# Patient Record
Sex: Female | Born: 1980 | Race: White | Hispanic: No | Smoking: Former smoker
Health system: Southern US, Community
[De-identification: ages and names within clinical notes are randomized; demographics above are authoritative.]

## PROBLEM LIST (undated history)

## (undated) DIAGNOSIS — J302 Other seasonal allergic rhinitis: Secondary | ICD-10-CM

## (undated) DIAGNOSIS — IMO0001 Reserved for inherently not codable concepts without codable children: Secondary | ICD-10-CM

## (undated) DIAGNOSIS — D6851 Activated protein C resistance: Secondary | ICD-10-CM

## (undated) DIAGNOSIS — D279 Benign neoplasm of unspecified ovary: Secondary | ICD-10-CM

## (undated) DIAGNOSIS — D759 Disease of blood and blood-forming organs, unspecified: Secondary | ICD-10-CM

## (undated) DIAGNOSIS — N809 Endometriosis, unspecified: Secondary | ICD-10-CM

## (undated) DIAGNOSIS — H53469 Homonymous bilateral field defects, unspecified side: Secondary | ICD-10-CM

## (undated) DIAGNOSIS — I639 Cerebral infarction, unspecified: Secondary | ICD-10-CM

## (undated) DIAGNOSIS — Z9889 Other specified postprocedural states: Secondary | ICD-10-CM

## (undated) DIAGNOSIS — R112 Nausea with vomiting, unspecified: Secondary | ICD-10-CM

## (undated) DIAGNOSIS — R55 Syncope and collapse: Secondary | ICD-10-CM

## (undated) DIAGNOSIS — T4145XA Adverse effect of unspecified anesthetic, initial encounter: Secondary | ICD-10-CM

## (undated) DIAGNOSIS — F419 Anxiety disorder, unspecified: Secondary | ICD-10-CM

## (undated) DIAGNOSIS — J189 Pneumonia, unspecified organism: Secondary | ICD-10-CM

## (undated) DIAGNOSIS — T8859XA Other complications of anesthesia, initial encounter: Secondary | ICD-10-CM

## (undated) HISTORY — DX: Endometriosis, unspecified: N80.9

## (undated) HISTORY — DX: Activated protein C resistance: D68.51

## (undated) HISTORY — DX: Benign neoplasm of unspecified ovary: D27.9

## (undated) HISTORY — DX: Homonymous bilateral field defects, unspecified side: H53.469

## (undated) HISTORY — DX: Cerebral infarction, unspecified: I63.9

---
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
## 2002-04-09 HISTORY — PX: BREAST LUMPECTOMY: SHX2

## 2004-02-20 ENCOUNTER — Emergency Department: Payer: Self-pay | Admitting: Emergency Medicine

## 2004-05-08 ENCOUNTER — Ambulatory Visit: Payer: Self-pay | Admitting: Unknown Physician Specialty

## 2005-08-20 ENCOUNTER — Ambulatory Visit: Payer: Self-pay | Admitting: Gynecology

## 2005-12-27 ENCOUNTER — Ambulatory Visit: Payer: Self-pay | Admitting: Gynecology

## 2007-04-10 HISTORY — PX: TONSILLECTOMY: SUR1361

## 2008-01-05 ENCOUNTER — Ambulatory Visit: Payer: Self-pay | Admitting: Obstetrics & Gynecology

## 2008-01-06 ENCOUNTER — Ambulatory Visit: Payer: Self-pay | Admitting: Obstetrics & Gynecology

## 2008-04-09 HISTORY — PX: OTHER SURGICAL HISTORY: SHX169

## 2009-05-25 ENCOUNTER — Ambulatory Visit: Payer: Self-pay | Admitting: Unknown Physician Specialty

## 2009-05-26 ENCOUNTER — Inpatient Hospital Stay: Payer: Self-pay | Admitting: Unknown Physician Specialty

## 2009-06-24 ENCOUNTER — Ambulatory Visit: Payer: Self-pay | Admitting: Pediatrics

## 2010-01-17 ENCOUNTER — Encounter: Payer: Self-pay | Admitting: Cardiovascular Disease

## 2010-04-04 ENCOUNTER — Encounter: Payer: Self-pay | Admitting: Cardiovascular Disease

## 2010-04-06 ENCOUNTER — Encounter: Payer: Self-pay | Admitting: Cardiovascular Disease

## 2010-04-06 ENCOUNTER — Ambulatory Visit
Admission: RE | Admit: 2010-04-06 | Discharge: 2010-04-06 | Payer: Self-pay | Source: Home / Self Care | Attending: Cardiovascular Disease | Admitting: Cardiovascular Disease

## 2010-04-06 DIAGNOSIS — Z8673 Personal history of transient ischemic attack (TIA), and cerebral infarction without residual deficits: Secondary | ICD-10-CM | POA: Insufficient documentation

## 2010-04-09 HISTORY — PX: OTHER SURGICAL HISTORY: SHX169

## 2010-04-20 ENCOUNTER — Ambulatory Visit: Admission: RE | Admit: 2010-04-20 | Discharge: 2010-04-20 | Payer: Self-pay | Source: Home / Self Care

## 2010-04-20 ENCOUNTER — Other Ambulatory Visit: Payer: Self-pay | Admitting: Cardiovascular Disease

## 2010-04-20 ENCOUNTER — Encounter: Payer: Self-pay | Admitting: Cardiovascular Disease

## 2010-05-11 NOTE — Letter (Signed)
Summary: [facility]  [facility]   Imported By: Marylou Mccoy 04/20/2010 15:29:04  _____________________________________________________________________  External Attachment:    Type:   Image     Comment:   External Document
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
## 2010-05-25 NOTE — Consult Note (Signed)
Summary: [facility] Referral Form   [facility] Referral Form   Imported By: Roderic Ovens 05/18/2010 09:57:55  _____________________________________________________________________  External Attachment:    Type:   Image     Comment:   External Document

## 2010-10-20 ENCOUNTER — Encounter: Payer: Self-pay | Admitting: Cardiovascular Disease

## 2011-11-19 ENCOUNTER — Ambulatory Visit: Payer: Self-pay | Admitting: Internal Medicine

## 2011-11-23 ENCOUNTER — Ambulatory Visit: Payer: Self-pay | Admitting: Unknown Physician Specialty

## 2011-12-09 ENCOUNTER — Ambulatory Visit: Payer: Self-pay | Admitting: Internal Medicine

## 2012-04-09 HISTORY — PX: NASAL SINUS SURGERY: SHX719

## 2012-06-25 ENCOUNTER — Emergency Department: Payer: Self-pay

## 2013-04-09 HISTORY — PX: ENDOMETRIAL ABLATION: SHX621

## 2013-07-08 ENCOUNTER — Ambulatory Visit: Payer: Self-pay | Admitting: Obstetrics and Gynecology

## 2013-07-08 LAB — CBC
HCT: 39.9 % (ref 35.0–47.0)
HGB: 13.2 g/dL (ref 12.0–16.0)
MCH: 28.7 pg (ref 26.0–34.0)
MCHC: 33 g/dL (ref 32.0–36.0)
MCV: 87 fL (ref 80–100)
Platelet: 313 10*3/uL (ref 150–440)
RBC: 4.59 10*6/uL (ref 3.80–5.20)
RDW: 13.9 % (ref 11.5–14.5)
WBC: 6.1 10*3/uL (ref 3.6–11.0)

## 2013-07-13 ENCOUNTER — Ambulatory Visit: Payer: Self-pay | Admitting: Obstetrics and Gynecology

## 2013-07-15 LAB — PATHOLOGY REPORT

## 2014-07-31 NOTE — Op Note (Signed)
PATIENT NAME:  Karen Marsh, Karen Marsh MR#:  [id number] DATE OF BIRTH:  September 22, 1980  DATE OF PROCEDURE:  07/13/2013  PROCEDURE: 1.  Laparoscopy with peritoneal biopsies.  2.  Laparoscopic adhesiolysis.  3.  Hysteroscopy, dilation and curettage of the endometrium and NovaSure endometrial ablation.   SURGEON: Sharon Seller, MD.   FIRST ASSISTANT: Cordelia Pen, PA-S.   ANESTHESIA: General endotracheal.   INDICATIONS: The patient is a 34 year old, married, white female, para 2-0-0-2, who presents for surgical evaluation of chronic pelvic pain and menorrhagia. The patient has long standing history of severe dysmenorrhea and deep thrusting dyspareunia. She has heavy periods that are unresponsive to medical therapy including Mirena IUD. The patient is status post RSO for a dermoid cyst. She also had 2 previous cesarean sections.   FINDINGS AT SURGERY: Reveals gynecoid pelvis. The uterus did descend to within 4 cm of the introitus.   Laparoscopy demonstrated dense pelvic adhesions between the uterus and the anterior abdominal wall; these were lysed using the Ace Harmonic scalpel. The peritoneal adhesions also were taken down to restore a somewhat normal anatomy. The bladder was filled with saline to verify integrity. The left tube and ovary were normal. The right tube was normal. There was absence of the right ovary consistent with previous oophorectomy for a dermoid cyst. The cul-de-sac peritoneum was very hyperemic. There was a powder burn implant on the left anterior abdominal wall in the region of some adhesions. Two biopsies were taken in this area.   On hysteroscopy, there was a Secretory type endometrium present. Curettings were sent to pathology. NovaSure ablation did demonstrate an excellent thermal burn.   DESCRIPTION OF PROCEDURE: The patient was brought to the operating room where she was placed in the supine position. General endotracheal anesthesia was induced without difficulty. She was  placed in the dorsal lithotomy position using the bumblebee stirrups. A ChloraPrep and Betadine abdominal, perineal, intravaginal prep and drape was performed in standard fashion. Red Robinson catheter was used to drain 300 mL of urine from the bladder. A Hulka tenaculum was placed onto the anterior lip of the cervix to facilitate uterine manipulation. Vertical incision 5 mm in length was made in the subumbilical region. The Optiview laparoscopic trocar system was used to place a 5 mm port into the abdominal pelvic cavity under direct visualization. No bowel or vascular injury was encountered. A second 5 mm port was placed in the right lower quadrant under direct visualization. The above-noted findings were photo documented. The Ace Harmonic scalpel was used to take down the uterine/anterior abdominal wall adhesions. Bladder was also taken down with filmy adhesion complexes. Anatomy was restored to somewhat normal. The bladder was filled with saline to verify integrity. A powder burn implant on the left anterior abdominal wall was biopsied x 2. These were sent to pathology.   After completing the adhesiolysis and biopsies, after confirming hemostasis and after demonstrating that the upper abdomen was grossly normal, attention was turned to the vaginal part of the procedure. The Hulka tenaculum was removed and a single-tooth tenaculum was placed on the edge of the cervix. The uterus was sounded to 9 cm. The endocervical length was 4 cm. The endocervical canal was dilated using Hanks dilators up to an 8-French caliber. Both smooth and serrated curettes were used to perform the curettage. Hysteroscopy did demonstrate a good sampling of tissue. Following completion of the D and C, the NovaSure ablation instrument was placed intravaginally into the uterus in standard protocol. Cavity test was confirmed intact.  The NovaSure was performed with a duration of 1 minute and 23 seconds. Following completion of the procedure,  the instrument was removed and repeat hysteroscopy verified an excellent thermal effect. Once this was completed, all instrumentation was removed from the vagina. Attention was again turned to the anterior abdominal wall and repeat laparoscopy was performed with confirmation of hemostasis and anatomy restoration as previously noted. The procedure was then terminated with all instrumentation being removed from the abdominopelvic cavity. The pneumoperitoneum was released. The incisions were closed with 4-0 plain suture and Dermabond was placed over the incisions. The patient was then awakened, extubated and taken to the recovery room in satisfactory condition.   ESTIMATED BLOOD LOSS: Less than 25 mL.  IV FLUIDS: 900 mL.   URINE OUTPUT: 20 mL.   ____________________________ Prentice Docker. DeFrancesco, MD mad:aw D: 07/13/2013 09:23:00 ET T: 07/13/2013 09:36:59 ET JOB#: [id number]  cc: Daphine Deutscher A. DeFrancesco, MD, <Dictator> [facility] Prentice Docker DEFRANCESCO MD ELECTRONICALLY SIGNED 07/17/2013 4:43

## 2014-11-01 ENCOUNTER — Encounter: Payer: Self-pay | Admitting: Family Medicine

## 2014-11-01 ENCOUNTER — Ambulatory Visit (INDEPENDENT_AMBULATORY_CARE_PROVIDER_SITE_OTHER): Payer: BLUE CROSS/BLUE SHIELD | Admitting: Family Medicine

## 2014-11-01 VITALS — BP 107/69 | HR 77 | Temp 98.0°F | Resp 16 | Ht 61.0 in | Wt 131.6 lb

## 2014-11-01 DIAGNOSIS — Z7189 Other specified counseling: Secondary | ICD-10-CM

## 2014-11-01 DIAGNOSIS — Z7689 Persons encountering health services in other specified circumstances: Secondary | ICD-10-CM

## 2014-11-01 DIAGNOSIS — H938X2 Other specified disorders of left ear: Secondary | ICD-10-CM | POA: Diagnosis not present

## 2014-11-01 DIAGNOSIS — Z8673 Personal history of transient ischemic attack (TIA), and cerebral infarction without residual deficits: Secondary | ICD-10-CM | POA: Diagnosis not present

## 2014-11-01 DIAGNOSIS — D6851 Activated protein C resistance: Secondary | ICD-10-CM | POA: Diagnosis not present

## 2014-11-01 MED ORDER — OXYMETAZOLINE HCL 0.05 % NA SOLN: 1.0000 | Freq: Every day | NASAL | Status: AC

## 2014-11-01 NOTE — Patient Instructions (Signed)
Ear congestion: Trial of afrin at bedtime. Try for 3 nights.

## 2014-11-01 NOTE — Assessment & Plan Note (Addendum)
Continue aspirin daily. Only residual is no peripheral vision. Pt aware of warning signs and symptoms of stroke.  Lipid panel to stratify risk.

## 2014-11-01 NOTE — Progress Notes (Signed)
Subjective:    Patient ID: Karen Marsh, female    DOB: 03-24-1981, 34 y.o.   MRN: [id number]  HPI: Karen Marsh is a 34 y.o. female presenting on 11/01/2014 for Establish Care   HPI  Pt presents to establish care today. Previous PCP was Vonita Moss at [facility].   Current medical problems:  Endometriosis: Had an ablation with Dr. Darrow Bussing last year. Lighter bleeding and heavy cramping throughout the month. LMP: 7/21- light cycle occasional clots.  Stroke: Pt had an eye exam done and found to no peripheral vision 2011. She saw neurology and they determined she had a stroke at one point (possibly invitro).  Takes daily aspirin Factor V Leiden: Never had a clot. Found after stroke testing. Walks around when traveling. Stays hydrated when traveling. Aware of signs and symptoms of blood clot.  Anxiety: On Cymbalta- managed by Dr. Maryruth Bun- will be sent to Dr. Jenean Lindau in [place].   Ear pain: Seen at [facility] urgent care- given Augmentin and Flonase. Told eustachian tube was blocked. She is still hearing crackling and popping of the L ear.   Last pap smear: 2014- normal. Last done at [facility]. History of abnormal pap in 2002- all others have been normal.  Exercise: No formal exercise. 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   Past Medical History  Diagnosis Date  . Quadrantanopsia     R inferior   . Stroke     In past  . Factor V Leiden mutation   . Endometriosis   . Dermoid cyst of ovary     L   History   Social History  . Marital Status: Married    Spouse Name: N/A  . Number of Children: N/A  . Years of Education: N/A   Occupational History  . Not on file.   Social History Main Topics  . Smoking status: Former Smoker -- 0.25 packs/day for 2 years    Types: Cigarettes  . Smokeless tobacco: Never Used     Comment: quit in 2007   . Alcohol Use: Yes     Comment: 1 glass of wine/week   . Drug Use: No  .  Sexual Activity: Yes    Birth Control/ Protection: Surgical   Other Topics Concern  . Not on file   Social History Narrative   Married; full time; does not get regular exercise.    Family History  Problem Relation Age of Onset  . Diabetes Maternal Grandmother   . Cancer Maternal Grandmother     throat  . Cancer Maternal Grandfather     prostate  . Diabetes Maternal Grandfather   . Parkinson's disease Father    Current Outpatient Prescriptions on File Prior to Visit  Medication Sig  . aspirin 81 MG EC tablet Take 81 mg by mouth daily.    Marland Kitchen ibuprofen (ADVIL,MOTRIN) 800 MG tablet Take 800 mg by mouth daily.     No current facility-administered medications on file prior to visit.    Review of Systems  Constitutional: Negative for fever and chills.  HENT: Positive for ear pain. Negative for congestion, ear discharge and sinus pressure.   Respiratory: Negative for chest tightness, shortness of breath and stridor.   Cardiovascular: Negative for chest pain, palpitations and leg swelling.  Endocrine: Negative for cold intolerance, heat intolerance, polydipsia, polyphagia and polyuria.  Genitourinary: Positive for menstrual problem and pelvic pain (chronic endometriosis). Negative for dysuria, vaginal bleeding, vaginal discharge, vaginal  pain and dyspareunia.  Musculoskeletal: Negative.   Skin: Negative.   Neurological: Negative.  Negative for weakness, light-headedness and numbness.  Psychiatric/Behavioral: Negative.    Per HPI unless specifically indicated above     Objective:    BP 107/69 mmHg  Pulse 77  Temp(Src) 98 F (36.7 C) (Oral)  Resp 16  Ht 5\' 1"  (1.549 m)  Wt 131 lb 9.6 oz (59.693 kg)  BMI 24.88 kg/m2  LMP 10/28/2014  Wt Readings from Last 3 Encounters:  11/01/14 131 lb 9.6 oz (59.693 kg)  04/06/10 131 lb 8 oz (59.648 kg)    Physical Exam  Constitutional: She is oriented to person, place, and time. She appears well-developed and well-nourished.  HENT:   Head: Normocephalic and atraumatic.  Right Ear: Hearing and tympanic membrane normal. Tympanic membrane is not erythematous and not retracted.  Left Ear: Tympanic membrane is retracted. Tympanic membrane is not erythematous and not bulging.  Nose: Right sinus exhibits no maxillary sinus tenderness and no frontal sinus tenderness. Left sinus exhibits no maxillary sinus tenderness and no frontal sinus tenderness.  Neck: Neck supple.  Cardiovascular: Normal rate, regular rhythm and normal heart sounds.  Exam reveals no gallop and no friction rub.   No murmur heard. Pulmonary/Chest: Effort normal and breath sounds normal. She has no wheezes. She exhibits no tenderness.  Abdominal: Soft. Normal appearance and bowel sounds are normal. She exhibits no distension and no mass. There is no tenderness. There is no rebound and no guarding.  Musculoskeletal: Normal range of motion. She exhibits no edema or tenderness.  Lymphadenopathy:    She has no cervical adenopathy.  Neurological: She is alert and oriented to person, place, and time.  Skin: Skin is warm and dry.       Assessment & Plan:   Problem List Items Addressed This Visit      Hematopoietic and Hemostatic   Factor V Leiden mutation    Aware of s/s of DVT.       Relevant Orders   Comprehensive Metabolic Panel (CMET)     Other   History of stroke    Continue aspirin daily. Only residual is no peripheral vision. Pt aware of warning signs and symptoms of stroke.  Lipid panel to stratify risk.       Relevant Orders   Comprehensive Metabolic Panel (CMET)   Lipid Profile    Other Visit Diagnoses    Encounter to establish care    -  Primary    Will need well woman later this year.     Ear congestion, left        Likely residual eustachian tube dysfunction. Trial of decongestants and afrin.        Meds ordered this encounter  Medications  . fluticasone (FLONASE) 50 MCG/ACT nasal spray    Sig: Place into the nose.  Marland Kitchen LORazepam  (ATIVAN) 1 MG tablet    Sig: Take by mouth.  . DULoxetine (CYMBALTA) 30 MG capsule    Sig: Take by mouth daily.    Refill:  0  . oxymetazoline (AFRIN NASAL SPRAY) 0.05 % nasal spray    Sig: Place 1 spray into both nostrils at bedtime. Place 1 spray in the L nostril at bedtime and turn head to the left to allow medication to get into ear. Do for 3 nights only.    Dispense:  30 mL    Refill:  0    Order Specific Question:  Supervising Provider    Answer:  Janeann Forehand [[id number]]      Follow up plan: Return if symptoms worsen or fail to improve.

## 2014-11-01 NOTE — Assessment & Plan Note (Signed)
Aware of s/s of DVT.

## 2015-01-19 ENCOUNTER — Encounter: Payer: Self-pay | Admitting: *Deleted

## 2015-01-19 ENCOUNTER — Emergency Department: Payer: BLUE CROSS/BLUE SHIELD

## 2015-01-19 ENCOUNTER — Emergency Department
Admission: EM | Admit: 2015-01-19 | Discharge: 2015-01-20 | Disposition: A | Discharge status: Home / Self Care | Payer: BLUE CROSS/BLUE SHIELD | Attending: Emergency Medicine | Admitting: Emergency Medicine

## 2015-01-19 DIAGNOSIS — Z3202 Encounter for pregnancy test, result negative: Secondary | ICD-10-CM | POA: Insufficient documentation

## 2015-01-19 DIAGNOSIS — Z79899 Other long term (current) drug therapy: Secondary | ICD-10-CM | POA: Diagnosis not present

## 2015-01-19 DIAGNOSIS — Z7982 Long term (current) use of aspirin: Secondary | ICD-10-CM | POA: Insufficient documentation

## 2015-01-19 DIAGNOSIS — N83202 Unspecified ovarian cyst, left side: Secondary | ICD-10-CM | POA: Diagnosis not present

## 2015-01-19 DIAGNOSIS — R1022 Pelvic and perineal pain left side: Secondary | ICD-10-CM

## 2015-01-19 DIAGNOSIS — R1032 Left lower quadrant pain: Secondary | ICD-10-CM

## 2015-01-19 DIAGNOSIS — Z87891 Personal history of nicotine dependence: Secondary | ICD-10-CM | POA: Insufficient documentation

## 2015-01-19 DIAGNOSIS — R102 Pelvic and perineal pain: Secondary | ICD-10-CM

## 2015-01-19 LAB — BASIC METABOLIC PANEL
ANION GAP: 6 (ref 5–15)
BUN: 12 mg/dL (ref 6–20)
CO2: 27 mmol/L (ref 22–32)
Calcium: 9.4 mg/dL (ref 8.9–10.3)
Chloride: 106 mmol/L (ref 101–111)
Creatinine, Ser: 0.62 mg/dL (ref 0.44–1.00)
GFR calc non Af Amer: >60 mL/min (ref >60)
Glucose, Bld: 112 mg/dL — ABNORMAL HIGH (ref 65–99)
POTASSIUM: 4.3 mmol/L (ref 3.5–5.1)
Sodium: 139 mmol/L (ref 135–145)

## 2015-01-19 LAB — URINALYSIS COMPLETE WITH MICROSCOPIC (ARMC ONLY)
BILIRUBIN URINE: NEGATIVE
Bacteria, UA: NONE SEEN
Glucose, UA: NEGATIVE mg/dL
Hgb urine dipstick: NEGATIVE
Nitrite: NEGATIVE
Protein, ur: NEGATIVE mg/dL
Specific Gravity, Urine: 1.014 (ref 1.005–1.030)
pH: 6 (ref 5.0–8.0)

## 2015-01-19 LAB — WET PREP, GENITAL
CLUE CELLS WET PREP: NONE SEEN
TRICH WET PREP: NONE SEEN
YEAST WET PREP: NONE SEEN

## 2015-01-19 LAB — CBC WITH DIFFERENTIAL/PLATELET
BASOS PCT: 1 %
Basophils Absolute: 0.1 10*3/uL (ref 0–0.1)
Eosinophils Absolute: 0.3 10*3/uL (ref 0–0.7)
Eosinophils Relative: 3 %
HEMATOCRIT: 40 % (ref 35.0–47.0)
HEMOGLOBIN: 13.5 g/dL (ref 12.0–16.0)
LYMPHS PCT: 30 %
Lymphs Abs: 2.9 10*3/uL (ref 1.0–3.6)
MCH: 29.8 pg (ref 26.0–34.0)
MCHC: 33.7 g/dL (ref 32.0–36.0)
MCV: 88.2 fL (ref 80.0–100.0)
Monocytes Absolute: 0.6 10*3/uL (ref 0.2–0.9)
Monocytes Relative: 7 %
NEUTROS PCT: 59 %
Neutro Abs: 5.7 10*3/uL (ref 1.4–6.5)
Platelets: 291 10*3/uL (ref 150–440)
RBC: 4.53 MIL/uL (ref 3.80–5.20)
RDW: 13.2 % (ref 11.5–14.5)
WBC: 9.5 10*3/uL (ref 3.6–11.0)

## 2015-01-19 LAB — POCT PREGNANCY, URINE: Preg Test, Ur: NEGATIVE

## 2015-01-19 NOTE — ED Notes (Signed)
Pt has left lower abd pain for 5 days.  No urinary sx.  No vag bleeding or vag discharge.

## 2015-01-19 NOTE — ED Notes (Signed)
poct pregnancy Negative 

## 2015-01-19 NOTE — ED Provider Notes (Signed)
[facility] Emergency Department Provider Note   ____________________________________________  Time seen: 2030  I have reviewed the triage vital signs and the nursing notes.   HISTORY  Chief Complaint Abdominal Pain   History limited by: Not Limited   HPI Karen Marsh is a 34 y.o. female who presents to the emergency department today with left lower abdominal pain.she states that this is been going on for roughly 5 days. It does come and go. She states that when it does come it becomes sharp and intense. The patient denies any abnormal vaginal discharge. She denies any dysuria or foul odor to her urine. She denies any abnormal vaginal discharge. She does state that she will get ovulation pain which this sort of reminds her of however this does not respond to over-the-counter pain medications like her normal pain. She states she is status post a right oophorectomy.   Past Medical History  Diagnosis Date  . Quadrantanopsia     R inferior   . Stroke [facility])     In past  . Factor V Leiden mutation ([place])   . Endometriosis   . Dermoid cyst of ovary     L    Patient Active Problem List   Diagnosis Date Noted  . Factor V Leiden mutation ([place]) 11/01/2014  . History of stroke 04/06/2010    Past Surgical History  Procedure Laterality Date  . Dilation and curettage of uterus  2000  . Tonsillectomy  2009  . Cesarean section  2002 and 2011  . Breast lumpectomy  2003    R breast  . Oophrectomy Right 2010  . Endometrial ablation  2015    Current Outpatient Rx  Name  Route  Sig  Dispense  Refill  . aspirin 81 MG EC tablet   Oral   Take 81 mg by mouth daily.           . DULoxetine (CYMBALTA) 30 MG capsule   Oral   Take by mouth daily.      0   . fluticasone (FLONASE) 50 MCG/ACT nasal spray   Nasal   Place into the nose.         . ibuprofen (ADVIL,MOTRIN) 800 MG tablet   Oral   Take 800 mg by mouth daily.           Marland Kitchen LORazepam (ATIVAN)  1 MG tablet   Oral   Take by mouth.           Allergies Azithromycin and Codeine  Family History  Problem Relation Age of Onset  . Diabetes Maternal Grandmother   . Cancer Maternal Grandmother     throat  . Cancer Maternal Grandfather     prostate  . Diabetes Maternal Grandfather   . Parkinson's disease Father     Social History Social History  Substance Use Topics  . Smoking status: Former Smoker -- 0.25 packs/day for 2 years    Types: Cigarettes  . Smokeless tobacco: Never Used     Comment: quit in 2007   . Alcohol Use: Yes     Comment: 1 glass of wine/week     Review of Systems  Constitutional: Negative for fever. Cardiovascular: Negative for chest pain. Respiratory: Negative for shortness of breath. Gastrointestinal: left lower quadrant pain. Genitourinary: Negative for dysuria. Musculoskeletal: Negative for back pain. Skin: Negative for rash. Neurological: Negative for headaches, focal weakness or numbness.   10-point ROS otherwise negative.  ____________________________________________   PHYSICAL EXAM:  VITAL SIGNS:  ED Triage Vitals  Enc Vitals Group     BP 01/19/15 2100 117/76 mmHg     Pulse Rate 01/19/15 2100 85     Resp 01/19/15 2100 20     Temp 01/19/15 2100 99.1 F (37.3 C)     Temp Source 01/19/15 2100 Oral     SpO2 01/19/15 2100 99 %     Weight 01/19/15 2100 123 lb (55.792 kg)     Height 01/19/15 2100 5\' 1"  (1.549 m)     Head Cir --      Peak Flow --      Pain Score 01/19/15 2101 7   Constitutional: Alert and oriented. Well appearing and in no distress. Eyes: Conjunctivae are normal. PERRL. Normal extraocular movements. ENT   Head: Normocephalic and atraumatic.   Nose: No congestion/rhinnorhea.   Mouth/Throat: Mucous membranes are moist.   Neck: No stridor. Hematological/Lymphatic/Immunilogical: No cervical lymphadenopathy. Cardiovascular: Normal rate, regular rhythm.  No murmurs, rubs, or gallops. Respiratory:  Normal respiratory effort without tachypnea nor retractions. Breath sounds are clear and equal bilaterally. No wheezes/rales/rhonchi. Gastrointestinal: Soft and minimally tender in the left lower quadrant. No rebound. No guarding. Genitourinary: no external lesions. No blood in the vaginal vault. No abnormal discharge. No CMT. No left adnexal fullness. Positive left adnexal tenderness. Musculoskeletal: Normal range of motion in all extremities. No joint effusions.  No lower extremity tenderness nor edema. Neurologic:  Normal speech and language. No gross focal neurologic deficits are appreciated. Speech is normal.  Skin:  Skin is warm, dry and intact. No rash noted. Psychiatric: Mood and affect are normal. Speech and behavior are normal. Patient exhibits appropriate insight and judgment.  ____________________________________________    LABS (pertinent positives/negatives)  Labs Reviewed  BASIC METABOLIC PANEL - Abnormal; Notable for the following:    Glucose, Bld 112 (*)    All other components within normal limits  URINALYSIS COMPLETEWITH MICROSCOPIC ([facility] ONLY) - Abnormal; Notable for the following:    Color, Urine YELLOW (*)    APPearance CLEAR (*)    Ketones, ur TRACE (*)    Leukocytes, UA TRACE (*)    Squamous Epithelial / LPF 0-5 (*)    All other components within normal limits  CBC WITH DIFFERENTIAL/PLATELET  POC URINE PREG, ED  POCT PREGNANCY, URINE     ____________________________________________   EKG  None  ____________________________________________    RADIOLOGY  Ultrasound pending ____________________________________________   PROCEDURES  Procedure(s) performed: None  Critical Care performed: No  ____________________________________________   INITIAL IMPRESSION / ASSESSMENT AND PLAN / ED COURSE  Pertinent labs & imaging results that were available during my care of the patient were reviewed by me and considered in my medical decision making (see  chart for details).  Patient presented to the emergency department today with concerns for left lower quadrant abdominal pain. Patient is status post right oophorectomy. Blood work does not show any leukocytosis. Additionally urine without any concerning findings. On pelvic exam patient did have some left adnexal tenderness. No CMT. Will plan on obtaining an ultrasound to look for ovarian service person other ovarian pathology.  ____________________________________________   FINAL CLINICAL IMPRESSION(S) / ED DIAGNOSES  Final diagnoses:  Left adnexal tenderness  Left lower quadrant pain     Nance Pear, MD 01/19/15 2338

## 2015-01-20 LAB — CHLAMYDIA/NGC RT PCR (ARMC ONLY)
Chlamydia Tr: NOT DETECTED
N gonorrhoeae: NOT DETECTED

## 2015-01-20 MED ORDER — IBUPROFEN 600 MG PO TABS
600.0000 mg | ORAL_TABLET | Freq: Once | ORAL | Status: AC
  Administered 2015-01-20: 600 mg via ORAL
  Filled 2015-01-20: qty 1

## 2015-01-20 MED ORDER — OXYCODONE-ACETAMINOPHEN 5-325 MG PO TABS
1.0000 | ORAL_TABLET | Freq: Once | ORAL | Status: AC
  Administered 2015-01-20: 1 via ORAL
  Filled 2015-01-20: qty 1

## 2015-01-20 MED ORDER — IBUPROFEN 600 MG PO TABS: 600.0000 mg | ORAL_TABLET | Freq: Three times a day (TID) | ORAL | Status: DC | PRN

## 2015-01-20 MED ORDER — OXYCODONE-ACETAMINOPHEN 5-325 MG PO TABS: 1.0000 | ORAL_TABLET | ORAL | Status: DC | PRN

## 2015-01-20 NOTE — Discharge Instructions (Signed)
1. Take pain medicines as needed (Motrin/Percocet #15). 2. Return to the ER for worsening symptoms, persistent vomiting, heavy vaginal bleeding or other concerns.

## 2015-01-20 NOTE — ED Provider Notes (Signed)
-----------------------------------------   1:42 AM on 01/20/2015 -----------------------------------------  Pelvic ultrasound interpreted per Dr. Gerilyn Nestle: Retroverted uterus with small uterine fibroid. Right ovary surgically absent. Heterogeneous hypoechoic nodule adjacent to the left ovary measuring 1.9 cm. This may represent a hemorrhagic cyst or dermoid. No evidence of ovarian torsion.  Updated patient and her sister ultrasound findings. Will prescribe NSAIDs and analgesia and patient will follow-up with her gynecologist. Strict return precautions given. Both verbalize understanding and agree with plan of care.   Paulette Blanch, MD 01/20/15 [phone]

## 2015-01-20 NOTE — ED Notes (Signed)
Reviewed d/c instructions, prescriptions and follow-up care with pt. Pt verbalized understanding.

## 2015-01-25 ENCOUNTER — Ambulatory Visit (INDEPENDENT_AMBULATORY_CARE_PROVIDER_SITE_OTHER): Payer: BLUE CROSS/BLUE SHIELD | Admitting: Obstetrics and Gynecology

## 2015-01-25 ENCOUNTER — Encounter: Payer: Self-pay | Admitting: Obstetrics and Gynecology

## 2015-01-25 VITALS — BP 116/81 | HR 85 | Ht 61.0 in | Wt 121.4 lb

## 2015-01-25 DIAGNOSIS — Z9889 Other specified postprocedural states: Secondary | ICD-10-CM

## 2015-01-25 DIAGNOSIS — N809 Endometriosis, unspecified: Secondary | ICD-10-CM | POA: Diagnosis not present

## 2015-01-25 DIAGNOSIS — N946 Dysmenorrhea, unspecified: Secondary | ICD-10-CM

## 2015-01-25 MED ORDER — OXYCODONE-ACETAMINOPHEN 5-325 MG PO TABS: 1.0000 | ORAL_TABLET | Freq: Four times a day (QID) | ORAL | Status: DC | PRN

## 2015-01-25 NOTE — Progress Notes (Signed)
Patient ID: Karen Marsh, female   DOB: 1980/11/01, 34 y.o.   MRN: [id number] ER f/u from 01/19/2015 for left side pain now around to back. Also knot noted of left breast (has been tender).  Ultrasound report: Retroverted uterus with small uterine fibroid. Right ovary surgically absent. Heterogeneous hypoechoic nodule adjacent to the left ovary measuring 1.9 cm. This may represent a hemorrhagic cyst or dermoid. No evidence of ovarian torsion.  Patient underwent laparoscopy with peritoneal biopsies, adhesiolysis, hysteroscopy/D&C and NovaSure ablation on 07/23/2013.  Procedures were done because the patient has long history of severe dysmenorrhea and deep thrusting dyspareunia.  She had heavy irregular periods, unresponsive to the Mirena IUD.  The patient previously had Rt Oophorectomy for dermoid cystIn 2009, in addition to 2 previous cesarean section deliveries. Findings from surgery included A gynecoid pelvis, uterine descensus to within 4 cm of the introitus, And a normal endometrial cavity.At laparoscopy, the patient had dense pelvic adhesions between the uterus and the anterior abdominal wall which were lysed with the Harmonic scalpel.  The left tube and ovary appeared normal.  The right tube was normal.  There was no evidence of a right ovary, consistent with history of right oophorectomy.  The cul-de-sac was hyperemic.  There were powder burn implants on the left anterior abdominal wall; biopsies were taken; pathology showed Endometriosis.  Patient states that her pain is intermittently up to 7 out of 10 in intensity.  She is having occasional light menses.  Patient also is complaining of a left breast knot.  Past medical history, past surgical history, problem list, allergies, and medications reviewed.  Review of systems: Per HPI.  OBJECTIVE: BP 116/81 mmHg  Pulse 85  Ht 5\' 1"  (1.549 m)  Wt 121 lb 6 oz (55.055 kg)  BMI 22.95 kg/m2  LMP 01/23/2015 Pleasant, well-appearing female in  no acute distress.  She is alert and oriented. Breasts: Bilaterally symmetric without dominant mass, adenopathy or nipple discharge. Lymph node survey: No supraclavicular or axillary adenopathy. Back: No CVA tenderness. Pelvic exam: External genitalia-normal BUS-normal. Vagina-without discharge; no lesions; good supports. Cervix-no lesions; mild cervical motion tenderness Uterus-midplane, tender 2/4, mobile. Adnexa-left-sided tenderness to/4 without palpable mass. RV-deferred.  IMPRESSION: 1.  Symptomatic endometriosis, worsening. 2.1.9 cm hypoechoic nodule, left adnexa, possibly hemorrhagic cyst versus endometrioma versus dermoid. 3.  Left breast tenderness with normal exam today. 4.  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 5.  Status post endometrial ablation  PLAN: 1. Recommend Depo-Lupron therapy. 2.  Percocet prescription for pain is given. 3.  Continue with Percocet and Motrin for pain relief. 4.  Patient is to consider LAVH LSO as definitive treatment for endometriosis; she is understanding of the increased risks with regard to thromboembolic disease due to factor V Leiden deficiency. 5.  Return in 4 weeks.  Brayton Mars, MD

## 2015-01-25 NOTE — Patient Instructions (Signed)
1.  Percocet prescription is given, 25, no refills. 2.  Continue using Motrin and Percocet as needed for pain relief. 3.  Literature regarding Depo-Lupron is given; recommend looking at [facility] website for details regarding endometriosis therapy. 4.  Return in 4 weeks or when necessary. 5.  Consider LAVH LSO as definitive treatment for endometriosis.

## 2015-01-30 DIAGNOSIS — N809 Endometriosis, unspecified: Secondary | ICD-10-CM | POA: Insufficient documentation

## 2015-01-30 DIAGNOSIS — N946 Dysmenorrhea, unspecified: Secondary | ICD-10-CM | POA: Insufficient documentation

## 2015-01-30 DIAGNOSIS — Z9889 Other specified postprocedural states: Secondary | ICD-10-CM | POA: Insufficient documentation

## 2015-02-08 DIAGNOSIS — R55 Syncope and collapse: Secondary | ICD-10-CM

## 2015-02-08 HISTORY — DX: Syncope and collapse: R55

## 2015-02-09 ENCOUNTER — Other Ambulatory Visit: Payer: BLUE CROSS/BLUE SHIELD

## 2015-02-09 ENCOUNTER — Ambulatory Visit (INDEPENDENT_AMBULATORY_CARE_PROVIDER_SITE_OTHER): Payer: BLUE CROSS/BLUE SHIELD | Admitting: General Surgery

## 2015-02-09 ENCOUNTER — Encounter: Payer: Self-pay | Admitting: General Surgery

## 2015-02-09 VITALS — BP 102/64 | HR 92 | Resp 12 | Ht 61.0 in | Wt 120.0 lb

## 2015-02-09 DIAGNOSIS — N6320 Unspecified lump in the left breast, unspecified quadrant: Secondary | ICD-10-CM

## 2015-02-09 DIAGNOSIS — N63 Unspecified lump in breast: Secondary | ICD-10-CM | POA: Diagnosis not present

## 2015-02-09 NOTE — Patient Instructions (Addendum)
The patient is aware to call back for any questions, concerns or change in breast symptoms. Follow up in 3 months

## 2015-02-09 NOTE — Progress Notes (Signed)
Patient ID: Karen Marsh, female   DOB: September 07, 1980, 34 y.o.   MRN: [id number]  Chief Complaint  Patient presents with  . Other    Left breast    HPI Karen Marsh is a 34 y.o. female here today for a evaluation of left breast mass. She states she noticed it about the last week of September. Her dog had stepped on her breast and she had noticed discomfort in the upper-outer quadrant. Shortly afterwards she became aware of a fullness in the 12:00 position of the breast, away from the area of her original trauma. She states it started out as just tenderness. She feels like it may be slightly larger before her periods than it does after her periods. Denies any injury or trauma.  She does self breast checks every other month. Denies family history of breast cancer. The patient has been having lower abdominal pain, and by report was identified with a 2 cm left ovarian cyst. She had previously undergone an endometrial ablation due to menorrhagia. She hs ben followed by Dr Haskel Khan for her lower abdominal pelvic pain with endometriosis  issues and he has recommended a partial hysterectomy. The patient is reportedly been identified as having a low level of factor V. When questioned about whether she was making use of anticoagulation, she reports that her neurologist had recommended this, but the cardiologist did not. There seemed to be a question between the different services about whether she should or shouldn't, and she elected just to make use of aspirin. In regards to the history of a stroke, there was no clinical evidence of the event, only CT evidence and the timing was unclear. Question of an in utero event was raised.. I'm not sure that the patient has been formally evaluated by hematology.  The patient has no known history of deep venous thrombosis. I personally reviewed the history with the patient. HPI  Past Medical History  Diagnosis Date  . Quadrantanopsia     R inferior   . Stroke  [facility])     In past  . Factor V Leiden mutation (HCC)   . Endometriosis   . Dermoid cyst of ovary     L    Past Surgical History  Procedure Laterality Date  . Dilation and curettage of uterus  2000  . Tonsillectomy  2009  . Cesarean section  2002 and 2011  . Oophrectomy Right 2010  . Endometrial ablation  2015  . Tummy tuck  2012  . Nasal sinus surgery  2014  . Breast lumpectomy Right 2004    R breast/ Dr Evette Cristal    Family History  Problem Relation Age of Onset  . Diabetes Maternal Grandmother   . Cancer Maternal Grandmother     throat  . Cancer Maternal Grandfather     prostate  . Diabetes Maternal Grandfather   . Parkinson's disease Father     Social History Social History  Substance Use Topics  . Smoking status: Former Smoker -- 0.25 packs/day for 2 years    Types: Cigarettes  . Smokeless tobacco: Never Used     Comment: quit in 2007   . Alcohol Use: Yes     Comment: 1 glass of wine/week     Allergies  Allergen Reactions  . Azithromycin Nausea And Vomiting  . Codeine     Current Outpatient Prescriptions  Medication Sig Dispense Refill  . amphetamine-dextroamphetamine (ADDERALL XR) 30 MG 24 hr capsule   0  . aspirin 81 MG  EC tablet Take 81 mg by mouth daily.      . DULoxetine (CYMBALTA) 30 MG capsule Take by mouth daily.  0  . ibuprofen (ADVIL,MOTRIN) 600 MG tablet Take 1 tablet (600 mg total) by mouth every 8 (eight) hours as needed. 15 tablet 0  . loratadine (CLARITIN) 10 MG tablet Take 10 mg by mouth daily.    Marland Kitchen oxyCODONE-acetaminophen (PERCOCET/ROXICET) 5-325 MG tablet Take 1-2 tablets by mouth every 6 (six) hours as needed for severe pain. 25 tablet 0  . zolpidem (AMBIEN) 5 MG tablet   2   No current facility-administered medications for this visit.    Review of Systems Review of Systems  Constitutional: Negative.   Respiratory: Negative.   Cardiovascular: Negative.     Blood pressure 102/64, pulse 92, resp. rate 12, height 5\' 1"  (1.549 m),  weight 120 lb (54.432 kg), last menstrual period 01/23/2015.  Physical Exam Physical Exam  Constitutional: She is oriented to person, place, and time. She appears well-developed and well-nourished.  HENT:  Mouth/Throat: Oropharynx is clear and moist.  Eyes: Conjunctivae are normal. No scleral icterus.  Neck: Neck supple.  Cardiovascular: Normal rate, regular rhythm and normal heart sounds.   Pulmonary/Chest: Effort normal and breath sounds normal. Right breast exhibits no inverted nipple, no mass, no nipple discharge, no skin change and no tenderness. Left breast exhibits tenderness. Left breast exhibits no inverted nipple, no mass, no nipple discharge and no skin change.    Fullness and tenderness at 12 o'clock left breast.  Lymphadenopathy:    She has no cervical adenopathy.    She has no axillary adenopathy.  Neurological: She is alert and oriented to person, place, and time.  Skin: Skin is warm and dry.  Psychiatric: Her behavior is normal.    Data Reviewed Ultrasound examination of the left breast in the area of palpable thickening, 12:00, 3 cm from the nipple showed a hypoechoic area with focal acoustic shadowing measuring 0.6 x 1.07 x 1.08. In the radial view this appeared to be incontinuity with an adjacent duct. No other abnormalities noted within the area. BI-RADS-3.  Assessment    Breast mass likely secondary to acute inflammatory process, low index of suspicion for malignancy.    Plan    Observations is warranted at this time. As she is having minimal discomfort, she's been encouraged to continue monthly self examinations and to avoid daily manipulation.  The patient has accessed ibuprofen, 600 mg tablets. While she's not found relief with when necessary use, she's been encouraged to make use of this 4 times a day for a week to see if this doesn't help resolve some of the soreness and speed resolution of an anticipated inflammatory process. She may have developed this  area and response to changes in hormones, not clearly evident secondary to previous endometrial ablation.  Should she develop any pain or appreciated enlargement in this area, she's been asked to return for early reassessment.    The patient is aware to call back for any questions, concerns or change in breast symptoms. Follow up in 3 months.     PCP:  Steele Sizer Ref Dr Catalina Pizza 02/10/2015, 5:45 PM

## 2015-02-24 ENCOUNTER — Ambulatory Visit: Payer: BLUE CROSS/BLUE SHIELD | Admitting: Obstetrics and Gynecology

## 2015-02-25 ENCOUNTER — Inpatient Hospital Stay: Admission: RE | Admit: 2015-02-25 | Payer: BLUE CROSS/BLUE SHIELD | Source: Ambulatory Visit

## 2015-02-25 ENCOUNTER — Encounter: Payer: Self-pay | Admitting: *Deleted

## 2015-02-25 ENCOUNTER — Other Ambulatory Visit: Payer: BLUE CROSS/BLUE SHIELD

## 2015-02-25 NOTE — Patient Instructions (Signed)
  Your procedure is scheduled on: 03-01-15 (TUESDAY) Report to [place] To find out your arrival time please call [phone] between 1PM - 3PM on 02-28-15(MONDAY)  Remember: Instructions that are not followed completely may result in serious medical risk, up to and including death, or upon the discretion of your surgeon and anesthesiologist your surgery may need to be rescheduled.    _X___ 1. Do not eat food or drink liquids after midnight. No gum chewing or hard candies.     _X___ 2. No Alcohol for 24 hours before or after surgery.   ____ 3. Bring all medications with you on the day of surgery if instructed.    _X___ 4. Notify your doctor if there is any change in your medical condition     (cold, fever, infections).     Do not wear jewelry, make-up, hairpins, clips or nail polish.  Do not wear lotions, powders, or perfumes. You may wear deodorant.  Do not shave 48 hours prior to surgery. Men may shave face and neck.  Do not bring valuables to the hospital.    [facility] is not responsible for any belongings or valuables.               Contacts, dentures or bridgework may not be worn into surgery.  Leave your suitcase in the car. After surgery it may be brought to your room.  For patients admitted to the hospital, discharge time is determined by your  treatment team.   Patients discharged the day of surgery will not be allowed to drive home.   Please read over the following fact sheets that you were given:      _X___ Take these medicines the morning of surgery with A SIP OF WATER:    1. CYMBALTA  2. MAY TAKE LORAZEPAM IF NEEDED DAY OF SURGERY  3.   4.  5.  6.  ____ Fleet Enema (as directed)   _X___ Use CHG Soap as directed  _X___ Use inhalers on the day of surgery-USE ALBUTEROL AT [place]   ____ Stop metformin 2 days prior to surgery    ____ Take 1/2 of usual insulin dose the night before surgery and none on the  morning of surgery.   ____ Stop Coumadin/Plavix/aspirin-CHECK WITH DR HARRIS ABOUT ASPIRIN  _X___ Stop Anti-inflammatories-STOP IBUPROFEN NOW-NO NSAIDS OR ASPIRIN PRODUCTS-TYLENOL/PERCOCET OK   ____ Stop supplements until after surgery.    ____ Bring C-Pap to the hospital.

## 2015-02-28 ENCOUNTER — Encounter: Payer: Self-pay | Admitting: Internal Medicine

## 2015-02-28 ENCOUNTER — Inpatient Hospital Stay: Payer: BLUE CROSS/BLUE SHIELD | Attending: Oncology | Admitting: Internal Medicine

## 2015-02-28 ENCOUNTER — Encounter
Admission: RE | Admit: 2015-02-28 | Discharge: 2015-02-28 | Disposition: A | Discharge status: Home / Self Care | Payer: BLUE CROSS/BLUE SHIELD | Source: Ambulatory Visit | Attending: Anesthesiology | Admitting: Anesthesiology

## 2015-02-28 VITALS — BP 116/82 | HR 87 | Temp 98.1°F | Resp 18 | Ht 62.0 in | Wt 120.8 lb

## 2015-02-28 DIAGNOSIS — N83209 Unspecified ovarian cyst, unspecified side: Secondary | ICD-10-CM | POA: Insufficient documentation

## 2015-02-28 DIAGNOSIS — H5340 Unspecified visual field defects: Secondary | ICD-10-CM | POA: Insufficient documentation

## 2015-02-28 DIAGNOSIS — Z87891 Personal history of nicotine dependence: Secondary | ICD-10-CM | POA: Diagnosis not present

## 2015-02-28 DIAGNOSIS — G8929 Other chronic pain: Secondary | ICD-10-CM | POA: Diagnosis not present

## 2015-02-28 DIAGNOSIS — Z888 Allergy status to other drugs, medicaments and biological substances status: Secondary | ICD-10-CM | POA: Diagnosis not present

## 2015-02-28 DIAGNOSIS — F419 Anxiety disorder, unspecified: Secondary | ICD-10-CM | POA: Insufficient documentation

## 2015-02-28 DIAGNOSIS — R0602 Shortness of breath: Secondary | ICD-10-CM | POA: Diagnosis not present

## 2015-02-28 DIAGNOSIS — Z885 Allergy status to narcotic agent status: Secondary | ICD-10-CM | POA: Diagnosis not present

## 2015-02-28 DIAGNOSIS — N8302 Follicular cyst of left ovary: Secondary | ICD-10-CM | POA: Diagnosis not present

## 2015-02-28 DIAGNOSIS — Z79899 Other long term (current) drug therapy: Secondary | ICD-10-CM | POA: Diagnosis not present

## 2015-02-28 DIAGNOSIS — R102 Pelvic and perineal pain: Secondary | ICD-10-CM | POA: Diagnosis present

## 2015-02-28 DIAGNOSIS — Z8673 Personal history of transient ischemic attack (TIA), and cerebral infarction without residual deficits: Secondary | ICD-10-CM | POA: Insufficient documentation

## 2015-02-28 DIAGNOSIS — Z820 Family history of epilepsy and other diseases of the nervous system: Secondary | ICD-10-CM | POA: Diagnosis not present

## 2015-02-28 DIAGNOSIS — D6851 Activated protein C resistance: Secondary | ICD-10-CM

## 2015-02-28 DIAGNOSIS — Z833 Family history of diabetes mellitus: Secondary | ICD-10-CM | POA: Diagnosis not present

## 2015-02-28 DIAGNOSIS — N838 Other noninflammatory disorders of ovary, fallopian tube and broad ligament: Secondary | ICD-10-CM | POA: Diagnosis not present

## 2015-02-28 LAB — ABO/RH: ABO/RH(D): A POS

## 2015-02-28 LAB — PROTIME-INR
INR: 0.99
Prothrombin Time: 13.3 seconds (ref 11.4–15.0)

## 2015-02-28 LAB — TYPE AND SCREEN
ABO/RH(D): A POS
ANTIBODY SCREEN: NEGATIVE

## 2015-02-28 LAB — APTT: APTT: 30 s (ref 24–36)

## 2015-02-28 NOTE — Progress Notes (Signed)
Pt has had 2 episodes of near syncope she states . She feels bad and sits down and asked someone to get her a drink and then she feels better.  She has her last menstrual cycle 11/11. Since her ablation her periods are much better..  She stopped her ASA on Friday of last week.  She stopped he ibuprofen last week early in the week.

## 2015-02-28 NOTE — Addendum Note (Signed)
Addended by: Roxana Hires on: 02/28/2015 05:46 PM   Modules accepted: Level of Service

## 2015-02-28 NOTE — Progress Notes (Signed)
Cancer Center @ [facility] [phone]  [phone]     ASLYN ARAKI OB: 04-10-1980  MR#: [id number]  AOZ#:[id number]  Patient Care Team: Steele Sizer, MD as PCP - General (Family Medicine) Rona Ravens, MD as Obstetrician (Unknown Physician Specialty) Earline Mayotte, MD as Surgeon (General Surgery)  CHIEF COMPLAINT:  Chief Complaint  Patient presents with  . factor V leiden  factor V Leiden heterozygosity   No history exists.    No flowsheet data found.  HISTORY OF PRESENT ILLNESS Mrs. Polter was accidentally found to have R peripheral visual field deficit during regular physical exam at the age of 50. The workup was consistent with consequences of occipital stroke, and the hypercoagulability workup was performed, showing heterozygosity for factor V Leiden (R506Q mutation December 2011). Additional workup showed no evidence of lupus anticoagulant, anticardiolipin antibodies, protein S and C deficiency, antithrombin III deficiency, homocystine elevation, prothrombin gene mutation. She does not have any history of DVT, both during 2 consecutive pregnancies, or after maxillary antrostomy in 2013. She is referred to our clinic on the very short notice prior to laparoscopic evaluation of left ovarian cyst. She already had a right ovary removed due to dermoid cyst She was previously seen in 2013 by Dr. Chipper Oman reviewed previously available results and confirmed the heterozygous state of factor V Leiden.  INTERVAL HISTORY:  Patient is scheduled to undergo laparoscopic procedure tomorrow for complaints of abdominal pain and findings of ovarian cyst. She stop taking aspirin last week. She denies any recent swelling of lower extremities, pain in lower extremities, shortness of breath, chest pain, cough, weakness or numbness in arms or legs. She also was evaluated recently for swelling of the left breast and will need additional imaging studies.  REVIEW OF  SYSTEMS:    As per HPI. Otherwise, a complete review of systems is negatve.  PAST MEDICAL HISTORY: Past Medical History  Diagnosis Date  . Quadrantanopsia     R inferior   . Factor V Leiden mutation (HCC)   . Endometriosis   . Dermoid cyst of ovary     L  . Anxiety   . Pneumonia   . Shortness of breath dyspnea   . Complication of anesthesia   . PONV (postoperative nausea and vomiting)     AFTER ABLATION  . Seasonal allergies   . Stroke (HCC)     In past-UNSURE WHEN IT HAPPENED-PT WAS TOLD IT COULD HAVE HAPPENED IN THE WOMB-PT HAS NO PERIPHERAL VISION BECAUSE OF THIS  . Blood dyscrasia     FACTOR V LEIDEN  . Pre-syncope 02-2015    PAST SURGICAL HISTORY: Past Surgical History  Procedure Laterality Date  . Dilation and curettage of uterus  2000  . Tonsillectomy  2009  . Cesarean section  2002 and 2011  . Oophrectomy Right 2010  . Endometrial ablation  2015  . Tummy tuck  2012  . Nasal sinus surgery  2014  . Breast lumpectomy Right 2004    R breast/ Dr Evette Cristal    FAMILY HISTORY Family History  Problem Relation Age of Onset  . Diabetes Maternal Grandmother   . Cancer Maternal Grandmother     throat  . Cancer Maternal Grandfather     prostate  . Diabetes Maternal Grandfather   . Parkinson's disease Father     ADVANCED DIRECTIVES:  No flowsheet data found.  HEALTH MAINTENANCE: Social History  Substance Use Topics  . Smoking status: Former Smoker -- 0.25 packs/day for 2  years    Types: Cigarettes    Quit date: 02/24/2002  . Smokeless tobacco: Never Used     Comment: quit in 2007   . Alcohol Use: Yes     Comment: 1 glass of wine/week       Allergies  Allergen Reactions  . Azithromycin Nausea And Vomiting  . Codeine     Current Outpatient Prescriptions  Medication Sig Dispense Refill  . albuterol (PROVENTIL HFA;VENTOLIN HFA) 108 (90 BASE) MCG/ACT inhaler Inhale 2 puffs into the lungs every 6 (six) hours as needed for wheezing or shortness of breath.     . amphetamine-dextroamphetamine (ADDERALL XR) 30 MG 24 hr capsule   0  . aspirin 81 MG EC tablet Take 81 mg by mouth daily.      . DULoxetine (CYMBALTA) 30 MG capsule Take 30 mg by mouth every morning.   0  . ibuprofen (ADVIL,MOTRIN) 600 MG tablet Take 1 tablet (600 mg total) by mouth every 8 (eight) hours as needed. 15 tablet 0  . loratadine (CLARITIN) 10 MG tablet Take 10 mg by mouth daily.    Marland Kitchen LORazepam (ATIVAN) 0.5 MG tablet Take 0.5 mg by mouth as needed for anxiety.    Marland Kitchen oxyCODONE-acetaminophen (PERCOCET/ROXICET) 5-325 MG tablet Take 1-2 tablets by mouth every 6 (six) hours as needed for severe pain. 25 tablet 0  . zolpidem (AMBIEN) 5 MG tablet Take 5 mg by mouth at bedtime.   2   No current facility-administered medications for this visit.    OBJECTIVE:  Filed Vitals:   02/28/15 1551  BP: 116/82  Pulse: 87  Temp: 98.1 F (36.7 C)  Resp: 18     Body mass index is 22.09 kg/(m^2).    ECOG FS:0 - Asymptomatic  PHYSICAL EXAM: Patient preferred not to have physical exam.   LAB RESULTS:  CBC Latest Ref Rng 01/19/2015 07/08/2013  WBC 3.6 - 11.0 K/uL 9.5 6.1  Hemoglobin 12.0 - 16.0 g/dL 62.9 52.8  Hematocrit 41.3 - 47.0 % 40.0 39.9  Platelets 150 - 440 K/uL 291 313    Hospital Outpatient Visit on 02/28/2015  Component Date Value Ref Range Status  . Prothrombin Time 02/28/2015 13.3  11.4 - 15.0 seconds Final  . INR 02/28/2015 0.99   Final  . aPTT 02/28/2015 30  24 - 36 seconds Final  . ABO/RH(D) 02/28/2015 A POS   Final  . Antibody Screen 02/28/2015 NEG   Final  . Sample Expiration 02/28/2015 03/14/2015   Final  . Extend sample reason 02/28/2015 NO TRANSFUSIONS OR PREGNANCY IN THE PAST 3 MONTHS   Final  . ABO/RH(D) 02/28/2015 A POS   Final     STUDIES: US Breast Complete Uni Left Inc Axilla  02/10/2015  Ultrasound examination of the left breast in the area of palpable thickening, 12:00, 3 cm from the nipple showed a hypoechoic area with focal acoustic shadowing  measuring 0.6 x 1.07 x 1.08. In the radial view this appeared to be incontinuity with an adjacent duct. No other abnormalities noted within the area. BI-RADS-3.   ASSESSMENT: Heterozygosity for factor V Leiden-I personally reviewed available medical records with the results of hypercoagulable profile performed in December of 2011(records R scanned in the system) We discussed the fact that the patient's with factor V Leiden deficiency in general have an increased risk of deep venous thrombosis (approximately fivefold), but mostly this applies to patients who are homozygous for this mutation. The patients, who are heterozygous for factor V Leiden mutation likely have  minimal increase in the risk of thrombotic episodes, and as such are supposed to be treated the same way as general population in perioperative period. The only difference in management of Mrs. Grieve is that I would not recommend her to be placed on oral contraceptive pills or hormone replacement therapy, since this potentially could increase her risk of deep venous thrombosis by 10-15 fold as compared to a healthy population not on OCP or HRT.  She does not need to return to our clinic, unless clinical developments suggest the need for additional evaluation or opinion.  Patient expressed understanding and was in agreement with this plan. She also understands that She can call clinic at any time with any questions, concerns, or complaints.    No matching staging information was found for the patient.  Gorden Harms, MD   02/28/2015 5:23 PM

## 2015-02-28 NOTE — Pre-Procedure Instructions (Signed)
Spoke with Dr Laureen Abrahams about pt having Factor V Leiden Mutation. I told him that pt was here now getting PT/ PTT and also that pt is getting an EKG due to having pre-syncopal spells, irregular heartbeat and SOB associated with these spells. I was wandering if pt needed a hematology consult or a medical clearance. Dr Laureen Abrahams said that we did not need a medical or hematology clearance and that a PT/PTT was fine. I addressed the spells that pt has been having and Dr Laureen Abrahams said that if pts EKG came back normal then there is nothing else that needs to be done. Just received EKG and it is NSR

## 2015-03-01 ENCOUNTER — Encounter: Payer: Self-pay | Admitting: *Deleted

## 2015-03-01 ENCOUNTER — Encounter
Admission: RE | Disposition: A | Discharge status: Home / Self Care | Payer: Self-pay | Source: Ambulatory Visit | Attending: Obstetrics & Gynecology

## 2015-03-01 ENCOUNTER — Ambulatory Visit
Admission: RE | Admit: 2015-03-01 | Discharge: 2015-03-01 | Disposition: A | Discharge status: Home / Self Care | Payer: BLUE CROSS/BLUE SHIELD | Source: Ambulatory Visit | Attending: Obstetrics & Gynecology | Admitting: Obstetrics & Gynecology

## 2015-03-01 ENCOUNTER — Ambulatory Visit: Payer: BLUE CROSS/BLUE SHIELD | Admitting: Anesthesiology

## 2015-03-01 DIAGNOSIS — N8302 Follicular cyst of left ovary: Secondary | ICD-10-CM | POA: Insufficient documentation

## 2015-03-01 DIAGNOSIS — N838 Other noninflammatory disorders of ovary, fallopian tube and broad ligament: Secondary | ICD-10-CM | POA: Insufficient documentation

## 2015-03-01 DIAGNOSIS — Z79899 Other long term (current) drug therapy: Secondary | ICD-10-CM | POA: Insufficient documentation

## 2015-03-01 DIAGNOSIS — Z833 Family history of diabetes mellitus: Secondary | ICD-10-CM | POA: Insufficient documentation

## 2015-03-01 DIAGNOSIS — D6851 Activated protein C resistance: Secondary | ICD-10-CM | POA: Insufficient documentation

## 2015-03-01 DIAGNOSIS — Z8673 Personal history of transient ischemic attack (TIA), and cerebral infarction without residual deficits: Secondary | ICD-10-CM | POA: Insufficient documentation

## 2015-03-01 DIAGNOSIS — Z888 Allergy status to other drugs, medicaments and biological substances status: Secondary | ICD-10-CM | POA: Insufficient documentation

## 2015-03-01 DIAGNOSIS — Z820 Family history of epilepsy and other diseases of the nervous system: Secondary | ICD-10-CM | POA: Insufficient documentation

## 2015-03-01 DIAGNOSIS — Z885 Allergy status to narcotic agent status: Secondary | ICD-10-CM | POA: Insufficient documentation

## 2015-03-01 DIAGNOSIS — Z87891 Personal history of nicotine dependence: Secondary | ICD-10-CM | POA: Insufficient documentation

## 2015-03-01 DIAGNOSIS — G8929 Other chronic pain: Secondary | ICD-10-CM | POA: Insufficient documentation

## 2015-03-01 DIAGNOSIS — R102 Pelvic and perineal pain: Secondary | ICD-10-CM | POA: Insufficient documentation

## 2015-03-01 DIAGNOSIS — R0602 Shortness of breath: Secondary | ICD-10-CM | POA: Insufficient documentation

## 2015-03-01 HISTORY — DX: Other seasonal allergic rhinitis: J30.2

## 2015-03-01 HISTORY — DX: Pneumonia, unspecified organism: J18.9

## 2015-03-01 HISTORY — DX: Anxiety disorder, unspecified: F41.9

## 2015-03-01 HISTORY — DX: Other specified postprocedural states: R11.2

## 2015-03-01 HISTORY — DX: Reserved for inherently not codable concepts without codable children: IMO0001

## 2015-03-01 HISTORY — DX: Disease of blood and blood-forming organs, unspecified: D75.9

## 2015-03-01 HISTORY — DX: Other specified postprocedural states: Z98.890

## 2015-03-01 HISTORY — PX: LAPAROSCOPIC OVARIAN CYSTECTOMY: SHX6248

## 2015-03-01 HISTORY — DX: Other complications of anesthesia, initial encounter: T88.59XA

## 2015-03-01 HISTORY — DX: Adverse effect of unspecified anesthetic, initial encounter: T41.45XA

## 2015-03-01 HISTORY — DX: Syncope and collapse: R55

## 2015-03-01 HISTORY — PX: LAPAROSCOPY: SHX197

## 2015-03-01 LAB — CBC
HEMATOCRIT: 41.5 % (ref 35.0–47.0)
Hemoglobin: 13.9 g/dL (ref 12.0–16.0)
MCH: 29.8 pg (ref 26.0–34.0)
MCHC: 33.6 g/dL (ref 32.0–36.0)
MCV: 88.8 fL (ref 80.0–100.0)
Platelets: 296 10*3/uL (ref 150–440)
RBC: 4.67 MIL/uL (ref 3.80–5.20)
RDW: 13.1 % (ref 11.5–14.5)
WBC: 6.3 10*3/uL (ref 3.6–11.0)

## 2015-03-01 LAB — POCT PREGNANCY, URINE: PREG TEST UR: NEGATIVE

## 2015-03-01 SURGERY — LAPAROSCOPY OPERATIVE
Anesthesia: General

## 2015-03-01 MED ORDER — SODIUM CHLORIDE 0.9 % IJ SOLN
INTRAMUSCULAR | Status: AC
  Filled 2015-03-01: qty 3

## 2015-03-01 MED ORDER — SUGAMMADEX SODIUM 200 MG/2ML IV SOLN
INTRAVENOUS | Status: DC | PRN
  Administered 2015-03-01: 109.6 mg via INTRAVENOUS

## 2015-03-01 MED ORDER — BUPIVACAINE HCL (PF) 0.5 % IJ SOLN
INTRAMUSCULAR | Status: DC | PRN
  Administered 2015-03-01: 7 mL

## 2015-03-01 MED ORDER — KETOROLAC TROMETHAMINE 30 MG/ML IJ SOLN
INTRAMUSCULAR | Status: DC | PRN
  Administered 2015-03-01: 30 mg via INTRAVENOUS

## 2015-03-01 MED ORDER — MIDAZOLAM HCL 5 MG/5ML IJ SOLN
INTRAMUSCULAR | Status: DC | PRN
  Administered 2015-03-01: 2 mg via INTRAVENOUS

## 2015-03-01 MED ORDER — PROPOFOL 10 MG/ML IV BOLUS
INTRAVENOUS | Status: DC | PRN
  Administered 2015-03-01: 150 mg via INTRAVENOUS

## 2015-03-01 MED ORDER — LACTATED RINGERS IV SOLN
INTRAVENOUS | Status: DC
  Administered 2015-03-01 (×2): via INTRAVENOUS

## 2015-03-01 MED ORDER — LIDOCAINE HCL (CARDIAC) 20 MG/ML IV SOLN
INTRAVENOUS | Status: DC | PRN
  Administered 2015-03-01: 80 mg via INTRAVENOUS

## 2015-03-01 MED ORDER — ONDANSETRON HCL 4 MG/2ML IJ SOLN
INTRAMUSCULAR | Status: DC | PRN
Start: 2015-03-01 — End: 2015-03-01
  Administered 2015-03-01: 4 mg via INTRAVENOUS

## 2015-03-01 MED ORDER — ROCURONIUM BROMIDE 100 MG/10ML IV SOLN
INTRAVENOUS | Status: DC | PRN
  Administered 2015-03-01: 40 mg via INTRAVENOUS

## 2015-03-01 MED ORDER — FENTANYL CITRATE (PF) 100 MCG/2ML IJ SOLN
25.0000 ug | INTRAMUSCULAR | Status: DC | PRN
  Administered 2015-03-01 (×2): 25 ug via INTRAVENOUS

## 2015-03-01 MED ORDER — FAMOTIDINE 20 MG PO TABS
ORAL_TABLET | ORAL | Status: AC
  Filled 2015-03-01: qty 1

## 2015-03-01 MED ORDER — ROCURONIUM BROMIDE 100 MG/10ML IV SOLN: INTRAVENOUS | Status: DC | PRN

## 2015-03-01 MED ORDER — FENTANYL CITRATE (PF) 100 MCG/2ML IJ SOLN
INTRAMUSCULAR | Status: AC
  Administered 2015-03-01: 25 ug via INTRAVENOUS
  Filled 2015-03-01: qty 2

## 2015-03-01 MED ORDER — BUPIVACAINE HCL (PF) 0.5 % IJ SOLN
INTRAMUSCULAR | Status: AC
  Filled 2015-03-01: qty 30

## 2015-03-01 MED ORDER — DEXAMETHASONE SODIUM PHOSPHATE 10 MG/ML IJ SOLN
INTRAMUSCULAR | Status: DC | PRN
  Administered 2015-03-01: 10 mg via INTRAVENOUS

## 2015-03-01 MED ORDER — FENTANYL CITRATE (PF) 250 MCG/5ML IJ SOLN
INTRAMUSCULAR | Status: DC | PRN
  Administered 2015-03-01: 50 ug via INTRAVENOUS

## 2015-03-01 MED ORDER — PROPOFOL 10 MG/ML IV BOLUS: INTRAVENOUS | Status: DC | PRN

## 2015-03-01 MED ORDER — OXYCODONE-ACETAMINOPHEN 7.5-325 MG PO TABS: 1.0000 | ORAL_TABLET | ORAL | Status: DC | PRN

## 2015-03-01 MED ORDER — FAMOTIDINE 20 MG PO TABS
20.0000 mg | ORAL_TABLET | Freq: Once | ORAL | Status: AC
  Administered 2015-03-01: 20 mg via ORAL

## 2015-03-01 MED ORDER — METOCLOPRAMIDE HCL 5 MG/ML IJ SOLN
INTRAMUSCULAR | Status: DC | PRN
  Administered 2015-03-01: 10 mg via INTRAVENOUS

## 2015-03-01 MED ORDER — ONDANSETRON HCL 4 MG/2ML IJ SOLN: 4.0000 mg | Freq: Once | INTRAMUSCULAR | Status: DC | PRN

## 2015-03-01 MED ORDER — SUCCINYLCHOLINE CHLORIDE 20 MG/ML IJ SOLN: INTRAMUSCULAR | Status: DC | PRN

## 2015-03-01 SURGICAL SUPPLY — 36 items
APL SRG 38 LTWT LNG FL B (MISCELLANEOUS) ×2
APPLICATOR ARISTA FLEXITIP XL (MISCELLANEOUS) ×2 IMPLANT
BLADE SURG SZ11 CARB STEEL (BLADE) ×4 IMPLANT
CANISTER SUCT 1200ML W/VALVE (MISCELLANEOUS) ×4 IMPLANT
CATH ROBINSON RED A/P 16FR (CATHETERS) ×4 IMPLANT
CHLORAPREP W/TINT 26ML (MISCELLANEOUS) ×4 IMPLANT
DRESSING TELFA 4X3 1S ST N-ADH (GAUZE/BANDAGES/DRESSINGS) IMPLANT
ENDOPOUCH RETRIEVER 10 (MISCELLANEOUS) IMPLANT
GAUZE SPONGE NON-WVN 2X2 STRL (MISCELLANEOUS) IMPLANT
GLOVE BIO SURGEON STRL SZ8 (GLOVE) ×10 IMPLANT
GLOVE INDICATOR 7.5 STRL GRN (GLOVE) ×10 IMPLANT
GOWN STRL REUS W/ TWL LRG LVL3 (GOWN DISPOSABLE) ×2 IMPLANT
GOWN STRL REUS W/ TWL XL LVL3 (GOWN DISPOSABLE) ×2 IMPLANT
GOWN STRL REUS W/TWL LRG LVL3 (GOWN DISPOSABLE) ×4
GOWN STRL REUS W/TWL XL LVL3 (GOWN DISPOSABLE) ×4
HEMOSTAT ARISTA ABSORB 1G (Miscellaneous) ×3 IMPLANT
IRRIGATION STRYKERFLOW (MISCELLANEOUS) IMPLANT
IRRIGATOR STRYKERFLOW (MISCELLANEOUS) ×4
IV LACTATED RINGERS 1000ML (IV SOLUTION) IMPLANT
LABEL OR SOLS (LABEL) ×4 IMPLANT
LIQUID BAND (GAUZE/BANDAGES/DRESSINGS) ×4 IMPLANT
NEEDLE VERESS 14GA 120MM (NEEDLE) ×4 IMPLANT
NS IRRIG 500ML POUR BTL (IV SOLUTION) ×4 IMPLANT
PACK GYN LAPAROSCOPIC (MISCELLANEOUS) ×4 IMPLANT
PAD PREP 24X41 OB/GYN DISP (PERSONAL CARE ITEMS) ×4 IMPLANT
SCISSORS METZENBAUM CVD 33 (INSTRUMENTS) ×2 IMPLANT
SHEARS HARMONIC ACE PLUS 36CM (ENDOMECHANICALS) ×2 IMPLANT
SLEEVE ENDOPATH XCEL 5M (ENDOMECHANICALS) ×2 IMPLANT
SPONGE VERSALON 2X2 STRL (MISCELLANEOUS)
STRAP SAFETY BODY (MISCELLANEOUS) ×4 IMPLANT
SUT VIC AB 2-0 UR6 27 (SUTURE) IMPLANT
SUT VIC AB 4-0 PS2 18 (SUTURE) IMPLANT
SYRINGE 10CC LL (SYRINGE) ×4 IMPLANT
TROCAR ENDO BLADELESS 11MM (ENDOMECHANICALS) ×2 IMPLANT
TROCAR XCEL NON-BLD 5MMX100MML (ENDOMECHANICALS) ×4 IMPLANT
TUBING INSUFFLATOR HI FLOW (MISCELLANEOUS) ×4 IMPLANT

## 2015-03-01 NOTE — Anesthesia Preprocedure Evaluation (Addendum)
Anesthesia Evaluation  Patient identified by MRN, date of birth, ID band Patient awake    Reviewed: Allergy & Precautions, H&P , NPO status , Patient's Chart, lab work & pertinent test results, reviewed documented beta blocker date and time   History of Anesthesia Complications (+) PONV and history of anesthetic complications  Airway Mallampati: II  TM Distance: >3 FB Neck ROM: full    Dental  (+) Teeth Intact   Pulmonary neg pulmonary ROS, shortness of breath, pneumonia, resolved, Current Smoker, former smoker,    Pulmonary exam normal        Cardiovascular negative cardio ROS Normal cardiovascular exam Rhythm:regular Rate:Normal     Neuro/Psych Cleared by hematology after Factor 5 mutation with previous hx of cva with loss of peripheral vision.  Condition otherwise stable.  JA CVA, Residual Symptoms negative neurological ROS  negative psych ROS   GI/Hepatic negative GI ROS, Neg liver ROS,   Endo/Other  negative endocrine ROS  Renal/GU negative Renal ROS  negative genitourinary   Musculoskeletal   Abdominal   Peds  Hematology negative hematology ROS (+) Blood dyscrasia, ,   Anesthesia Other Findings   Reproductive/Obstetrics negative OB ROS                            Anesthesia Physical Anesthesia Plan  ASA: III  Anesthesia Plan: General ETT   Post-op Pain Management:    Induction:   Airway Management Planned:   Additional Equipment:   Intra-op Plan:   Post-operative Plan:   Informed Consent: I have reviewed the patients History and Physical, chart, labs and discussed the procedure including the risks, benefits and alternatives for the proposed anesthesia with the patient or authorized representative who has indicated his/her understanding and acceptance.     Plan Discussed with: CRNA  Anesthesia Plan Comments:        Anesthesia Quick Evaluation

## 2015-03-01 NOTE — Anesthesia Procedure Notes (Signed)
Procedure Name: Intubation Date/Time: 03/01/2015 10:06 AM Performed by: Delaney Meigs Pre-anesthesia Checklist: Patient identified, Emergency Drugs available, Suction available, Patient being monitored and Timeout performed Patient Re-evaluated:Patient Re-evaluated prior to inductionOxygen Delivery Method: Circle system utilized Preoxygenation: Pre-oxygenation with 100% oxygen Intubation Type: IV induction Ventilation: Mask ventilation without difficulty Laryngoscope Size: Mac and 3 Grade View: Grade I Tube type: Oral Tube size: 6.5 mm Number of attempts: 1 Airway Equipment and Method: Stylet Placement Confirmation: ETT inserted through vocal cords under direct vision,  positive ETCO2 and breath sounds checked- equal and bilateral Secured at: 22 cm Tube secured with: Tape Dental Injury: Teeth and Oropharynx as per pre-operative assessment  Comments: Patient with narrow airway attempted first with ett size 7 unable to pass through vocal cords changed to ett size 6 smooth placement, Ilopez, CRNA

## 2015-03-01 NOTE — Op Note (Signed)
  Operative Note   03/01/2015  PRE-OP DIAGNOSIS: Left Ovarian Cyst, Pelvic Pain   POST-OP DIAGNOSIS: same   PROCEDURE: Procedure(s): LAPAROSCOPY OPERATIVE LEFT LAPAROSCOPIC OVARIAN CYSTECTOMY, bilateral tubal cystectomies   SURGEON: Barnett Applebaum, MD, FACOG  ANESTHESIA: Choice   ESTIMATED BLOOD LOSS: 10 mL  COMPLICATIONS: None  DISPOSITION: PACU - hemodynamically stable.  CONDITION: stable  FINDINGS: Laparoscopic survey of the abdomen revealed a grossly normal uterus, liver edge, gallbladder edge and appendix, No intra-abdominal adhesions were noted. Left Ovarian Cyst noted, Corpus Luteal vs Hemorrhagic with no evidence for endometrioma or dermoid; Right Ovary absent.  Bilateral tubal cysts, otherwise no hydrosalpinx;  No evidence for Endometriosis.  PROCEDURE IN DETAIL: The patient was taken to the OR where anesthesia was administed. The patient was positioned in dorsal lithotomy in the [place]. The patient was then examined under anesthesia with the above noted findings. The patient was prepped and draped in the normal sterile fashion and foley catheter was placed. A Graves speculum was placed in the vagina and the anterior lip of the cervix was grasped with a single toothed tenaculum. A Hulka uterine manipulator was then inserted in the uterus. Uterine mobility was found to be satisfactory. The speculum was then removed.  Attention was turned to the patient's abdomen where a 5 mm skin incision was made in the umbilical fold, after injection of local anesthesia. The Veress step needle was carefully introduced into the peritoneal cavity with placement confirmed using the hanging drop technique.  Pneumoperitoneum was obtained. The 5 mm port was then placed under direct visualization with the operative laparoscope.  Trendelenburg positioning.  Additional 22mm trocar was then placed in the RLQ lateral to the inferior epigastric blood vessels under direct visualization with the  laparoscope.  Instrumentation to visualize complete pelvic anatomy performed.  A 53mm trocar was also then placed in the LLQ in similar fashion.  The ovarian cyst is identified and stabilized.  An incision is made with clear-blood tinged fluid noted.  Fluid is aspirated.  Cyst wall is dissected free from the ovarian cortex and removed.  Hemostasis is visualized and assured. Loletta Parish is used also to obtain complete hemostasis.  Pelvic cavity is cleaned with any fluid aspirated.  Instruments and trocars removed, gas expelled, and skin closed with skin adhesive glue.  Instrument, needle, and sponge counts correct x2 at the conclusion of the case.  Pt goes to recovery room in stable condition.

## 2015-03-01 NOTE — Discharge Instructions (Signed)
General Gynecological Post-Operative Instructions You may expect to feel dizzy, weak, and drowsy for as long as 24 hours after receiving the medicine that made you sleep (anesthetic).  Do not drive a car, ride a bicycle, participate in physical activities, or take public transportation until you are done taking narcotic pain medicines or as directed by your doctor.  Do not drink alcohol or take tranquilizers.  Do not take medicine that has not been prescribed by your doctor.  Do not sign important papers or make important decisions while on narcotic pain medicines.  Have a responsible person with you.  CARE OF INCISION  Keep incision clean and dry. Take showers instead of baths until your doctor gives you permission to take baths.  Avoid heavy lifting (more than 10 pounds/4.5 kilograms), pushing, or pulling.  Avoid activities that may risk injury to your surgical site.  No sexual intercourse or placement of anything in the vagina for 1 weeks or as instructed by your doctor. If you have tubes coming from the wound site, check with your doctor regarding appropriate care of the tubes. Only take prescription or over-the-counter medicines  for pain, discomfort, or fever as directed by your doctor. Do not take aspirin. It can make you bleed. Take medicines (antibiotics) that kill germs if they are prescribed for you.  Call the office or go to the ER if:  You feel sick to your stomach (nauseous) and you start to throw up (vomit).  You have trouble eating or drinking.  You have an oral temperature above 101.  You have constipation that is not helped by adjusting diet or increasing fluid intake. Pain medicines are a common cause of constipation.  You have any other concerns. SEEK IMMEDIATE MEDICAL CARE IF:  You have persistent dizziness.  You have difficulty breathing or a congested sounding (croupy) cough.  You have an oral temperature above 102.5, not controlled by medicine.  There is increasing  pain or tenderness near or in the surgical site.    General Anesthesia, Adult, Care After Refer to this sheet in the next few weeks. These instructions provide you with information on caring for yourself after your procedure. Your health care provider may also give you more specific instructions. Your treatment has been planned according to current medical practices, but problems sometimes occur. Call your health care provider if you have any problems or questions after your procedure. WHAT TO EXPECT AFTER THE PROCEDURE After the procedure, it is typical to experience:  Sleepiness.  Nausea and vomiting. HOME CARE INSTRUCTIONS  For the first 24 hours after general anesthesia:  Have a responsible person with you.  Do not drive a car. If you are alone, do not take public transportation.  Do not drink alcohol.  Do not take medicine that has not been prescribed by your health care provider.  Do not sign important papers or make important decisions.  You may resume a normal diet and activities as directed by your health care provider.  Change bandages (dressings) as directed.  If you have questions or problems that seem related to general anesthesia, call the hospital and ask for the anesthetist or anesthesiologist on call. SEEK MEDICAL CARE IF:  You have nausea and vomiting that continue the day after anesthesia.  You develop a rash. SEEK IMMEDIATE MEDICAL CARE IF:   You have difficulty breathing.  You have chest pain.  You have any allergic problems.   This information is not intended to replace advice given to you by  your health care provider. Make sure you discuss any questions you have with your health care provider.   Document Released: 07/02/2000 Document Revised: 04/16/2014 Document Reviewed: 07/25/2011 Elsevier Interactive Patient Education 2016 [place]   1) The drugs that you were given will stay in your system  until tomorrow so for the next 24 hours you should not:  A) Drive an automobile B) Make any legal decisions C) Drink any alcoholic beverage   2) You may resume regular meals tomorrow.  Today it is better to start with liquids and gradually work up to solid foods.  You may eat anything you prefer, but it is better to start with liquids, then soup and crackers, and gradually work up to solid foods.   3) Please notify your doctor immediately if you have any unusual bleeding, trouble breathing, redness and pain at the surgery site, drainage, fever, or pain not relieved by medication.    4) Additional Instructions:        Please contact your physician with any problems or Same Day Surgery at [phone], Monday through Friday 6 am to 4 pm, or [place] at [facility] number at [phone].

## 2015-03-01 NOTE — H&P (Signed)
History and Physical Interval Note:  03/01/2015 8:37 AM  Karen Marsh  has presented today for surgery, with the diagnosis of CHRONIC PELVIC PAIN,LEFT OVARIAN CYST  The various methods of treatment have been discussed with the patient and family. After consideration of risks, benefits and other options for treatment, the patient has consented to  Procedure(s): LAPAROSCOPY OPERATIVE (N/A) LAPAROSCOPIC OVARIAN CYSTECTOMY (Left) as a surgical intervention .  The patient's history has been reviewed, patient examined, no change in status, stable for surgery.  Pt has the following beta blocker history-  Not taking Beta Blocker.  I have reviewed the patient's chart and labs.  Questions were answered to the patient's satisfaction.    HARRIS,ROBERT Eddie Dibbles

## 2015-03-01 NOTE — Transfer of Care (Signed)
Immediate Anesthesia Transfer of Care Note  Patient: Karen Marsh  Procedure(s) Performed: Procedure(s): LAPAROSCOPY OPERATIVE (N/A) LEFT LAPAROSCOPIC OVARIAN CYSTECTOMY, bilateral tubal cystectomies (Left)  Patient Location: PACU  Anesthesia Type:General  Level of Consciousness: sedated  Airway & Oxygen Therapy: Patient Spontanous Breathing and Patient connected to face mask oxygen  Post-op Assessment: Report given to RN and Post -op Vital signs reviewed and stable  Post vital signs: Reviewed and stable  Last Vitals: 99/63 84hr 100% sat 18resp  Filed Vitals:   03/01/15 0902  BP: 109/74  Pulse: 91  Temp: 36.5 C  Resp: 16    Complications: No apparent anesthesia complications

## 2015-03-01 NOTE — Anesthesia Postprocedure Evaluation (Signed)
Anesthesia Post Note  Patient: Karen Marsh  Procedure(s) Performed: Procedure(s) (LRB): LAPAROSCOPY OPERATIVE (N/A) LEFT LAPAROSCOPIC OVARIAN CYSTECTOMY, bilateral tubal cystectomies (Left)  Patient location during evaluation: PACU Anesthesia Type: General Level of consciousness: awake and alert Pain management: pain level controlled Vital Signs Assessment: post-procedure vital signs reviewed and stable Respiratory status: spontaneous breathing, nonlabored ventilation, respiratory function stable and patient connected to nasal cannula oxygen Cardiovascular status: blood pressure returned to baseline and stable Postop Assessment: No signs of nausea or vomiting Anesthetic complications: no    Last Vitals:  Filed Vitals:   03/01/15 1158 03/01/15 1210  BP: 97/72 96/65  Pulse: 81 76  Temp: 36.6 C   Resp: 18 18    Last Pain:  Filed Vitals:   03/01/15 1212  PainSc: 2                  Molli Barrows

## 2015-03-02 ENCOUNTER — Ambulatory Visit: Payer: BLUE CROSS/BLUE SHIELD | Admitting: Obstetrics and Gynecology

## 2015-03-02 LAB — SURGICAL PATHOLOGY

## 2015-03-10 ENCOUNTER — Ambulatory Visit: Payer: BLUE CROSS/BLUE SHIELD | Admitting: Oncology

## 2015-03-14 ENCOUNTER — Encounter: Payer: Self-pay | Admitting: General Surgery

## 2015-03-14 ENCOUNTER — Ambulatory Visit: Payer: BLUE CROSS/BLUE SHIELD | Admitting: Oncology

## 2015-03-23 ENCOUNTER — Encounter: Payer: Self-pay | Admitting: *Deleted

## 2015-03-29 ENCOUNTER — Ambulatory Visit: Payer: BLUE CROSS/BLUE SHIELD | Admitting: Pain Medicine

## 2015-04-06 ENCOUNTER — Encounter: Payer: Self-pay | Admitting: Pain Medicine

## 2015-04-06 ENCOUNTER — Ambulatory Visit (HOSPITAL_BASED_OUTPATIENT_CLINIC_OR_DEPARTMENT_OTHER): Payer: BLUE CROSS/BLUE SHIELD | Admitting: Pain Medicine

## 2015-04-06 ENCOUNTER — Ambulatory Visit
Admission: RE | Admit: 2015-04-06 | Discharge: 2015-04-06 | Disposition: A | Discharge status: Home / Self Care | Payer: BLUE CROSS/BLUE SHIELD | Source: Ambulatory Visit | Attending: Pain Medicine | Admitting: Pain Medicine

## 2015-04-06 ENCOUNTER — Other Ambulatory Visit: Payer: Self-pay | Admitting: Pain Medicine

## 2015-04-06 ENCOUNTER — Other Ambulatory Visit
Admission: RE | Admit: 2015-04-06 | Discharge: 2015-04-06 | Disposition: A | Discharge status: Home / Self Care | Payer: BLUE CROSS/BLUE SHIELD | Source: Ambulatory Visit | Attending: Pain Medicine | Admitting: Pain Medicine

## 2015-04-06 VITALS — BP 124/99 | HR 87 | Temp 97.9°F | Resp 16 | Ht 61.0 in | Wt 114.0 lb

## 2015-04-06 DIAGNOSIS — M545 Low back pain, unspecified: Secondary | ICD-10-CM

## 2015-04-06 DIAGNOSIS — N949 Unspecified condition associated with female genital organs and menstrual cycle: Secondary | ICD-10-CM

## 2015-04-06 DIAGNOSIS — D6851 Activated protein C resistance: Secondary | ICD-10-CM | POA: Diagnosis not present

## 2015-04-06 DIAGNOSIS — H53469 Homonymous bilateral field defects, unspecified side: Secondary | ICD-10-CM | POA: Diagnosis not present

## 2015-04-06 DIAGNOSIS — F419 Anxiety disorder, unspecified: Secondary | ICD-10-CM | POA: Insufficient documentation

## 2015-04-06 DIAGNOSIS — R102 Pelvic and perineal pain: Principal | ICD-10-CM

## 2015-04-06 DIAGNOSIS — M533 Sacrococcygeal disorders, not elsewhere classified: Secondary | ICD-10-CM

## 2015-04-06 DIAGNOSIS — F1190 Opioid use, unspecified, uncomplicated: Secondary | ICD-10-CM | POA: Diagnosis not present

## 2015-04-06 DIAGNOSIS — N83209 Unspecified ovarian cyst, unspecified side: Secondary | ICD-10-CM | POA: Insufficient documentation

## 2015-04-06 DIAGNOSIS — F1120 Opioid dependence, uncomplicated: Secondary | ICD-10-CM | POA: Insufficient documentation

## 2015-04-06 DIAGNOSIS — G8929 Other chronic pain: Secondary | ICD-10-CM | POA: Insufficient documentation

## 2015-04-06 DIAGNOSIS — Z87891 Personal history of nicotine dependence: Secondary | ICD-10-CM | POA: Insufficient documentation

## 2015-04-06 DIAGNOSIS — Z79891 Long term (current) use of opiate analgesic: Secondary | ICD-10-CM | POA: Diagnosis not present

## 2015-04-06 DIAGNOSIS — Z7189 Other specified counseling: Secondary | ICD-10-CM

## 2015-04-06 DIAGNOSIS — Z79899 Other long term (current) drug therapy: Secondary | ICD-10-CM

## 2015-04-06 DIAGNOSIS — Z5181 Encounter for therapeutic drug level monitoring: Secondary | ICD-10-CM

## 2015-04-06 DIAGNOSIS — N736 Female pelvic peritoneal adhesions (postinfective): Secondary | ICD-10-CM | POA: Insufficient documentation

## 2015-04-06 DIAGNOSIS — Z8742 Personal history of other diseases of the female genital tract: Secondary | ICD-10-CM | POA: Insufficient documentation

## 2015-04-06 LAB — COMPREHENSIVE METABOLIC PANEL
ALBUMIN: 4.4 g/dL (ref 3.5–5.0)
ALK PHOS: 56 U/L (ref 38–126)
ALT: 42 U/L (ref 14–54)
ANION GAP: 7 (ref 5–15)
AST: 29 U/L (ref 15–41)
BILIRUBIN TOTAL: 0.4 mg/dL (ref 0.3–1.2)
BUN: 9 mg/dL (ref 6–20)
CALCIUM: 9.3 mg/dL (ref 8.9–10.3)
CO2: 28 mmol/L (ref 22–32)
Chloride: 104 mmol/L (ref 101–111)
Creatinine, Ser: 0.52 mg/dL (ref 0.44–1.00)
GFR calc Af Amer: >60 mL/min (ref >60)
GFR calc non Af Amer: >60 mL/min (ref >60)
GLUCOSE: 135 mg/dL — AB (ref 65–99)
Potassium: 3.7 mmol/L (ref 3.5–5.1)
SODIUM: 139 mmol/L (ref 135–145)
TOTAL PROTEIN: 7.1 g/dL (ref 6.5–8.1)

## 2015-04-06 LAB — MAGNESIUM: Magnesium: 2.1 mg/dL (ref 1.7–2.4)

## 2015-04-06 LAB — SEDIMENTATION RATE: Sed Rate: 5 mm/hr (ref 0–20)

## 2015-04-06 NOTE — Progress Notes (Signed)
Patient's Name: Karen Marsh MRN: [id number] DOB: [birth year] DOS: 04/06/2015  Primary Reason(s) for Visit: Initial Patient Evaluation CC: Pelvic Pain   HPI:   Karen Marsh is a 34 y.o. year old, female patient, who comes today for an initial evaluation. She has History of stroke; Factor V Leiden mutation (HCC); Endometriosis; Severe dysmenorrhea; Status post endometrial ablation; Breast mass, left; Chronic pain; Long term current use of opiate analgesic; Long term prescription opiate use; Opiate use; Opioid dependence, daily use (HCC); Encounter for therapeutic drug level monitoring; Encounter for chronic pain management; and Chronic pelvic pain in female on her problem list.. Her primarily concern today is the Pelvic Pain     The patient comes in today clinics today for the first time referring that her primary pain is the pelvic pain that wraps around her pelvis in a belt-like fashion. Her secondary pain used to be in the left side of her pelvis but this appears to have gone away after the laparoscopy where they removed a left ovarian cyst. She also has pain going up her back to the neck region primarily on the right side.  Today's Pain Score: 7 , clinically she looks like a 2-3/10. Reported level of pain is incompatible with clinical obrservations. This may be secondary to a possible lack of understanding on how the pain scale works. Pain Type: Chronic pain Pain Location: Pelvis (back) Pain Descriptors / Indicators: Aching, Cramping Pain Frequency: Constant (intermittent cramping)  Onset and Duration: Sudden, Gradual and Date of onset: 01/14/2015 Cause of pain: Unknown Severity: Getting worse, NAS-11 at its worse: 9/10, NAS-11 at its best: 4/10, NAS-11 now: 7/10 and NAS-11 on the average: 7/10 Timing: Not influenced by the time of the day Aggravating Factors: Inconsistent Alleviating Factors: Lying down Associated Problems: Day-time cramps, Inability to concentrate and Sweating Quality  of Pain: Aching, Annoying, Cramping, Pressure-like, Uncomfortable and Work related Previous Examinations or Tests: The patient denies Biopsies, bone scans, CPT, CT scans, CT myelograms, discograms, EMG/PNCV, endoscopies, epidurograms, MRI scans, myelograms, nerve blocks, spinal taps, x-rays, nerve conduction test, neurological evaluations, neurosurgical evaluation, orthopedic evaluations, chiropractic evaluation, or psychiatric evaluations. She admits to a laparoscopy and ultrasound 2. Previous Treatments: The patient denies Biofeedback, chiropractic manipulation, cryo-analgesia, epidural steroid injections, facet blocks, hypnotherapy, morphine pump, narcotic medications, physical therapy, pool exercises, radiofrequency, relaxation therapy, spinal cord stimulation, steroid treatments by mouth, stretching exercises, TENS unit, traction or trigger point injections.  Pharmacotherapy Review: The patient's medication management is currently not under our care Side-effects or Adverse reactions: None reported Effectiveness: Described as relatively effective, allowing for increase in activities of daily living (ADL) Onset of action: Within expected pharmacological parameters Duration of action: Within normal limits for medication Peak effect: Timing and results are as within normal expected parameters [place] PMP: Reviewed Hospital associated UDS Results: No results found for: THCU, COCAINSCRNUR, PCPSCRNUR, MDMA, AMPHETMU, METHADONE, ETOH UDS Results: No UDS available, at this time UDS Interpretation: No UDS available, at this time Medication Assessment Form: Not applicable. Initial evaluation. The patient has not received any medications from our practice Treatment compliance: Not applicable. Initial evaluation Substance Use Disorder (SUD) Risk Level: Pending results of Medical Psychology Evaluation for SUD Pharmacologic Plan: Pending ordered tests and/or consults  Allergies: Karen Marsh is allergic to  azithromycin and codeine.  Meds: The patient has a current medication list which includes the following prescription(s): albuterol, amphetamine-dextroamphetamine, aspirin, duloxetine, ibuprofen, loratadine, lorazepam, zolpidem, and oxycodone-acetaminophen. Requested Prescriptions    No prescriptions requested or ordered in this encounter  ROS: Cardiovascular History: Daily Aspirin intake Pulmonary or Respiratory History: Bronchitis Neurological History: Stroke (Residual deficits or weakness: None described.) Psychological-Psychiatric History: Anxiety and Panic Attacks Gastrointestinal History: Constipation Genitourinary History: Negative for nephrolithiasis, hematuria, renal failure or chronic kidney disease Hematological History: Negative for anticoagulant therapy, anemia, bruising or bleeding easily, hemophilia, sickle cell disease or trait, thrombocytopenia or coagulupathies. The patient has a factor V anomaly. (Hypercoagulation). Endocrine History: Negative for diabetes or thyroid disease Rheumatologic History: Negative for lupus, osteoarthritis, rheumatoid arthritis, myositis, polymyositis or fibromyagia Musculoskeletal History: Negative for myasthenia gravis, muscular dystrophy, multiple sclerosis or malignant hyperthermia Work History: Working full time, as a Social worker.  PFSH: Medical:  Karen Marsh  has a past medical history of Quadrantanopsia; Factor V Leiden mutation (HCC); Endometriosis; Dermoid cyst of ovary; Anxiety; Pneumonia; Shortness of breath dyspnea; Complication of anesthesia; PONV (postoperative nausea and vomiting); Seasonal allergies; Stroke [facility]); Blood dyscrasia; and Pre-syncope (02-2015). Family: family history includes Cancer in her maternal grandfather and maternal grandmother; Diabetes in her maternal grandfather and maternal grandmother; Parkinson's disease in her father. Surgical:  has past surgical history that includes Dilation and curettage of uterus  (2000); Tonsillectomy (2009); Cesarean section (2002 and 2011); oophrectomy (Right, 2010); Endometrial ablation (2015); tummy tuck (2012); Nasal sinus surgery (2014); Breast lumpectomy (Right, 2004); laparoscopy (N/A, 03/01/2015); and Laparoscopic ovarian cystectomy (Left, 03/01/2015). Tobacco:  reports that she quit smoking about 13 years ago. Her smoking use included Cigarettes. She has a .5 pack-year smoking history. She has never used smokeless tobacco. Alcohol:  reports that she drinks alcohol. Drug:  reports that she does not use illicit drugs.  Physical Exam: Vitals:  Today's Vitals   04/06/15 1309 04/06/15 1314  BP: 124/99   Pulse: 87   Temp: 97.9 F (36.6 C)   Resp: 16   Height: 5\' 1"  (1.549 m)   Weight: 114 lb (51.71 kg)   SpO2: 96%   PainSc: 7  7   Calculated BMI: Body mass index is 21.55 kg/(m^2). General appearance: alert, cooperative, appears stated age and no distress Eyes: conjunctivae/corneas clear. PERRL, EOM's intact. Fundi benign. Lungs: No evidence respiratory distress, no audible rales or ronchi and no use of accessory muscles of respiration Neck: no adenopathy, no carotid bruit, no JVD, supple, symmetrical, trachea midline and thyroid not enlarged, symmetric, no tenderness/mass/nodules Back: symmetric, no curvature. ROM normal. No CVA tenderness. Extremities: extremities normal, atraumatic, no cyanosis or edema Pulses: 2+ and symmetric Skin: Skin color, texture, turgor normal. No rashes or lesions Neurologic: Grossly normal    Assessment: Encounter Diagnosis:  Primary Diagnosis: Chronic pain [G89.29]  Note: As per protocol, today's visit has been an evaluation only. We have not taken over the patient's controlled substance management.  Plan: Lounette was seen today for pelvic pain.  Diagnoses and all orders for this visit:  Chronic pain -     COMPLETE METABOLIC PANEL WITH GFR -     C-reactive protein -     Magnesium -     Sedimentation rate -      Vitamin B12 -     Vitamin D pnl(25-hydrxy+1,25-dihy)-bld  Long term current use of opiate analgesic -     Drugs of abuse screen w/o alc, rtn urine-sln -     Ambulatory referral to Psychology  Long term prescription opiate use  Opiate use  Opioid dependence, daily use (HCC)  Encounter for therapeutic drug level monitoring  Encounter for chronic pain management  Chronic pelvic pain in female     There  are no Patient Instructions on file for this visit. Medications discontinued today:  Medications Discontinued During This Encounter  Medication Reason  . oxyCODONE-acetaminophen (PERCOCET) 7.5-325 MG tablet Error  . predniSONE (STERAPRED UNI-PAK 21 TAB) 10 MG (21) TBPK tablet Error   Medications administered today:  Ms. Yantz had no medications administered during this visit.  Primary Care Physician: Vonita Moss, MD Location: [facility] Note by: Sydnee Levans. Laban Emperor, M.D, DABA, DABAPM, DABPM, DABIPP, FIPP

## 2015-04-06 NOTE — Progress Notes (Signed)
Safety precautions to be maintained throughout the outpatient stay will include: orient to surroundings, keep bed in low position, maintain call bell within reach at all times, provide assistance with transfer out of bed and ambulation.  

## 2015-04-07 LAB — VITAMIN D PNL(25-HYDRXY+1,25-DIHY)-BLD
VIT D 1 25 DIHYDROXY: 66.7 pg/mL (ref 19.9–79.3)
VIT D 25 HYDROXY: 26.6 ng/mL — AB (ref 30.0–100.0)

## 2015-04-07 LAB — C-REACTIVE PROTEIN

## 2015-04-07 LAB — VITAMIN B12: Vitamin B-12: 260 pg/mL (ref 180–914)

## 2015-04-07 NOTE — Progress Notes (Signed)

## 2015-04-12 LAB — TOXASSURE SELECT 13 (MW), URINE: PDF: 0

## 2015-04-25 ENCOUNTER — Telehealth: Payer: Self-pay | Admitting: Pain Medicine

## 2015-04-25 NOTE — Telephone Encounter (Signed)
Needs to have Valium called in before the MRI  That is sched for 05-02-15 / she also has another question and would like to speak with nurse

## 2015-04-26 ENCOUNTER — Encounter: Payer: Self-pay | Admitting: Pain Medicine

## 2015-04-27 ENCOUNTER — Other Ambulatory Visit: Payer: Self-pay

## 2015-04-27 MED ORDER — DIAZEPAM 5 MG PO TABS: 5.0000 mg | ORAL_TABLET | Freq: Once | ORAL | Status: DC

## 2015-04-27 NOTE — Telephone Encounter (Signed)
Spoke with patient and discussed the issues she was concerned about.  Will have Dr Dossie Arbour order valium and instructed patient to call and make a follow up appointment after her MRI on 05-02-15.  Denied further questions or concerns.

## 2015-05-02 ENCOUNTER — Ambulatory Visit
Admission: RE | Admit: 2015-05-02 | Discharge: 2015-05-02 | Disposition: A | Discharge status: Home / Self Care | Payer: BLUE CROSS/BLUE SHIELD | Source: Ambulatory Visit | Attending: Pain Medicine | Admitting: Pain Medicine

## 2015-05-02 ENCOUNTER — Encounter: Payer: Self-pay | Admitting: General Surgery

## 2015-05-02 DIAGNOSIS — N949 Unspecified condition associated with female genital organs and menstrual cycle: Secondary | ICD-10-CM | POA: Diagnosis present

## 2015-05-02 DIAGNOSIS — G8929 Other chronic pain: Secondary | ICD-10-CM | POA: Diagnosis present

## 2015-05-02 DIAGNOSIS — R102 Pelvic and perineal pain: Secondary | ICD-10-CM

## 2015-05-02 MED ORDER — GADOBENATE DIMEGLUMINE 529 MG/ML IV SOLN
10.0000 mL | Freq: Once | INTRAVENOUS | Status: AC | PRN
  Administered 2015-05-02: 10 mL via INTRAVENOUS

## 2015-05-09 ENCOUNTER — Ambulatory Visit: Payer: BLUE CROSS/BLUE SHIELD | Attending: Pain Medicine | Admitting: Pain Medicine

## 2015-05-09 ENCOUNTER — Encounter: Payer: Self-pay | Admitting: Pain Medicine

## 2015-05-09 VITALS — BP 105/83 | HR 83 | Temp 98.4°F | Resp 16 | Ht 61.0 in | Wt 114.0 lb

## 2015-05-09 DIAGNOSIS — N63 Unspecified lump in breast: Secondary | ICD-10-CM | POA: Diagnosis not present

## 2015-05-09 DIAGNOSIS — Z8673 Personal history of transient ischemic attack (TIA), and cerebral infarction without residual deficits: Secondary | ICD-10-CM | POA: Insufficient documentation

## 2015-05-09 DIAGNOSIS — Z5181 Encounter for therapeutic drug level monitoring: Secondary | ICD-10-CM | POA: Diagnosis not present

## 2015-05-09 DIAGNOSIS — R102 Pelvic and perineal pain: Secondary | ICD-10-CM | POA: Insufficient documentation

## 2015-05-09 DIAGNOSIS — M545 Low back pain: Secondary | ICD-10-CM | POA: Diagnosis not present

## 2015-05-09 DIAGNOSIS — Z79891 Long term (current) use of opiate analgesic: Secondary | ICD-10-CM

## 2015-05-09 DIAGNOSIS — F1190 Opioid use, unspecified, uncomplicated: Secondary | ICD-10-CM | POA: Insufficient documentation

## 2015-05-09 DIAGNOSIS — D682 Hereditary deficiency of other clotting factors: Secondary | ICD-10-CM | POA: Insufficient documentation

## 2015-05-09 DIAGNOSIS — Z87891 Personal history of nicotine dependence: Secondary | ICD-10-CM | POA: Diagnosis not present

## 2015-05-09 DIAGNOSIS — M549 Dorsalgia, unspecified: Secondary | ICD-10-CM | POA: Diagnosis present

## 2015-05-09 DIAGNOSIS — M533 Sacrococcygeal disorders, not elsewhere classified: Secondary | ICD-10-CM | POA: Diagnosis not present

## 2015-05-09 DIAGNOSIS — D6851 Activated protein C resistance: Secondary | ICD-10-CM

## 2015-05-09 DIAGNOSIS — N736 Female pelvic peritoneal adhesions (postinfective): Secondary | ICD-10-CM

## 2015-05-09 DIAGNOSIS — K660 Peritoneal adhesions (postprocedural) (postinfection): Secondary | ICD-10-CM | POA: Diagnosis not present

## 2015-05-09 DIAGNOSIS — G8929 Other chronic pain: Secondary | ICD-10-CM | POA: Diagnosis not present

## 2015-05-09 DIAGNOSIS — N949 Unspecified condition associated with female genital organs and menstrual cycle: Secondary | ICD-10-CM

## 2015-05-09 DIAGNOSIS — N809 Endometriosis, unspecified: Secondary | ICD-10-CM

## 2015-05-09 MED ORDER — GABAPENTIN 100 MG PO CAPS: 100.0000 mg | ORAL_CAPSULE | Freq: Three times a day (TID) | ORAL | Status: DC

## 2015-05-09 NOTE — Progress Notes (Signed)
Patient's Name: Karen Marsh MRN: [id number] DOB: Mar 25, 1981 DOS: 05/09/2015  Primary Reason(s) for Visit: Encounter for evaluation before starting a Medication CC: Back Pain   HPI  Karen Marsh is a 35 y.o. year old, female patient, who returns today as an established patient. 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 Her primarily concern today is the Back Pain   The patient returns to the clinic today after her initial visit. She has already seen the medical psychologist and she was found to be low risk for substance use disorder. In fact, today we talked about several options and she does not want to use any current medications if she can help it. Her primary concern is to find out where this pain is coming from. The x-rays of the SI joint and the lumbar spine were completely benign. The MRI of the pelvis did not show any significant pathology except for evidence of prior surgeries. Today we have offered the patient to start her on gabapentin 100 mg by mouth 3 times a day and to slowly go up from there. She was a little hesitant to starting medications since she indicates that when she would like to do is to find out where the pain is, from. Even though the x-rays of the SI joint and the lumbar spine show no degeneration, we still have that some of this may be  generated in that area, but has not had time to create any changes in joint or bone structures. In other words, we may be dealing with a very early case of lumbar facet disease or SI joint problem.  Physical exam today revealed pain in the center of the lower back with hyperextension and rotation maneuver as well as bilateral SI joint pain with the Patrick maneuver. However, the left side was a lot worse than the right. She describes the pain as being around her pelvis as if it were a belt. She denies this pain being associated with food intake or bowel movements. In fact, she indicates that it is difficult for her to pinpoint what causes it. She recently had some surgery to remove her ovaries and she wonders if they should have removed of uterus as well. I told her that that was not my area of expertise and I offered to refer her for a second opinion to a gynecologist. She accepted this and will be doing that referral today.  Reported Pain Score: 4  Reported level is inconsistent with clinical obrservations. Pain Type: Chronic pain Pain Location:  (back and pelvis at beltline) Pain Descriptors / Indicators: Aching, Cramping Pain Frequency: Constant  Date of Last Visit: 04/06/15 Service Provided on Last Visit: Evaluation  Pharmacotherapy  Medication(s): She is taking some Percocet 5/325 one to 2 tablets by mouth every 6 hours when necessary for the pain, which were not prescribing for her. She indicated that she did not want any and in fact was to try to get off of all of  these pain medications. Onset of action: Within expected pharmacological parameters Time to Peak effect: Timing and results are as within normal expected parameters Analgesic Effect: More than 50% Activity Facilitation: Medication(s) allow patient to sit, stand, walk, and do the basic ADLs Perceived Effectiveness: Described as relatively effective, allowing for increase in activities of daily living (ADL) Side-effects or  Adverse reactions: None reported Duration of action: Within normal limits for medication  Lab Work: Illicit Drugs No results found for: THCU, COCAINSCRNUR, PCPSCRNUR, MDMA, AMPHETMU, METHADONE, ETOH  Inflammation Markers Lab Results  Component Value Date   ESRSEDRATE 5 04/06/2015   CRP <0.5 04/06/2015    Renal Function Lab Results  Component Value Date   BUN 9 04/06/2015   CREATININE 0.52 04/06/2015   GFRAA >60 04/06/2015   GFRNONAA >60 04/06/2015    Hepatic Function Lab Results  Component Value Date   AST 29 04/06/2015   ALT 42 04/06/2015   ALBUMIN 4.4 04/06/2015    Electrolytes Lab Results  Component Value Date   NA 139 04/06/2015   K 3.7 04/06/2015   CL 104 04/06/2015   CALCIUM 9.3 04/06/2015   MG 2.1 04/06/2015    Allergies  Karen Marsh is allergic to azithromycin and codeine.  Meds  The patient has a current medication list which includes the following prescription(s): albuterol, albuterol, amphetamine-dextroamphetamine, aspirin, ibuprofen, loratadine, lorazepam, oxycodone-acetaminophen, zolpidem, and gabapentin.  Current Outpatient Prescriptions on File Prior to Visit  Medication Sig  . albuterol (PROVENTIL HFA;VENTOLIN HFA) 108 (90 BASE) MCG/ACT inhaler Inhale 2 puffs into the lungs every 6 (six) hours as needed for wheezing or shortness of breath.  . amphetamine-dextroamphetamine (ADDERALL XR) 30 MG 24 hr capsule 30 mg daily.   Marland Kitchen aspirin 81 MG EC tablet Take 81 mg by mouth daily.    Marland Kitchen ibuprofen (ADVIL,MOTRIN) 600 MG tablet Take 1 tablet (600 mg total) by mouth every 8 (eight) hours as needed.  . loratadine (CLARITIN) 10 MG tablet Take 10 mg by mouth daily.  Marland Kitchen LORazepam (ATIVAN) 0.5 MG tablet Take 0.5 mg by mouth as needed for anxiety.  Marland Kitchen oxyCODONE-acetaminophen (PERCOCET/ROXICET) 5-325 MG tablet TAKE 1-2 TABLETS BY MOUTH EVERY 6 HOURS AS NEEDED FOR SEVERE PAIN  . zolpidem (AMBIEN) 5 MG tablet Take 5 mg by mouth at bedtime.    No current  facility-administered medications on file prior to visit.    ROS  Constitutional: Afebrile, no chills, well hydrated and well nourished Gastrointestinal: negative Musculoskeletal:negative Neurological: negative Behavioral/Psych: negative  PFSH  Medical:  Ms. Antonino  has a past medical history of Quadrantanopsia; Factor V Leiden mutation (HCC); Endometriosis; Dermoid cyst of ovary; Anxiety; Pneumonia; Shortness of breath dyspnea; Complication of anesthesia; PONV (postoperative nausea and vomiting); Seasonal allergies; Stroke [facility]); Blood dyscrasia; and Pre-syncope (02-2015). Family: family history includes Cancer in her maternal grandfather and maternal grandmother; Diabetes in her maternal grandfather and maternal grandmother; Parkinson's disease in her father. Surgical:  has past surgical history that includes Dilation and curettage of uterus (2000); Tonsillectomy (2009); Cesarean section (2002 and 2011); oophrectomy (Right, 2010); Endometrial ablation (2015); tummy tuck (2012); Nasal sinus surgery (2014); Breast lumpectomy (Right, 2004); laparoscopy (N/A, 03/01/2015); and Laparoscopic ovarian cystectomy (Left, 03/01/2015). Tobacco:  reports that she quit smoking about 13 years ago. Her smoking use included Cigarettes. She has a .5 pack-year smoking history. She has never used smokeless tobacco. Alcohol:  reports that she drinks alcohol. Drug:  reports that she does not use illicit drugs.  Physical Exam  Vitals:  Today's Vitals  05/09/15 1431 05/09/15 1438  BP: 105/83   Pulse: 83   Temp: 98.4 F (36.9 C)   Resp: 16   Height: 5\' 1"  (1.549 m)   Weight: 114 lb (51.71 kg)   SpO2: 100%   PainSc: 4  4   PainLoc: Back     Calculated BMI: Body mass index is 21.55 kg/(m^2).  General appearance: alert, cooperative, appears stated age, mild distress and depressed and tearful. Eyes: PERLA Respiratory: No evidence respiratory distress, no audible rales or ronchi and no use of accessory  muscles of respiration  Cervical Spine Inspection: Normal anatomy Alignment: Symetrical ROM: Adequate  Upper Extremities Inspection: No gross anomalies detected ROM: Adequate Sensory: Normal Motor: Grossly intact  Thoracic Spine Inspection: No gross anomalies detected Alignment: Symetrical ROM: Adequate  Lumbar Spine Inspection: No gross anomalies detected Alignment: Symetrical ROM: Adequate Palpation: Tender Provocative Tests:  Lumbar Hyperextension and rotation test:  Positive positive bilaterally, but with the pain being referred towards the center of the lower back as opposed to ipsilateral. Patrick's Maneuver: Positive positive bilaterally, but with the left being more significant than the right. Gait: WNL  Lower Extremities Inspection: No gross anomalies detected ROM: Adequate Sensory:  Normal Motor: Unremarkable  Toe walk (S1): WNL  Heal walk (L5): WNL  Assessment & Plan  Primary Diagnosis & Pertinent Problem List: The primary encounter diagnosis was Chronic pain. 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  Visit Diagnosis: 1. Chronic pain   2. Long term current use of opiate analgesic   3. Encounter for therapeutic drug level monitoring   4. Chronic pelvic pain in female (Location of Primary Source of Pain) (belt-like around her entire pelvis)   5. Endometriosis   6. Chronic SI joint pain (Left)   7. Chronic SI joint pain (Bilateral)   8. 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   Plan of Care  Pharmacotherapy (Medications Ordered): Meds ordered this encounter  Medications  . gabapentin (NEURONTIN) 100 MG capsule    Sig: Take 1 capsule (100 mg total) by mouth every 8 (eight) hours.    Dispense:  90 capsule    Refill:  0    Do not place this medication, or any other prescription from our practice, on "Automatic Refill". Patient may have prescription filled one day early if pharmacy is closed on scheduled refill date.    Lab-work & Procedure Ordered: Orders Placed This Encounter  Procedures  . SACROILIAC JOINT INJECTINS    Standing Status: Future     Number of Occurrences:      Standing Expiration Date: 05/08/2016    Scheduling Instructions:     Side: Bilateral     Sedation: With Sedation.     Timeframe: ASAA    Order Specific Question:  Where will this procedure be performed?    Answer:  [facility]  . Ambulatory referral to Obstetrics / Gynecology    Referral Priority:  Routine    Referral Type:  Consultation    Referral Reason:  Specialty Services Required  Requested Specialty:  Obstetrics and Gynecology    Number of Visits Requested:  1    Imaging Ordered: AMB REFERRAL TO OB-GYN  Interventional Therapies: Scheduled: Bilateral sacroiliac joint injection under fluoroscopic guidance and IV sedation. PRN Procedures: Bilateral superior hypogastric nerve block.    Referral(s) or Consult(s): Ambulatory referral to gynecology for second opinion on endometriosis and pelvic pain.  Medications administered during this visit: Ms. Kerschner had no medications administered during this visit.  Future Appointments Date Time Provider Department Center  05/10/2015 2:00 PM Earline Mayotte, MD [facility] None  05/19/2015 9:40 AM Delano Metz, MD [facility] None    Primary Care Physician: Vonita Moss, MD Location: [facility] Facility Note by: Sydnee Levans.  Laban Emperor, M.D, DABA, DABAPM, DABPM, DABIPP, FIPP

## 2015-05-09 NOTE — Patient Instructions (Signed)
GENERAL RISKS AND COMPLICATIONS  What are the risk, side effects and possible complications? Generally speaking, most procedures are safe.  However, with any procedure there are risks, side effects, and the possibility of complications.  The risks and complications are dependent upon the sites that are lesioned, or the type of nerve block to be performed.  The closer the procedure is to the spine, the more serious the risks are.  Great care is taken when placing the radio frequency needles, block needles or lesioning probes, but sometimes complications can occur. 1. Infection: Any time there is an injection through the skin, there is a risk of infection.  This is why sterile conditions are used for these blocks.  There are four possible types of infection. 1. Localized skin infection. 2. Central Nervous System Infection-This can be in the form of Meningitis, which can be deadly. 3. Epidural Infections-This can be in the form of an epidural abscess, which can cause pressure inside of the spine, causing compression of the spinal cord with subsequent paralysis. This would require an emergency surgery to decompress, and there are no guarantees that the patient would recover from the paralysis. 4. Discitis-This is an infection of the intervertebral discs.  It occurs in about 1% of discography procedures.  It is difficult to treat and it may lead to surgery.        2. Pain: the needles have to go through skin and soft tissues, will cause soreness.       3. Damage to internal structures:  The nerves to be lesioned may be near blood vessels or    other nerves which can be potentially damaged.       4. Bleeding: Bleeding is more common if the patient is taking blood thinners such as  aspirin, Coumadin, Ticiid, Plavix, etc., or if he/she have some genetic predisposition  such as hemophilia. Bleeding into the spinal canal can cause compression of the spinal  cord with subsequent paralysis.  This would require an  emergency surgery to  decompress and there are no guarantees that the patient would recover from the  paralysis.       5. Pneumothorax:  Puncturing of a lung is a possibility, every time a needle is introduced in  the area of the chest or upper back.  Pneumothorax refers to free air around the  collapsed lung(s), inside of the thoracic cavity (chest cavity).  Another two possible  complications related to a similar event would include: Hemothorax and Chylothorax.   These are variations of the Pneumothorax, where instead of air around the collapsed  lung(s), you may have blood or chyle, respectively.       6. Spinal headaches: They may occur with any procedures in the area of the spine.       7. Persistent CSF (Cerebro-Spinal Fluid) leakage: This is a rare problem, but may occur  with prolonged intrathecal or epidural catheters either due to the formation of a fistulous  track or a dural tear.       8. Nerve damage: By working so close to the spinal cord, there is always a possibility of  nerve damage, which could be as serious as a permanent spinal cord injury with  paralysis.       9. Death:  Although rare, severe deadly allergic reactions known as "Anaphylactic  reaction" can occur to any of the medications used.      10. Worsening of the symptoms:  We can always make thing worse.    What are the chances of something like this happening? Chances of any of this occuring are extremely low.  By statistics, you have more of a chance of getting killed in a motor vehicle accident: while driving to the hospital than any of the above occurring .  Nevertheless, you should be aware that they are possibilities.  In general, it is similar to taking a shower.  Everybody knows that you can slip, hit your head and get killed.  Does that mean that you should not shower again?  Nevertheless always keep in mind that statistics do not mean anything if you happen to be on the wrong side of them.  Even if a procedure has a 1  (one) in a 1,000,000 (million) chance of going wrong, it you happen to be that one..Also, keep in mind that by statistics, you have more of a chance of having something go wrong when taking medications.  Who should not have this procedure? If you are on a blood thinning medication (e.g. Coumadin, Plavix, see list of "Blood Thinners"), or if you have an active infection going on, you should not have the procedure.  If you are taking any blood thinners, please inform your physician.  How should I prepare for this procedure?  Do not eat or drink anything at least six hours prior to the procedure.  Bring a driver with you .  It cannot be a taxi.  Come accompanied by an adult that can drive you back, and that is strong enough to help you if your legs get weak or numb from the local anesthetic.  Take all of your medicines the morning of the procedure with just enough water to swallow them.  If you have diabetes, make sure that you are scheduled to have your procedure done first thing in the morning, whenever possible.  If you have diabetes, take only half of your insulin dose and notify our nurse that you have done so as soon as you arrive at the clinic.  If you are diabetic, but only take blood sugar pills (oral hypoglycemic), then do not take them on the morning of your procedure.  You may take them after you have had the procedure.  Do not take aspirin or any aspirin-containing medications, at least eleven (11) days prior to the procedure.  They may prolong bleeding.  Wear loose fitting clothing that may be easy to take off and that you would not mind if it got stained with Betadine or blood.  Do not wear any jewelry or perfume  Remove any nail coloring.  It will interfere with some of our monitoring equipment.  NOTE: Remember that this is not meant to be interpreted as a complete list of all possible complications.  Unforeseen problems may occur.  BLOOD THINNERS The following drugs  contain aspirin or other products, which can cause increased bleeding during surgery and should not be taken for 2 weeks prior to and 1 week after surgery.  If you should need take something for relief of minor pain, you may take acetaminophen which is found in Tylenol,m Datril, Anacin-3 and Panadol. It is not blood thinner. The products listed below are.  Do not take any of the products listed below in addition to any listed on your instruction sheet.  A.P.C or A.P.C with Codeine Codeine Phosphate Capsules #3 Ibuprofen Ridaura  ABC compound Congesprin Imuran rimadil  Advil Cope Indocin Robaxisal  Alka-Seltzer Effervescent Pain Reliever and Antacid Coricidin or Coricidin-D  Indomethacin Rufen    Alka-Seltzer plus Cold Medicine Cosprin Ketoprofen S-A-C Tablets  Anacin Analgesic Tablets or Capsules Coumadin Korlgesic Salflex  Anacin Extra Strength Analgesic tablets or capsules CP-2 Tablets Lanoril Salicylate  Anaprox Cuprimine Capsules Levenox Salocol  Anexsia-D Dalteparin Magan Salsalate  Anodynos Darvon compound Magnesium Salicylate Sine-off  Ansaid Dasin Capsules Magsal Sodium Salicylate  Anturane Depen Capsules Marnal Soma  APF Arthritis pain formula Dewitt's Pills Measurin Stanback  Argesic Dia-Gesic Meclofenamic Sulfinpyrazone  Arthritis Bayer Timed Release Aspirin Diclofenac Meclomen Sulindac  Arthritis pain formula Anacin Dicumarol Medipren Supac  Analgesic (Safety coated) Arthralgen Diffunasal Mefanamic Suprofen  Arthritis Strength Bufferin Dihydrocodeine Mepro Compound Suprol  Arthropan liquid Dopirydamole Methcarbomol with Aspirin Synalgos  ASA tablets/Enseals Disalcid Micrainin Tagament  Ascriptin Doan's Midol Talwin  Ascriptin A/D Dolene Mobidin Tanderil  Ascriptin Extra Strength Dolobid Moblgesic Ticlid  Ascriptin with Codeine Doloprin or Doloprin with Codeine Momentum Tolectin  Asperbuf Duoprin Mono-gesic Trendar  Aspergum Duradyne Motrin or Motrin IB Triminicin  Aspirin  plain, buffered or enteric coated Durasal Myochrisine Trigesic  Aspirin Suppositories Easprin Nalfon Trillsate  Aspirin with Codeine Ecotrin Regular or Extra Strength Naprosyn Uracel  Atromid-S Efficin Naproxen Ursinus  Auranofin Capsules Elmiron Neocylate Vanquish  Axotal Emagrin Norgesic Verin  Azathioprine Empirin or Empirin with Codeine Normiflo Vitamin E  Azolid Emprazil Nuprin Voltaren  Bayer Aspirin plain, buffered or children's or timed BC Tablets or powders Encaprin Orgaran Warfarin Sodium  Buff-a-Comp Enoxaparin Orudis Zorpin  Buff-a-Comp with Codeine Equegesic Os-Cal-Gesic   Buffaprin Excedrin plain, buffered or Extra Strength Oxalid   Bufferin Arthritis Strength Feldene Oxphenbutazone   Bufferin plain or Extra Strength Feldene Capsules Oxycodone with Aspirin   Bufferin with Codeine Fenoprofen Fenoprofen Pabalate or Pabalate-SF   Buffets II Flogesic Panagesic   Buffinol plain or Extra Strength Florinal or Florinal with Codeine Panwarfarin   Buf-Tabs Flurbiprofen Penicillamine   Butalbital Compound Four-way cold tablets Penicillin   Butazolidin Fragmin Pepto-Bismol   Carbenicillin Geminisyn Percodan   Carna Arthritis Reliever Geopen Persantine   Carprofen Gold's salt Persistin   Chloramphenicol Goody's Phenylbutazone   Chloromycetin Haltrain Piroxlcam   Clmetidine heparin Plaquenil   Cllnoril Hyco-pap Ponstel   Clofibrate Hydroxy chloroquine Propoxyphen         Before stopping any of these medications, be sure to consult the physician who ordered them.  Some, such as Coumadin (Warfarin) are ordered to prevent or treat serious conditions such as "deep thrombosis", "pumonary embolisms", and other heart problems.  The amount of time that you may need off of the medication may also vary with the medication and the reason for which you were taking it.  If you are taking any of these medications, please make sure you notify your pain physician before you undergo any  procedures.         Sacroiliac (SI) Joint Injection Patient Information  Description: The sacroiliac joint connects the scrum (very low back and tailbone) to the ilium (a pelvic bone which also forms half of the hip joint).  Normally this joint experiences very little motion.  When this joint becomes inflamed or unstable low back and or hip and pelvis pain may result.  Injection of this joint with local anesthetics (numbing medicines) and steroids can provide diagnostic information and reduce pain.  This injection is performed with the aid of x-ray guidance into the tailbone area while you are lying on your stomach.   You may experience an electrical sensation down the leg while this is being done.  You may also experience numbness.  We   also may ask if we are reproducing your normal pain during the injection.  Conditions which may be treated SI injection:   Low back, buttock, hip or leg pain  Preparation for the Injection:  1. Do not eat any solid food or dairy products within 6 hours of your appointment.  2. You may drink clear liquids up to 2 hours before appointment.  Clear liquids include water, black coffee, juice or soda.  No milk or cream please. 3. You may take your regular medications, including pain medications with a sip of water before your appointment.  Diabetics should hold regular insulin (if take separately) and take 1/2 normal NPH dose the morning of the procedure.  Carry some sugar containing items with you to your appointment. 4. A driver must accompany you and be prepared to drive you home after your procedure. 5. Bring all of your current medications with you. 6. An IV may be inserted and sedation may be given at the discretion of the physician. 7. A blood pressure cuff, EKG and other monitors will often be applied during the procedure.  Some patients may need to have extra oxygen administered for a short period.  8. You will be asked to provide medical information,  including your allergies, prior to the procedure.  We must know immediately if you are taking blood thinners (like Coumadin/Warfarin) or if you are allergic to IV iodine contrast (dye).  We must know if you could possible be pregnant.  Possible side effects:   Bleeding from needle site  Infection (rare, may require surgery)  Nerve injury (rare)  Numbness & tingling (temporary)  A brief convulsion or seizure  Light-headedness (temporary)  Pain at injection site (several days)  Decreased blood pressure (temporary)  Weakness in the leg (temporary)   Call if you experience:   New onset weakness or numbness of an extremity below the injection site that last more than 8 hours.  Hives or difficulty breathing ( go to the emergency room)  Inflammation or drainage at the injection site  Any new symptoms which are concerning to you  Please note:  Although the local anesthetic injected can often make your back/ hip/ buttock/ leg feel good for several hours after the injections, the pain will likely return.  It takes 3-7 days for steroids to work in the sacroiliac area.  You may not notice any pain relief for at least that one week.  If effective, we will often do a series of three injections spaced 3-6 weeks apart to maximally decrease your pain.  After the initial series, we generally will wait some months before a repeat injection of the same type.  If you have any questions, please call [phone] [facility]

## 2015-05-09 NOTE — Progress Notes (Signed)
Safety precautions to be maintained throughout the outpatient stay will include: orient to surroundings, keep bed in low position, maintain call bell within reach at all times, provide assistance with transfer out of bed and ambulation.  

## 2015-05-10 ENCOUNTER — Encounter: Payer: Self-pay | Admitting: General Surgery

## 2015-05-10 ENCOUNTER — Ambulatory Visit (INDEPENDENT_AMBULATORY_CARE_PROVIDER_SITE_OTHER): Payer: BLUE CROSS/BLUE SHIELD | Admitting: General Surgery

## 2015-05-10 ENCOUNTER — Other Ambulatory Visit: Payer: BLUE CROSS/BLUE SHIELD

## 2015-05-10 ENCOUNTER — Encounter: Payer: Self-pay | Admitting: Pain Medicine

## 2015-05-10 VITALS — BP 122/64 | HR 68 | Resp 12 | Ht 61.0 in | Wt 117.0 lb

## 2015-05-10 DIAGNOSIS — R634 Abnormal weight loss: Secondary | ICD-10-CM | POA: Diagnosis not present

## 2015-05-10 DIAGNOSIS — N63 Unspecified lump in breast: Secondary | ICD-10-CM

## 2015-05-10 DIAGNOSIS — N6320 Unspecified lump in the left breast, unspecified quadrant: Secondary | ICD-10-CM

## 2015-05-10 NOTE — Progress Notes (Signed)
Patient ID: Karen Marsh, female   DOB: [birth year], 35 y.o.   MRN: [id number]  Chief Complaint  Patient presents with  . Other    left breast pain    HPI DAIANNA Marsh is a 35 y.o. female here today for her follow up left breast pain . She states still feels the knot on the top of her breast and has been having a lot of pain/tenderness and swelling in the left breast. Patient had an ovarian cystectomy in November 2016, she is still recovering.  Patient states she lost 16 lbs since September. The patient questioned why her thyroid functions had never been checked.  She reported earlier this month the inferior aspect of the left breast appeared edematous and had a photo of the same. This was occurring about a week ago and has markedly improved since that time.  The patient continues to have diffuse breast pain, somewhat more centrally located than in the rest the breast. Sensitivity to direct pressure is again noted.  No improvement in her chronic painwith the use of regular dosing with Motrin when tried in November 2016.  The patient has undergone ovarian cystectomy since her last visit with improvement in some of her pelvic pain we'll without complete resolution. She has been seen by Dr. Colan Neptune of the pain clinic and is scheduled for an epidural injection.  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 No indication for chronic anticoagulation.  I personally reviewed the patient's history.  HPI  Past Medical History  Diagnosis Date  . Quadrantanopsia     R inferior   . Factor V Leiden mutation (HCC)   . Endometriosis   . Dermoid cyst of ovary     L  . Anxiety   . Pneumonia   . Shortness of breath dyspnea   . Complication of anesthesia   . PONV (postoperative nausea and vomiting)     AFTER ABLATION  . Seasonal allergies   . Stroke (HCC)     In past-UNSURE WHEN IT HAPPENED-PT WAS TOLD IT COULD HAVE HAPPENED IN THE WOMB-PT HAS  NO PERIPHERAL VISION BECAUSE OF THIS  . Blood dyscrasia     FACTOR V LEIDEN  . Pre-syncope 02-2015    Past Surgical History  Procedure Laterality Date  . Dilation and curettage of uterus  2000  . Tonsillectomy  2009  . Cesarean section  2002 and 2011  . Oophrectomy Right 2010  . Endometrial ablation  2015  . Tummy tuck  2012  . Nasal sinus surgery  2014  . Breast lumpectomy Right 2004    R breast/ Dr Evette Cristal  . Laparoscopy N/A 03/01/2015    Procedure: LAPAROSCOPY OPERATIVE;  Surgeon: Nadara Mustard, MD;  Location: [facility] ORS;  Service: Gynecology;  Laterality: N/A;  . Laparoscopic ovarian cystectomy Left 03/01/2015    Procedure: LEFT LAPAROSCOPIC OVARIAN CYSTECTOMY, bilateral tubal cystectomies;  Surgeon: Nadara Mustard, MD;  Location: [facility] ORS;  Service: Gynecology;  Laterality: Left;    Family History  Problem Relation Age of Onset  . Diabetes Maternal Grandmother   . Cancer Maternal Grandmother     throat  . Cancer Maternal Grandfather     prostate  . Diabetes Maternal Grandfather   . Parkinson's disease Father     Social History Social History  Substance Use Topics  . Smoking status: Former Smoker -- 0.25 packs/day for 2 years    Types: Cigarettes  Quit date: 02/24/2002  . Smokeless tobacco: Never Used     Comment: quit in 2007   . Alcohol Use: Yes     Comment: 1 glass of wine/week     Allergies  Allergen Reactions  . Azithromycin Nausea And Vomiting  . Codeine     Current Outpatient Prescriptions  Medication Sig Dispense Refill  . albuterol (PROAIR HFA) 108 (90 Base) MCG/ACT inhaler as needed.    Marland Kitchen albuterol (PROVENTIL HFA;VENTOLIN HFA) 108 (90 BASE) MCG/ACT inhaler Inhale 2 puffs into the lungs every 6 (six) hours as needed for wheezing or shortness of breath.    . amphetamine-dextroamphetamine (ADDERALL XR) 30 MG 24 hr capsule 30 mg daily.   0  . aspirin 81 MG EC tablet Take 81 mg by mouth daily.      . cholecalciferol (VITAMIN D) 1000 units tablet  Take 1,000 Units by mouth daily.    . DULoxetine (CYMBALTA) 60 MG capsule Take 60 mg by mouth daily.    Marland Kitchen ibuprofen (ADVIL,MOTRIN) 600 MG tablet Take 1 tablet (600 mg total) by mouth every 8 (eight) hours as needed. 15 tablet 0  . loratadine (CLARITIN) 10 MG tablet Take 10 mg by mouth daily.    Marland Kitchen LORazepam (ATIVAN) 0.5 MG tablet Take 0.5 mg by mouth as needed for anxiety.    Marland Kitchen oxyCODONE-acetaminophen (PERCOCET/ROXICET) 5-325 MG tablet TAKE 1-2 TABLETS BY MOUTH EVERY 6 HOURS AS NEEDED FOR SEVERE PAIN  0  . zolpidem (AMBIEN) 5 MG tablet Take 5 mg by mouth at bedtime.   2  . gabapentin (NEURONTIN) 100 MG capsule Take 1 capsule (100 mg total) by mouth every 8 (eight) hours. (Patient not taking: Reported on 05/10/2015) 90 capsule 0   No current facility-administered medications for this visit.    Review of Systems Review of Systems  Constitutional: Negative.   Respiratory: Negative.   Cardiovascular: Negative.     Blood pressure 122/64, pulse 68, resp. rate 12, height 5\' 1"  (1.549 m), weight 117 lb (53.071 kg), last menstrual period 04/24/2015.  Physical Exam Physical Exam  Constitutional: She is oriented to person, place, and time. She appears well-developed and well-nourished.  Eyes: Conjunctivae are normal. No scleral icterus.  Neck: Neck supple.  Cardiovascular: Normal rate, regular rhythm and normal heart sounds.   Pulmonary/Chest: Right breast exhibits no inverted nipple, no mass, no nipple discharge, no skin change and no tenderness. Left breast exhibits tenderness (around areola-near 6 o'clock ). Left breast exhibits no inverted nipple, no mass, no nipple discharge and no skin change.    Right breast-scar present from previous biopsy done in 2004.   Lymphadenopathy:    She has no cervical adenopathy.  Neurological: She is alert and oriented to person, place, and time.  Skin: Skin is warm and dry.    Data Reviewed Ultrasound examination of the left breast showed a small simple  cyst at the 9:00 position, 3 cm in the nipple. This measured 0.43 cm diameter. At the 6:00 position, 3 cm the nipple a smoothly marginated anechoic lesion was appreciated measuring up to 0.68 cm. In the 12:00 position, 3 cm from the nipple at the site of the previously noted ultrasound abnormality there is been a significant decrease in size of this multilobulated hypoechoic area to 0.74 x 0.96 x 1.02 cm.BI-RADS-3..  Assessment    Mastalgia without clear etiology, no improvement with nonsteroidals.     Plan    No indication for intervention at this time, especially with the index lesion  at the 12:00 position having shown decrease in size since her last visit.  We'll arrange for T4 and TSH levels to be checked.  We'll plan for reassessment in 6 months. She was encouraged call promptly if the breast edema evident on her recent photograph recurs.      Plan to have thyroid levels checked.   This information has been scribed by Milas Kocher, I read-CMA   PCP: Dr. Doristine Church, Merrily Pew 05/11/2015, 5:16 PM   Addendum: Normal thyroid studies: TSH 0.450 - 4.500 uIU/mL 2.700   T4, Total 4.5 - 12.0 ug/dL 8.8   Resulting Agency [facility]

## 2015-05-10 NOTE — Patient Instructions (Addendum)
Call with any questions or concerns. Will call with results from Thyroid test.  Follow up in July.

## 2015-05-11 DIAGNOSIS — R634 Abnormal weight loss: Secondary | ICD-10-CM | POA: Insufficient documentation

## 2015-05-11 DIAGNOSIS — N6320 Unspecified lump in the left breast, unspecified quadrant: Secondary | ICD-10-CM | POA: Insufficient documentation

## 2015-05-11 LAB — T4 AND TSH
T4 TOTAL: 8.8 ug/dL (ref 4.5–12.0)
TSH: 2.7 u[IU]/mL (ref 0.450–4.500)

## 2015-05-12 ENCOUNTER — Telehealth: Payer: Self-pay

## 2015-05-12 NOTE — Telephone Encounter (Signed)
Notified patient as instructed, patient pleased. Discussed follow-up appointments, patient agrees  

## 2015-05-12 NOTE — Telephone Encounter (Signed)
-----   Message from Robert Bellow, MD sent at 05/11/2015  5:22 PM EST ----- Please notify the patient that her thyroid studies were perfectly normal. Results sent to both Dr. Kenton Kingfisher and her PCP.

## 2015-05-16 ENCOUNTER — Ambulatory Visit: Payer: Self-pay | Admitting: General Surgery

## 2015-05-19 ENCOUNTER — Ambulatory Visit: Payer: BLUE CROSS/BLUE SHIELD | Attending: Pain Medicine | Admitting: Pain Medicine

## 2015-05-19 ENCOUNTER — Encounter: Payer: Self-pay | Admitting: Pain Medicine

## 2015-05-19 VITALS — BP 102/68 | HR 68 | Temp 98.1°F | Resp 18 | Ht 61.0 in | Wt 114.5 lb

## 2015-05-19 DIAGNOSIS — G8929 Other chronic pain: Secondary | ICD-10-CM | POA: Insufficient documentation

## 2015-05-19 DIAGNOSIS — M545 Low back pain: Secondary | ICD-10-CM | POA: Insufficient documentation

## 2015-05-19 DIAGNOSIS — D682 Hereditary deficiency of other clotting factors: Secondary | ICD-10-CM | POA: Insufficient documentation

## 2015-05-19 DIAGNOSIS — N946 Dysmenorrhea, unspecified: Secondary | ICD-10-CM | POA: Insufficient documentation

## 2015-05-19 DIAGNOSIS — M533 Sacrococcygeal disorders, not elsewhere classified: Secondary | ICD-10-CM | POA: Insufficient documentation

## 2015-05-19 DIAGNOSIS — R102 Pelvic and perineal pain: Secondary | ICD-10-CM | POA: Diagnosis present

## 2015-05-19 DIAGNOSIS — N736 Female pelvic peritoneal adhesions (postinfective): Secondary | ICD-10-CM | POA: Diagnosis not present

## 2015-05-19 DIAGNOSIS — Z8673 Personal history of transient ischemic attack (TIA), and cerebral infarction without residual deficits: Secondary | ICD-10-CM | POA: Insufficient documentation

## 2015-05-19 DIAGNOSIS — N949 Unspecified condition associated with female genital organs and menstrual cycle: Secondary | ICD-10-CM

## 2015-05-19 MED ORDER — FENTANYL CITRATE (PF) 100 MCG/2ML IJ SOLN
100.0000 ug | INTRAMUSCULAR | Status: AC
  Administered 2015-05-19: 100 ug via INTRAVENOUS

## 2015-05-19 MED ORDER — METHYLPREDNISOLONE ACETATE 80 MG/ML IJ SUSP
INTRAMUSCULAR | Status: AC
  Administered 2015-05-19: 80 mg via INTRA_ARTICULAR
  Filled 2015-05-19: qty 1

## 2015-05-19 MED ORDER — MIDAZOLAM HCL 5 MG/5ML IJ SOLN
5.0000 mg | INTRAMUSCULAR | Status: AC
  Administered 2015-05-19: 5 mg via INTRAVENOUS

## 2015-05-19 MED ORDER — ROPIVACAINE HCL 2 MG/ML IJ SOLN
INTRAMUSCULAR | Status: AC
  Filled 2015-05-19: qty 10

## 2015-05-19 MED ORDER — FENTANYL CITRATE (PF) 100 MCG/2ML IJ SOLN
INTRAMUSCULAR | Status: AC
  Administered 2015-05-19: 100 ug via INTRAVENOUS
  Filled 2015-05-19: qty 2

## 2015-05-19 MED ORDER — MIDAZOLAM HCL 5 MG/5ML IJ SOLN
INTRAMUSCULAR | Status: AC
  Administered 2015-05-19: 5 mg via INTRAVENOUS
  Filled 2015-05-19: qty 5

## 2015-05-19 MED ORDER — ROPIVACAINE HCL 2 MG/ML IJ SOLN
9.0000 mL | Freq: Once | INTRAMUSCULAR | Status: AC
  Administered 2015-05-19: 11:00:00

## 2015-05-19 MED ORDER — METHYLPREDNISOLONE ACETATE 80 MG/ML IJ SUSP
80.0000 mg | Freq: Once | INTRAMUSCULAR | Status: AC
  Administered 2015-05-19: 80 mg via INTRA_ARTICULAR

## 2015-05-19 MED ORDER — LIDOCAINE HCL (PF) 1 % IJ SOLN: 10.0000 mL | Freq: Once | INTRAMUSCULAR | Status: DC

## 2015-05-19 MED ORDER — LACTATED RINGERS IV SOLN: 1000.0000 mL | INTRAVENOUS | Status: AC

## 2015-05-19 NOTE — Progress Notes (Signed)
Safety precautions to be maintained throughout the outpatient stay will include: orient to surroundings, keep bed in low position, maintain call bell within reach at all times, provide assistance with transfer out of bed and ambulation.  

## 2015-05-19 NOTE — Progress Notes (Signed)
Patient's Name: Karen Marsh MRN: [id number] DOB: [birth year] DOS: 05/19/2015  Primary Reason(s) for Visit: Interventional Pain Management Treatment. CC: Pelvic Pain   Pre-Procedure Assessment  [name] is a 35 y.o. year old, female patient, seen today for interventional treatment. 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 Her primarily concern today is the Pelvic Pain   Today's Initial Pain Score: 4/10 Reported level of pain is compatible with clinical observation Pain Type: Chronic pain Pain Location: Pelvis Pain Descriptors / Indicators: Aching, Cramping Pain Frequency: Constant  Post-procedure Pain Score: 0-No pain  Date of Last Visit: 05/09/15    Verification of the correct person, correct site (including marking of site), and correct procedure were performed and confirmed by the patient.  Today's Vitals   05/19/15 1149 05/19/15 1155 05/19/15 1205 05/19/15 1215  BP: 106/72 97/68 98/71  102/68  Pulse: 77 76 73 68  Temp:      Resp: 9 14 16 18   Height:      Weight:      SpO2: 99% 98% 100% 97%  PainSc: Asleep 0-No pain 0-No pain 0-No pain  Calculated BMI: Body mass index is 21.65 kg/(m^2). Allergies: She is allergic to azithromycin and codeine.. Primary Diagnosis: Chronic SI joint pain [M53.3,  G89.29]  Procedure  Type: Diagnostic Sacroiliac Joint Steroid Injection Region: Superior Lumbosacral Region Level: PSIS (Posterior Superior Iliac Spine) Laterality: Bilateral  Indications: 1. Chronic SI joint pain (Bilateral)   2. Chronic pelvic pain in female (Location of Primary Source of Pain) (belt-like around her entire pelvis)     In addition, 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  Consent: Secured. Under the influence of no sedatives a written informed consent was obtained, after having provided information on the risks and possible complications. To fulfill our ethical and legal obligations, as recommended by the American Medical Association's Code of Ethics, we have provided information to the patient about our clinical impression; the nature and purpose of the treatment or procedure; the risks, benefits, and possible complications of the intervention; alternatives; the risk(s) and benefit(s) of the alternative treatment(s) or procedure(s); and the risk(s) and benefit(s) of doing nothing. The patient was provided information about the risks and possible complications associated with the procedure. These include, but are not limited to, failure to achieve desired goals, infection, bleeding, organ or nerve damage, allergic reactions, paralysis, and death. In the case of intra- or periarticular procedures these may include, but are not limited to, failure to achieve desired goals, infection, bleeding (hemarthrosis), organ or nerve damage, allergic reactions, and death. In addition, the patient was informed that Medicine is not an exact science; therefore, there is also the possibility of unforeseen risks and possible  complications that may result in a catastrophic outcome. The patient indicated having understood very clearly. We have given the patient no guarantees and we have made no promises. Enough time was given to the patient to ask questions, all of which were answered to the patient's satisfaction.  Pre-Procedure Preparation: Safety Precautions: Allergies reviewed. Appropriate site, procedure, and patient were confirmed by following the Joint Commission's Universal Protocol (UP.01.01.01), in the form of a "Time Out". The patient was asked to confirm marked site and procedure, before commencing. The patient was asked about blood thinners, or active infections, both of which were denied. Patient was assessed for positional comfort and all pressure points were checked before starting procedure. Monitoring:  As per clinic protocol. Infection Control Precautions: Sterile technique used. Standard Universal Precautions were taken as recommended by the Department of [facility] for Disease Control and Prevention (CDC). Standard pre-surgical skin prep was conducted. Respiratory hygiene and cough etiquette was practiced. Hand hygiene observed. Safe injection practices and needle disposal techniques followed. SDV (single dose vial) medications used. Medications properly checked for expiration dates and contaminants. Personal protective equipment (PPE) used: Sterile Radiation-resistant gloves.  Anesthesia, Analgesia, Anxiolysis  Type: Moderate (Conscious) Sedation & Local Anesthesia Local Anesthetic: Lidocaine 1% Route: Intravenous (IV) IV Access: Secured Sedation: Meaningful verbal contact was maintained at all times during the procedure  Indication(s): Analgesia & Anxiolysis  Description of Procedure Process:  Time-out: "Time-out" completed before starting procedure, as per protocol. Position: Prone Target Area: Superior, posterior, aspect of the sacroiliac fissure Approach: Posterior, paraspinal,  ipsilateral approach. Area Prepped: Entire Lower Lumbosacral Region Prepping solution: ChloraPrep (2% chlorhexidine gluconate and 70% isopropyl alcohol) Safety Precautions: Aspiration looking for blood return was conducted prior to all injections. At no point did we inject any substances, as a needle was being advanced. No attempts were made at seeking any paresthesias. Safe injection practices and needle disposal techniques used. Medications properly checked for expiration dates. SDV (single dose vial) medications used.   Description of the Procedure: Protocol guidelines were followed. The patient was placed in position over the procedure table. The target area was identified and the area prepped in the usual manner. Skin & deeper tissues infiltrated with local anesthetic. Appropriate amount of time allowed to pass for local anesthetics to take effect. The procedure needle was advanced under fluoroscopic guidance into the sacroiliac joint until a firm endpoint was obtained. Proper needle placement secured. Negative aspiration confirmed. Solution injected in intermittent fashion, asking for systemic symptoms every 0.5cc of injectate. The needles were then removed and the area cleansed, making sure to leave some of the prepping solution back to take advantage of its long term bactericidal properties. EBL: None Materials & Medications Used:  Needle(s) Used: 22g - 3.5" Spinal Needle(s) Solution Injected: 0.2% PF-Ropivacaine (4ml) + SDV-DepoMedrol 40 mg/ml (54ml)/side. Medications Administered today: We administered fentaNYL, midazolam, methylPREDNISolone acetate, ropivacaine (PF) 2 mg/ml (0.2%), ropivacaine (PF) 2 mg/ml (0.2%), methylPREDNISolone acetate, fentaNYL, and midazolam.Please see chart orders for dosing details.  Imaging Guidance  Type of Imaging Technique: Fluoroscopy Guidance (Spinal) Indication(s): Assistance in needle guidance and placement for procedures requiring needle placement in or near  specific anatomical locations not easily accessible without such assistance. Exposure Time: Please see nurses notes. Contrast: None used. Fluoroscopic Guidance: I was personally present in the fluoroscopy suite, where the patient was placed in position for the procedure, over the fluoroscopy-compatible table. Fluoroscopy was manipulated, using "Tunnel Vision Technique", to obtain the best possible view of the target area, on the  affected side. Parallax error was corrected before commencing the procedure. A "direction-depth-direction" technique was used to introduce the needle under continuous pulsed fluoroscopic guidance. Once the target was reached, antero-posterior, oblique, and lateral fluoroscopic projection views were taken to confirm needle placement in all planes. Permanently recorded images stored by scanning into EMR. Interpretation: No contrast injected. Intraoperative imaging interpretation by performing Physician. Adequate needle placement confirmed. Permanent hardcopy images in multiple planes scanned into the patient's record.  Antibiotic Prophylaxis  Type:  Antibiotics Given (last 72 hours)    None      Indication(s): No indications identified.  Post-operative Assessment  Complications: No immediate post-treatment complications were observed. Disposition: The patient was discharged home, once institutional criteria were met. Return to clinic in 2 weeks for follow-up evaluation and interpretation of results. The patient tolerated the entire procedure well. A repeat set of vitals were taken after the procedure and the patient was kept under observation following institutional policy, for this type of procedure. Post-procedural neurological assessment was performed, showing return to baseline, prior to discharge. The patient was provided with post-procedure discharge instructions, including a section on how to identify potential problems. Should any problems arise concerning this  procedure, the patient was given instructions to immediately contact us, at any time, without hesitation. In any case, we plan to contact the patient by telephone for a follow-up status report regarding this interventional procedure. Comments:  No additional relevant information.  Primary Care Physician: Vonita Moss, MD Location: [facility] Facility Note by: Sydnee Levans. Laban Emperor, M.D, DABA, DABAPM, DABPM, DABIPP, FIPP  Disclaimer:  Medicine is not an exact science. The only guarantee in medicine is that nothing is guaranteed. It is important to note that the decision to proceed with this intervention was based on the information collected from the patient. The Data and conclusions were drawn from the patient's questionnaire, the interview, and the physical examination. Because the information was provided in large part by the patient, it cannot be guaranteed that it has not been purposely or unconsciously manipulated. Every effort has been made to obtain as much relevant data as possible for this evaluation. It is important to note that the conclusions that lead to this procedure are derived in large part from the available data. Always take into account that the treatment will also be dependent on availability of resources and existing treatment guidelines, considered by other Pain Management Practitioners as being common knowledge and practice, at the time of the intervention. For Medico-Legal purposes, it is also important to point out that variation in procedural techniques and pharmacological choices are the acceptable norm. The indications, contraindications, technique, and results of the above procedure should only be interpreted and judged by a Board-Certified Interventional Pain Specialist with extensive familiarity and expertise in the same exact procedure and technique. Attempts at providing opinions without similar or greater experience and expertise than that of the  treating physician will be considered as inappropriate and unethical, and shall result in a formal complaint to the state medical board and applicable specialty societies.

## 2015-05-19 NOTE — Patient Instructions (Signed)
Sacroiliac (SI) Joint Injection Patient Information  Description: The sacroiliac joint connects the scrum (very low back and tailbone) to the ilium (a pelvic bone which also forms half of the hip joint).  Normally this joint experiences very little motion.  When this joint becomes inflamed or unstable low back and or hip and pelvis pain may result.  Injection of this joint with local anesthetics (numbing medicines) and steroids can provide diagnostic information and reduce pain.  This injection is performed with the aid of x-ray guidance into the tailbone area while you are lying on your stomach.   You may experience an electrical sensation down the leg while this is being done.  You may also experience numbness.  We also may ask if we are reproducing your normal pain during the injection.  Conditions which may be treated SI injection:   Low back, buttock, hip or leg pain  Preparation for the Injection:  1. Do not eat any solid food or dairy products within 6 hours of your appointment.  2. You may drink clear liquids up to 2 hours before appointment.  Clear liquids include water, black coffee, juice or soda.  No milk or cream please. 3. You may take your regular medications, including pain medications with a sip of water before your appointment.  Diabetics should hold regular insulin (if take separately) and take 1/2 normal NPH dose the morning of the procedure.  Carry some sugar containing items with you to your appointment. 4. A driver must accompany you and be prepared to drive you home after your procedure. 5. Bring all of your current medications with you. 6. An IV may be inserted and sedation may be given at the discretion of the physician. 7. A blood pressure cuff, EKG and other monitors will often be applied during the procedure.  Some patients may need to have extra oxygen administered for a short period.  8. You will be asked to provide medical information, including your allergies,  prior to the procedure.  We must know immediately if you are taking blood thinners (like Coumadin/Warfarin) or if you are allergic to IV iodine contrast (dye).  We must know if you could possible be pregnant.  Possible side effects:   Bleeding from needle site  Infection (rare, may require surgery)  Nerve injury (rare)  Numbness & tingling (temporary)  A brief convulsion or seizure  Light-headedness (temporary)  Pain at injection site (several days)  Decreased blood pressure (temporary)  Weakness in the leg (temporary)   Call if you experience:   New onset weakness or numbness of an extremity below the injection site that last more than 8 hours.  Hives or difficulty breathing ( go to the emergency room)  Inflammation or drainage at the injection site  Any new symptoms which are concerning to you  Please note:  Although the local anesthetic injected can often make your back/ hip/ buttock/ leg feel good for several hours after the injections, the pain will likely return.  It takes 3-7 days for steroids to work in the sacroiliac area.  You may not notice any pain relief for at least that one week.  If effective, we will often do a series of three injections spaced 3-6 weeks apart to maximally decrease your pain.  After the initial series, we generally will wait some months before a repeat injection of the same type.  If you have any questions, please call [phone] [facility]  Pain Management Discharge   Instructions  General Discharge Instructions :  If you need to reach your doctor call: Monday-Friday 8:00 am - 4:00 pm at [phone] or toll free [phone].  After clinic hours [phone] to have operator reach doctor.  Bring all of your medication bottles to all your appointments in the pain clinic.  To cancel or reschedule your appointment with Pain Management please remember to call 24 hours in advance to avoid a  fee.  Refer to the educational materials which you have been given on: General Risks, I had my Procedure. Discharge Instructions, Post Sedation.  Post Procedure Instructions:  The drugs you were given will stay in your system until tomorrow, so for the next 24 hours you should not drive, make any legal decisions or drink any alcoholic beverages.  You may eat anything you prefer, but it is better to start with liquids then soups and crackers, and gradually work up to solid foods.  Please notify your doctor immediately if you have any unusual bleeding, trouble breathing or pain that is not related to your normal pain.  Depending on the type of procedure that was done, some parts of your body may feel week and/or numb.  This usually clears up by tonight or the next day.  Walk with the use of an assistive device or accompanied by an adult for the 24 hours.  You may use ice on the affected area for the first 24 hours.  Put ice in a Ziploc bag and cover with a towel and place against area 15 minutes on 15 minutes off.  You may switch to heat after 24 hours. 

## 2015-05-20 ENCOUNTER — Telehealth: Payer: Self-pay | Admitting: *Deleted

## 2015-05-20 NOTE — Progress Notes (Signed)
Quick Note:  This imaging study has been reviewed and not found to have any major pathology requiring urgent or emergency care. Imaging reveals: 1. Uterine fundal findings which are likely related to prior endometrial ablation. 2. No evidence of endometriosis. 3. Possible constipation. 4. No other explanation for pain.  ______

## 2015-05-20 NOTE — Telephone Encounter (Signed)
Left voicemail to call our office if there are any questions or concerns re; procedure on yesterday.

## 2015-05-22 ENCOUNTER — Encounter: Payer: Self-pay | Admitting: Pain Medicine

## 2015-05-23 ENCOUNTER — Telehealth: Payer: Self-pay

## 2015-05-23 NOTE — Telephone Encounter (Signed)
Spoke to patient on phone, she is describing bloody stools and increased pain.  States that she already spoke with someone else in our office that spoke with Dr Dossie Arbour and he suggested the patient see her PCP or go to the ED.  Patient states that she is getting ready to go to the ED "to get some answers".

## 2015-05-23 NOTE — Telephone Encounter (Signed)
Patient had procedure Thursday and has some questions and concerns. Please call asap.

## 2015-05-26 ENCOUNTER — Ambulatory Visit (INDEPENDENT_AMBULATORY_CARE_PROVIDER_SITE_OTHER): Payer: BLUE CROSS/BLUE SHIELD | Admitting: Family Medicine

## 2015-05-26 ENCOUNTER — Encounter: Payer: Self-pay | Admitting: Family Medicine

## 2015-05-26 VITALS — BP 117/81 | HR 94 | Temp 98.3°F | Ht 61.2 in | Wt 115.0 lb

## 2015-05-26 DIAGNOSIS — R102 Pelvic and perineal pain: Principal | ICD-10-CM

## 2015-05-26 DIAGNOSIS — G8929 Other chronic pain: Secondary | ICD-10-CM

## 2015-05-26 DIAGNOSIS — D6851 Activated protein C resistance: Secondary | ICD-10-CM

## 2015-05-26 DIAGNOSIS — N949 Unspecified condition associated with female genital organs and menstrual cycle: Secondary | ICD-10-CM | POA: Diagnosis not present

## 2015-05-26 NOTE — Progress Notes (Signed)
BP 117/81 mmHg  Pulse 94  Temp(Src) 98.3 F (36.8 C)  Ht 5' 1.2" (1.554 m)  Wt 115 lb (52.164 kg)  BMI 21.60 kg/m2  SpO2 99%  LMP 04/19/2015 (Exact Date)   Subjective:    Patient ID: Karen Marsh, female    DOB: [birth year], 35 y.o.   MRN: [id number]  HPI: Karen Marsh is a 35 y.o. female  Chief Complaint  Patient presents with  . Pelvic Pain    started in October, patient fed up with no answers  . Rectal Bleeding    was seen at [facility] ED   patient with long history of pelvic pain and pelvic discomfort. Patient's had extensive surgery 2 times MRI, ultrasound sounds like 3 times and multiple exams. Most recent was in the emergency room at [facility] which was precipitated by some bright red painless rectal bleeding. 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 Patient sees psychiatry and depression anxiety is only partially controlled. This is because patient's chronic pain and pelvic discomfort persists. Patient's been to the pain clinic has had an injection in her SI joint with no relief has not started gabapentin as prescribed. Patient has complicated GYN history  Relevant past medical, surgical, family and social history reviewed and updated as indicated. Interim medical history since our last visit reviewed. Allergies and medications reviewed and updated.  Review of Systems  Constitutional: Negative for fatigue.  Respiratory: Negative.   Cardiovascular: Negative.   Gastrointestinal: Positive for constipation, blood in stool and anal bleeding. Negative for vomiting, abdominal pain and rectal pain.  Genitourinary: Negative for dysuria, frequency, hematuria and enuresis.    Per HPI unless specifically indicated above     Objective:    BP 117/81 mmHg  Pulse 94  Temp(Src) 98.3 F (36.8 C)  Ht 5' 1.2" (1.554 m)  Wt 115 lb (52.164 kg)  BMI 21.60 kg/m2  SpO2 99%  LMP 04/19/2015 (Exact Date)  Wt Readings from Last 3 Encounters:  05/26/15 115 lb  (52.164 kg)  05/19/15 114 lb 8 oz (51.937 kg)  05/10/15 117 lb (53.071 kg)    Physical Exam  Results for orders placed or performed in visit on 05/10/15  T4 AND TSH  Result Value Ref Range   TSH 2.700 0.450 - 4.500 uIU/mL   T4, Total 8.8 4.5 - 12.0 ug/dL      Assessment & Plan:   Problem List Items Addressed This Visit      Hematopoietic and Hemostatic   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 Kathrin Ruddy at [facility]        Other   Chronic pelvic pain in female (Location of Primary Source of Pain) (belt-like around her entire pelvis) - Primary (Chronic)    Reviewed chronic pain and multiple negative workups which is reassuring.  I spoke frankly to the patient that if this had been a serious condition she would be dead by now. It is reassuring that all the testing she has had which is designed to detect very bad stuff has come back negative. It is okay at this point to focus on the inflamed nerves in her pelvis which can be specifically treated by gabapentin which her pain specialist has prescribed and she has not taken yet. I reviewed dosing with her. I reviewed the importance of healthy immune system which  includes eating healthy, modest vices, exercise, and being active in her faith.            Follow up plan: Return for As scheduled.

## 2015-05-26 NOTE — Assessment & Plan Note (Signed)
I reviewed that if in the future hormone replacement therapy is considered the possibility of a referral with Dr. West Pugh at [facility]

## 2015-05-26 NOTE — Assessment & Plan Note (Signed)
Reviewed chronic pain and multiple negative workups which is reassuring.  I spoke frankly to the patient that if this had been a serious condition she would be dead by now. It is reassuring that all the testing she has had which is designed to detect very bad stuff has come back negative. It is okay at this point to focus on the inflamed nerves in her pelvis which can be specifically treated by gabapentin which her pain specialist has prescribed and she has not taken yet. I reviewed dosing with her. I reviewed the importance of healthy immune system which includes eating healthy, modest vices, exercise, and being active in her faith.

## 2015-05-27 ENCOUNTER — Ambulatory Visit (INDEPENDENT_AMBULATORY_CARE_PROVIDER_SITE_OTHER): Payer: BLUE CROSS/BLUE SHIELD | Admitting: Family Medicine

## 2015-05-27 ENCOUNTER — Encounter: Payer: Self-pay | Admitting: Family Medicine

## 2015-05-27 ENCOUNTER — Other Ambulatory Visit: Payer: Self-pay | Admitting: Family Medicine

## 2015-05-27 VITALS — BP 126/90 | HR 118 | Temp 98.4°F | Ht 61.2 in | Wt 115.0 lb

## 2015-05-27 DIAGNOSIS — R300 Dysuria: Secondary | ICD-10-CM

## 2015-05-27 DIAGNOSIS — N3001 Acute cystitis with hematuria: Secondary | ICD-10-CM

## 2015-05-27 DIAGNOSIS — R319 Hematuria, unspecified: Secondary | ICD-10-CM

## 2015-05-27 DIAGNOSIS — N898 Other specified noninflammatory disorders of vagina: Secondary | ICD-10-CM | POA: Diagnosis not present

## 2015-05-27 DIAGNOSIS — R350 Frequency of micturition: Secondary | ICD-10-CM | POA: Diagnosis not present

## 2015-05-27 LAB — UA/M W/RFLX CULTURE, ROUTINE
BILIRUBIN UA: NEGATIVE
Glucose, UA: NEGATIVE
Ketones, UA: NEGATIVE
LEUKOCYTES UA: NEGATIVE
Nitrite, UA: NEGATIVE
PH UA: 6 (ref 5.0–7.5)
PROTEIN UA: NEGATIVE
Specific Gravity, UA: <1.005 — ABNORMAL LOW (ref 1.005–1.030)
Urobilinogen, Ur: 0.2 mg/dL (ref 0.2–1.0)

## 2015-05-27 LAB — WET PREP FOR TRICH, YEAST, CLUE
Clue Cell Exam: NEGATIVE
Trichomonas Exam: NEGATIVE
Yeast Exam: NEGATIVE

## 2015-05-27 LAB — MICROSCOPIC EXAMINATION: WBC UA: NONE SEEN /HPF (ref 0–?)

## 2015-05-27 MED ORDER — CIPROFLOXACIN HCL 250 MG PO TABS: 250.0000 mg | ORAL_TABLET | Freq: Two times a day (BID) | ORAL | Status: DC

## 2015-05-27 NOTE — Progress Notes (Signed)
BP 126/90 mmHg  Pulse 118  Temp(Src) 98.4 F (36.9 C)  Ht 5' 1.2" (1.554 m)  Wt 115 lb (52.164 kg)  BMI 21.60 kg/m2  SpO2 100%  LMP 04/19/2015 (Exact Date)   Subjective:    Patient ID: Karen Marsh, female    DOB: 1980-12-13, 35 y.o.   MRN: [id number]  HPI: Karen Marsh is a 35 y.o. female  Chief Complaint  Patient presents with  . Abdominal Pain    patient complains of abdominal pain, frequent urination and not emptying bladder. She went to ER was told she had bacteria and blood in her urine.   URINARY SYMPTOMS Duration: Since October 7th Dysuria: yes Urinary frequency: yes Urgency: yes Small volume voids: yes Symptom severity: moderate Urinary incontinence: no Foul odor: no Hematuria: no Abdominal pain: yes Back pain: no Suprapubic pain/pressure: no Flank pain: no Fever:  no Vomiting: no Status: stable Previous urinary tract infection: yes History of sexually transmitted disease: no Vaginal discharge: yes Treatments attempted: none   Relevant past medical, surgical, family and social history reviewed and updated as indicated. Interim medical history since our last visit reviewed. Allergies and medications reviewed and updated.  Review of Systems  Constitutional: Negative.   Respiratory: Negative.   Cardiovascular: Negative.   Gastrointestinal: Positive for abdominal pain. Negative for nausea, vomiting, diarrhea, constipation, blood in stool, abdominal distention, anal bleeding and rectal pain.  Genitourinary: Negative.   Psychiatric/Behavioral: Negative.     Per HPI unless specifically indicated above     Objective:    BP 126/90 mmHg  Pulse 118  Temp(Src) 98.4 F (36.9 C)  Ht 5' 1.2" (1.554 m)  Wt 115 lb (52.164 kg)  BMI 21.60 kg/m2  SpO2 100%  LMP 04/19/2015 (Exact Date)  Wt Readings from Last 3 Encounters:  05/27/15 115 lb (52.164 kg)  05/26/15 115 lb (52.164 kg)  05/19/15 114 lb 8 oz (51.937 kg)    Physical Exam  Constitutional: She  is oriented to person, place, and time. She appears well-developed and well-nourished. No distress.  HENT:  Head: Normocephalic and atraumatic.  Right Ear: Hearing normal.  Left Ear: Hearing normal.  Nose: Nose normal.  Eyes: Conjunctivae and lids are normal. Right eye exhibits no discharge. Left eye exhibits no discharge. No scleral icterus.  Cardiovascular: Normal rate, regular rhythm, normal heart sounds and intact distal pulses.  Exam reveals no gallop and no friction rub.   No murmur heard. Pulmonary/Chest: Effort normal and breath sounds normal. No respiratory distress. She has no wheezes. She has no rales. She exhibits no tenderness.  Abdominal: Soft. Bowel sounds are normal. She exhibits no distension and no mass. There is no tenderness. There is no rebound and no guarding.  Musculoskeletal: Normal range of motion.  Neurological: She is alert and oriented to person, place, and time.  Skin: Skin is warm and intact. No rash noted. No erythema. No pallor.  Psychiatric: She has a normal mood and affect. Her speech is normal and behavior is normal. Judgment and thought content normal. Cognition and memory are normal.  Nursing note and vitals reviewed.   Results for orders placed or performed in visit on 05/10/15  T4 AND TSH  Result Value Ref Range   TSH 2.700 0.450 - 4.500 uIU/mL   T4, Total 8.8 4.5 - 12.0 ug/dL      Assessment & Plan:   Problem List Items Addressed This Visit    None    Visit Diagnoses    Acute cystitis  with hematuria    -  Primary    +UTI at [facility] 3 days ago, +blood today- will treat for UTI with cipro    Frequent urination        +UTI at [facility] 3 days ago, +blood today- will treat for UTI with cipro    Hematuria        Dysuria        +UTI at [facility] 3 days ago, +blood today- will treat for UTI with cipro    Vaginal discharge        Wet prep checked today. Negative.     Relevant Orders    WET PREP FOR TRICH, YEAST, CLUE        Follow up plan: Return if  symptoms worsen or fail to improve.

## 2015-06-07 ENCOUNTER — Ambulatory Visit: Payer: Self-pay | Admitting: General Surgery

## 2015-06-15 ENCOUNTER — Ambulatory Visit: Payer: BLUE CROSS/BLUE SHIELD | Admitting: Pain Medicine

## 2015-07-26 HISTORY — PX: ABDOMINAL HYSTERECTOMY: SHX81

## 2015-07-26 HISTORY — PX: TOTAL ABDOMINAL HYSTERECTOMY: SHX209

## 2015-11-04 ENCOUNTER — Encounter: Payer: Self-pay | Admitting: *Deleted

## 2015-11-07 ENCOUNTER — Ambulatory Visit: Payer: Self-pay | Admitting: General Surgery

## 2015-11-21 ENCOUNTER — Ambulatory Visit (INDEPENDENT_AMBULATORY_CARE_PROVIDER_SITE_OTHER): Payer: BLUE CROSS/BLUE SHIELD | Admitting: Family Medicine

## 2015-11-21 ENCOUNTER — Encounter: Payer: Self-pay | Admitting: Family Medicine

## 2015-11-21 VITALS — BP 107/73 | HR 81 | Temp 98.0°F | Ht 62.0 in | Wt 122.0 lb

## 2015-11-21 DIAGNOSIS — Z23 Encounter for immunization: Secondary | ICD-10-CM | POA: Diagnosis not present

## 2015-11-21 DIAGNOSIS — Z90710 Acquired absence of both cervix and uterus: Secondary | ICD-10-CM

## 2015-11-21 DIAGNOSIS — R300 Dysuria: Secondary | ICD-10-CM | POA: Diagnosis not present

## 2015-11-21 DIAGNOSIS — J302 Other seasonal allergic rhinitis: Secondary | ICD-10-CM | POA: Diagnosis not present

## 2015-11-21 DIAGNOSIS — R1010 Upper abdominal pain, unspecified: Secondary | ICD-10-CM

## 2015-11-21 DIAGNOSIS — F419 Anxiety disorder, unspecified: Secondary | ICD-10-CM

## 2015-11-21 LAB — URINALYSIS, ROUTINE W REFLEX MICROSCOPIC
BILIRUBIN UA: NEGATIVE
GLUCOSE, UA: NEGATIVE
Ketones, UA: NEGATIVE
Leukocytes, UA: NEGATIVE
NITRITE UA: NEGATIVE
Protein, UA: NEGATIVE
RBC, UA: NEGATIVE
Specific Gravity, UA: 1.015 (ref 1.005–1.030)
UUROB: 0.2 mg/dL (ref 0.2–1.0)
pH, UA: 5.5 (ref 5.0–7.5)

## 2015-11-21 MED ORDER — ALBUTEROL SULFATE HFA 108 (90 BASE) MCG/ACT IN AERS: 2.0000 | INHALATION_SPRAY | RESPIRATORY_TRACT | 2 refills | Status: DC | PRN

## 2015-11-21 MED ORDER — LORAZEPAM 0.5 MG PO TABS: 0.5000 mg | ORAL_TABLET | ORAL | 0 refills | Status: DC | PRN

## 2015-11-21 NOTE — Assessment & Plan Note (Signed)
Discussed care and treatment okay to use albuterol

## 2015-11-21 NOTE — Patient Instructions (Addendum)
Tdap Vaccine (Tetanus, Diphtheria and Pertussis): What You Need to Know 1. Why get vaccinated? Tetanus, diphtheria and pertussis are very serious diseases. Tdap vaccine can protect us from these diseases. And, Tdap vaccine given to pregnant women can protect newborn babies against pertussis. TETANUS (Lockjaw) is rare in the [place] today. It causes painful muscle tightening and stiffness, usually all over the body.  It can lead to tightening of muscles in the head and neck so you can't open your mouth, swallow, or sometimes even breathe. Tetanus kills about 1 out of 10 people who are infected even after receiving the best medical care. DIPHTHERIA is also rare in the [place] today. It can cause a thick coating to form in the back of the throat.  It can lead to breathing problems, heart failure, paralysis, and death. PERTUSSIS (Whooping Cough) causes severe coughing spells, which can cause difficulty breathing, vomiting and disturbed sleep.  It can also lead to weight loss, incontinence, and rib fractures. Up to 2 in 100 adolescents and 5 in 100 adults with pertussis are hospitalized or have complications, which could include pneumonia or death. These diseases are caused by bacteria. Diphtheria and pertussis are spread from person to person through secretions from coughing or sneezing. Tetanus enters the body through cuts, scratches, or wounds. Before vaccines, as many as 200,000 cases of diphtheria, 200,000 cases of pertussis, and hundreds of cases of tetanus, were reported in the [place] each year. Since vaccination began, reports of cases for tetanus and diphtheria have dropped by about 99% and for pertussis by about 80%. 2. Tdap vaccine Tdap vaccine can protect adolescents and adults from tetanus, diphtheria, and pertussis. One dose of Tdap is routinely given at age 11 or 12. People who did not get Tdap at that age should get it as soon as possible. Tdap is especially important  for healthcare professionals and anyone having close contact with a baby younger than 12 months. Pregnant women should get a dose of Tdap during every pregnancy, to protect the newborn from pertussis. Infants are most at risk for severe, life-threatening complications from pertussis. Another vaccine, called Td, protects against tetanus and diphtheria, but not pertussis. A Td booster should be given every 10 years. Tdap may be given as one of these boosters if you have never gotten Tdap before. Tdap may also be given after a severe cut or burn to prevent tetanus infection. Your doctor or the person giving you the vaccine can give you more information. Tdap may safely be given at the same time as other vaccines. 3. Some people should not get this vaccine  A person who has ever had a life-threatening allergic reaction after a previous dose of any diphtheria, tetanus or pertussis containing vaccine, OR has a severe allergy to any part of this vaccine, should not get Tdap vaccine. Tell the person giving the vaccine about any severe allergies.  Anyone who had coma or long repeated seizures within 7 days after a childhood dose of DTP or DTaP, or a previous dose of Tdap, should not get Tdap, unless a cause other than the vaccine was found. They can still get Td.  Talk to your doctor if you:  have seizures or another nervous system problem,  had severe pain or swelling after any vaccine containing diphtheria, tetanus or pertussis,  ever had a condition called Guillain-Barr Syndrome (GBS),  aren't feeling well on the day the shot is scheduled. 4. Risks With any medicine, including vaccines, there is   a chance of side effects. These are usually mild and go away on their own. Serious reactions are also possible but are rare. Most people who get Tdap vaccine do not have any problems with it. Mild problems following Tdap (Did not interfere with activities)  Pain where the shot was given (about 3 in 4  adolescents or 2 in 3 adults)  Redness or swelling where the shot was given (about 1 person in 5)  Mild fever of at least 100.4F (up to about 1 in 25 adolescents or 1 in 100 adults)  Headache (about 3 or 4 people in 10)  Tiredness (about 1 person in 3 or 4)  Nausea, vomiting, diarrhea, stomach ache (up to 1 in 4 adolescents or 1 in 10 adults)  Chills, sore joints (about 1 person in 10)  Body aches (about 1 person in 3 or 4)  Rash, swollen glands (uncommon) Moderate problems following Tdap (Interfered with activities, but did not require medical attention)  Pain where the shot was given (up to 1 in 5 or 6)  Redness or swelling where the shot was given (up to about 1 in 16 adolescents or 1 in 12 adults)  Fever over 102F (about 1 in 100 adolescents or 1 in 250 adults)  Headache (about 1 in 7 adolescents or 1 in 10 adults)  Nausea, vomiting, diarrhea, stomach ache (up to 1 or 3 people in 100)  Swelling of the entire arm where the shot was given (up to about 1 in 500). Severe problems following Tdap (Unable to perform usual activities; required medical attention)  Swelling, severe pain, bleeding and redness in the arm where the shot was given (rare). Problems that could happen after any vaccine:  People sometimes faint after a medical procedure, including vaccination. Sitting or lying down for about 15 minutes can help prevent fainting, and injuries caused by a fall. Tell your doctor if you feel dizzy, or have vision changes or ringing in the ears.  Some people get severe pain in the shoulder and have difficulty moving the arm where a shot was given. This happens very rarely.  Any medication can cause a severe allergic reaction. Such reactions from a vaccine are very rare, estimated at fewer than 1 in a million doses, and would happen within a few minutes to a few hours after the vaccination. As with any medicine, there is a very remote chance of a vaccine causing a serious  injury or death. The safety of vaccines is always being monitored. For more information, visit: www.cdc.gov/vaccinesafety/ 5. What if there is a serious problem? What should I look for?  Look for anything that concerns you, such as signs of a severe allergic reaction, very high fever, or unusual behavior.  Signs of a severe allergic reaction can include hives, swelling of the face and throat, difficulty breathing, a fast heartbeat, dizziness, and weakness. These would usually start a few minutes to a few hours after the vaccination. What should I do?  If you think it is a severe allergic reaction or other emergency that can't wait, call 9-1-1 or get the person to the nearest hospital. Otherwise, call your doctor.  Afterward, the reaction should be reported to the Vaccine Adverse Event Reporting System (VAERS). Your doctor might file this report, or you can do it yourself through the VAERS web site at www.vaers.hhs.gov, or by calling 1-800-822-7967. VAERS does not give medical advice.  6. The National Vaccine Injury Compensation Program The National Vaccine Injury Compensation Program (  VICP) is a federal program that was created to compensate people who may have been injured by certain vaccines. Persons who believe they may have been injured by a vaccine can learn about the program and about filing a claim by calling 1-800-338-2382 or visiting the VICP website at www.hrsa.gov/vaccinecompensation. There is a time limit to file a claim for compensation. 7. How can I learn more?  Ask your doctor. He or she can give you the vaccine package insert or suggest other sources of information.  Call your local or state health department.  Contact the Centers for Disease Control and Prevention (CDC):  Call 1-800-232-4636 (1-800-CDC-INFO) or  Visit CDC's website at www.cdc.gov/vaccines CDC Tdap Vaccine VIS (06/02/13)   This information is not intended to replace advice given to you by your health care  provider. Make sure you discuss any questions you have with your health care provider.   Document Released: 09/25/2011 Document Revised: 04/16/2014 Document Reviewed: 07/08/2013 Elsevier Interactive Patient Education 2016 Elsevier Inc.  

## 2015-11-21 NOTE — Progress Notes (Addendum)
BP 107/73 (BP Location: Left Arm, Patient Position: Sitting, Cuff Size: Small)   Pulse 81   Temp 98 F (36.7 C)   Ht 5\' 2"  (1.575 m)   Wt 122 lb (55.3 kg)   LMP 04/19/2015 (Exact Date)   SpO2 96%   BMI 22.31 kg/m    Subjective:    Patient ID: Karen Marsh, female    DOB: 07-28-1980, 35 y.o.   MRN: [id number]  HPI: Karen Marsh is a 35 y.o. female  Chief Complaint  Patient presents with  . wants to discuss colonoscopy  . wants to check urine  Patient concerned about urinary tract has third ureter was discovered during hysterectomy surgery agents had no significant urinary tract history in her lifetime. Has had occasional dysuria such as today wants to have urinalysis done which we will do.  Patient's some nonspecific reflux type symptoms hasn't tried Prilosec has GI complaints nonspecific with intermittent loose stools no real constipation concerns of blood in stool or urine.  After hysterectomy is finished with pain clinic doesn't need pain medicines anymore  Relevant past medical, surgical, family and social history reviewed and updated as indicated. Interim medical history since our last visit reviewed. Allergies and medications reviewed and updated.  Review of Systems  Constitutional: Negative.   Respiratory: Negative.        Has been using her inhaler more as allergy season has kicked up here in August  Cardiovascular: Negative.     Per HPI unless specifically indicated above     Objective:    BP 107/73 (BP Location: Left Arm, Patient Position: Sitting, Cuff Size: Small)   Pulse 81   Temp 98 F (36.7 C)   Ht 5\' 2"  (1.575 m)   Wt 122 lb (55.3 kg)   LMP 04/19/2015 (Exact Date)   SpO2 96%   BMI 22.31 kg/m   Wt Readings from Last 3 Encounters:  11/21/15 122 lb (55.3 kg)  05/27/15 115 lb (52.2 kg)  05/26/15 115 lb (52.2 kg)    Physical Exam  Constitutional: She is oriented to person, place, and time. She appears well-developed and well-nourished. No  distress.  HENT:  Head: Normocephalic and atraumatic.  Right Ear: Hearing normal.  Left Ear: Hearing normal.  Nose: Nose normal.  Eyes: Conjunctivae and lids are normal. Right eye exhibits no discharge. Left eye exhibits no discharge. No scleral icterus.  Cardiovascular: Normal rate, regular rhythm and normal heart sounds.   Pulmonary/Chest: Effort normal and breath sounds normal. No respiratory distress.  Musculoskeletal: Normal range of motion.  Neurological: She is alert and oriented to person, place, and time.  Skin: Skin is intact. No rash noted.  Psychiatric: She has a normal mood and affect. Her speech is normal and behavior is normal. Judgment and thought content normal. Cognition and memory are normal.    Results for orders placed or performed in visit on 05/27/15  WET PREP FOR TRICH, YEAST, CLUE  Result Value Ref Range   Trichomonas Exam Negative Negative   Yeast Exam Negative Negative   Clue Cell Exam Negative Negative  Microscopic Examination  Result Value Ref Range   WBC, UA None seen 0 - 5 /hpf   RBC, UA 0-2 0 - 2 /hpf   Epithelial Cells (non renal) 0-10 0 - 10 /hpf   Bacteria, UA Few None seen/Few  UA/M w/rflx Culture, Routine  Result Value Ref Range   Specific Gravity, UA <1.005 (L) 1.005 - 1.030   pH, UA 6.0  5.0 - 7.5   Color, UA Yellow Yellow   Appearance Ur Clear Clear   Leukocytes, UA Negative Negative   Protein, UA Negative Negative/Trace   Glucose, UA Negative Negative   Ketones, UA Negative Negative   RBC, UA Trace (A) Negative   Bilirubin, UA Negative Negative   Urobilinogen, Ur 0.2 0.2 - 1.0 mg/dL   Nitrite, UA Negative Negative   Microscopic Examination See below:       Assessment & Plan:   Problem List Items Addressed This Visit      Respiratory   Seasonal allergies    Discussed care and treatment okay to use albuterol        Other   H/O abdominal hysterectomy   Chronic anxiety    Discussed chronic anxiety limiting use of lorazepam  patient understands      Relevant Medications   LORazepam (ATIVAN) 0.5 MG tablet   Pain, abdominal, nonspecific    Other Visit Diagnoses    Need for Tdap vaccination    -  Primary   Relevant Orders   Tdap vaccine greater than or equal to 7yo IM (Completed)   Dysuria       Discussed dysuria will observe UA was normal leave third ureter alone   Relevant Orders   Urinalysis, Routine w reflex microscopic (not at [facility])       Follow up plan: Return in about 6 months (around 05/23/2016).

## 2015-11-21 NOTE — Assessment & Plan Note (Signed)
Discussed chronic anxiety limiting use of lorazepam patient understands

## 2015-11-22 ENCOUNTER — Telehealth: Payer: Self-pay | Admitting: Family Medicine

## 2015-11-22 DIAGNOSIS — Q625 Duplication of ureter: Secondary | ICD-10-CM

## 2015-11-22 NOTE — Telephone Encounter (Signed)
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

## 2015-11-22 NOTE — Telephone Encounter (Signed)
Pt was in yesterday but she had another question about her lower back pain that she would like to speak with Dr Jeananne Rama about.

## 2015-12-06 ENCOUNTER — Encounter: Payer: Self-pay | Admitting: *Deleted

## 2015-12-19 ENCOUNTER — Encounter: Payer: Self-pay | Admitting: Urology

## 2015-12-19 ENCOUNTER — Ambulatory Visit (INDEPENDENT_AMBULATORY_CARE_PROVIDER_SITE_OTHER): Payer: BLUE CROSS/BLUE SHIELD | Admitting: Urology

## 2015-12-19 VITALS — BP 108/3 | HR 87 | Ht 62.0 in | Wt 124.1 lb

## 2015-12-19 DIAGNOSIS — R350 Frequency of micturition: Secondary | ICD-10-CM | POA: Diagnosis not present

## 2015-12-19 DIAGNOSIS — R3129 Other microscopic hematuria: Secondary | ICD-10-CM

## 2015-12-19 DIAGNOSIS — Q625 Duplication of ureter: Secondary | ICD-10-CM | POA: Diagnosis not present

## 2015-12-19 NOTE — Progress Notes (Signed)
12/19/2015 11:42 AM   Karen Marsh 1980-08-04 [id number]  Referring provider: Steele Sizer, MD [address] [place], [place] [address]  No chief complaint on file.   HPI: Referred by Dr. Dossie Arbour for duplicated left ureter. Pt is a 35 yo s/p TLHBS for endometriosis Apr 2017 by Dr. Marney Doctor. Dr. 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 On cystoscopy 3 ureteral orifices were noted with clear efflux. I reviewed the op note. Her kidney function has been normal. She has a h/o chronic pelvic and back pain and sees pain management. A lot of her pain started Oct 2016. The patient was concerned the kidneys or bladder may be involved. She has some baseline frequency. No urgency. Occasional nocturia and dysuria. She has "UTI's" in the past. No gross hematuria or flank pain. No history of kidney stones or stone passage. A Feb 2017 UA showed 4 rbc's per hpf. I did not see a urine Cx. She was told one time at her Gyn that her bladder wasn't empty after she voided.   PMH: Past Medical History:  Diagnosis Date  . Anxiety   . Blood dyscrasia    FACTOR V LEIDEN  . Complication of anesthesia   . Dermoid cyst of ovary    L  . Endometriosis   . Factor V Leiden mutation (HCC)   . Pneumonia   . PONV (postoperative nausea and vomiting)    AFTER ABLATION  . Pre-syncope 02-2015  . Quadrantanopsia    R inferior   . Seasonal allergies   . Shortness of breath dyspnea   . Stroke (HCC)    In past-UNSURE WHEN IT HAPPENED-PT WAS TOLD IT COULD HAVE HAPPENED IN THE WOMB-PT HAS NO PERIPHERAL VISION BECAUSE OF THIS    Surgical History: Past Surgical History:  Procedure Laterality Date  . ABDOMINAL HYSTERECTOMY  07/26/2015   left ovary remaining  . BREAST LUMPECTOMY Right 2004   R breast/ Dr Evette Cristal  . CESAREAN SECTION  2002 and 2011  . DILATION AND CURETTAGE OF UTERUS  2000  . ENDOMETRIAL ABLATION  2015  . LAPAROSCOPIC OVARIAN CYSTECTOMY Left 03/01/2015   Procedure: LEFT  LAPAROSCOPIC OVARIAN CYSTECTOMY, bilateral tubal cystectomies;  Surgeon: Nadara Mustard, MD;  Location: [facility] ORS;  Service: Gynecology;  Laterality: Left;  . LAPAROSCOPY N/A 03/01/2015   Procedure: LAPAROSCOPY OPERATIVE;  Surgeon: Nadara Mustard, MD;  Location: [facility] ORS;  Service: Gynecology;  Laterality: N/A;  . NASAL SINUS SURGERY  2014  . oophrectomy Right 2010  . TONSILLECTOMY  2009  . tummy tuck  2012    Home Medications:    Medication List       Accurate as of 12/19/15 11:42 AM. Always use your most recent med list.          albuterol 108 (90 Base) MCG/ACT inhaler Commonly known as:  PROAIR HFA Inhale 2 puffs into the lungs as needed.   amphetamine-dextroamphetamine 30 MG 24 hr capsule Commonly known as:  ADDERALL XR 30 mg daily.   aspirin 81 MG EC tablet Take 81 mg by mouth daily.   cholecalciferol 1000 units tablet Commonly known as:  VITAMIN D Take 1,000 Units by mouth daily.   CYMBALTA 60 MG capsule Generic drug:  DULoxetine Take 60 mg by mouth daily.   ibuprofen 600 MG tablet Commonly known as:  ADVIL,MOTRIN Take 1 tablet (600 mg total) by mouth every 8 (eight) hours as needed.   loratadine 10 MG tablet Commonly known as:  CLARITIN Take 10 mg by mouth daily.   LORazepam 0.5 MG tablet Commonly known as:  ATIVAN Take 1 tablet (0.5 mg total) by mouth as needed for anxiety.       Allergies:  Allergies  Allergen Reactions  . Azithromycin Nausea And Vomiting  . Codeine     Family History: Family History  Problem Relation Age of Onset  . Cancer Maternal Grandfather     prostate  . Diabetes Maternal Grandfather   . Parkinson's disease Father   . Diabetes Maternal Grandmother   . Cancer Maternal Grandmother     throat    Social History:  reports that she quit smoking about 13 years ago. Her smoking use included Cigarettes. She has a 0.50 pack-year smoking history. She has never used smokeless tobacco. She reports that she drinks alcohol. She  reports that she does not use drugs.  ROS:                                        Physical Exam: LMP 04/19/2015 (Exact Date)   Constitutional:  Alert and oriented, No acute distress. HEENT: [place] AT, moist mucus membranes.  Trachea midline, no masses. Cardiovascular: No clubbing, cyanosis, or edema. Respiratory: Normal respiratory effort, no increased work of breathing. Skin: No rashes, bruises or suspicious lesions. Lymph: No cervical or inguinal adenopathy. Neurologic: Grossly intact, no focal deficits, moving all 4 extremities. Psychiatric: Normal mood and affect.  Laboratory Data: Lab Results  Component Value Date   WBC 6.3 03/01/2015   HGB 13.9 03/01/2015   HCT 41.5 03/01/2015   MCV 88.8 03/01/2015   PLT 296 03/01/2015    Lab Results  Component Value Date   CREATININE 0.52 04/06/2015    No results found for: PSA  No results found for: TESTOSTERONE  No results found for: HGBA1C  Urinalysis    Component Value Date/Time   COLORURINE YELLOW (A) 01/19/2015 2104   APPEARANCEUR Cloudy (A) 11/21/2015 1124   LABSPEC 1.014 01/19/2015 2104   PHURINE 6.0 01/19/2015 2104   GLUCOSEU Negative 11/21/2015 1124   HGBUR NEGATIVE 01/19/2015 2104   BILIRUBINUR Negative 11/21/2015 1124   KETONESUR TRACE (A) 01/19/2015 2104   PROTEINUR Negative 11/21/2015 1124   PROTEINUR NEGATIVE 01/19/2015 2104   NITRITE Negative 11/21/2015 1124   NITRITE NEGATIVE 01/19/2015 2104   LEUKOCYTESUR Negative 11/21/2015 1124    Assessment & Plan:    1) duplicated left ureter - we discussed duplicated ureters are a variant of normal and typically do not cause any clinically significant issues, however the upper pole can be associated with an obstructed or poorly functioning moiety. I did not see any renal imaging on CareEverywhere or Epic, therefore I'll set her up for imaging. We discussed the nature, r/b of U/S vs CT imaging and pt elected to proceed with CT.   2) MH - again  not likely clinically significant, but given this finding and her symptoms she'll return for exam and cystoscopy. We discussed the nature, r/b of cystoscopy.   3) frequency - check PVR on return.   No Follow-up on file.  Jerilee Field, MD  [facility] [address], [address] [place], [place] [address] [phone]

## 2015-12-20 LAB — BUN: BUN: 8 mg/dL (ref 6–20)

## 2015-12-20 LAB — CREATININE, SERUM
Creatinine, Ser: 0.6 mg/dL (ref 0.57–1.00)
GFR, EST AFRICAN AMERICAN: 138 mL/min/{1.73_m2} (ref >59)
GFR, EST NON AFRICAN AMERICAN: 119 mL/min/{1.73_m2} (ref >59)

## 2015-12-26 ENCOUNTER — Ambulatory Visit
Admission: RE | Admit: 2015-12-26 | Discharge: 2015-12-26 | Disposition: A | Discharge status: Home / Self Care | Payer: BLUE CROSS/BLUE SHIELD | Source: Ambulatory Visit | Attending: Urology | Admitting: Urology

## 2015-12-26 DIAGNOSIS — Z90721 Acquired absence of ovaries, unilateral: Secondary | ICD-10-CM | POA: Insufficient documentation

## 2015-12-26 DIAGNOSIS — N200 Calculus of kidney: Secondary | ICD-10-CM | POA: Insufficient documentation

## 2015-12-26 DIAGNOSIS — R3129 Other microscopic hematuria: Secondary | ICD-10-CM | POA: Diagnosis present

## 2015-12-26 DIAGNOSIS — Q625 Duplication of ureter: Secondary | ICD-10-CM | POA: Insufficient documentation

## 2015-12-26 DIAGNOSIS — Z90710 Acquired absence of both cervix and uterus: Secondary | ICD-10-CM | POA: Diagnosis not present

## 2015-12-26 DIAGNOSIS — N8312 Corpus luteum cyst of left ovary: Secondary | ICD-10-CM | POA: Diagnosis not present

## 2015-12-26 MED ORDER — IOPAMIDOL (ISOVUE-370) INJECTION 76%
150.0000 mL | Freq: Once | INTRAVENOUS | Status: AC | PRN
  Administered 2015-12-26: 150 mL via INTRAVENOUS

## 2015-12-30 ENCOUNTER — Telehealth: Payer: Self-pay | Admitting: Family Medicine

## 2015-12-30 NOTE — Telephone Encounter (Signed)
Patient called today wanting to now the results of her CT scan. Please review and advise.

## 2016-01-06 ENCOUNTER — Telehealth: Payer: Self-pay

## 2016-01-06 NOTE — Telephone Encounter (Signed)
-----   Message from Festus Aloe, MD sent at 01/06/2016  8:21 AM EDT ----- Notify patient her CT scan was normal - she does have a duplex left side. She also has some kidney stones -- very small on RIGHT and up to 3-4 mm on left. The stones are causing any problems right now. F/u as planned.

## 2016-01-06 NOTE — Telephone Encounter (Signed)
LMOM

## 2016-01-09 ENCOUNTER — Ambulatory Visit (INDEPENDENT_AMBULATORY_CARE_PROVIDER_SITE_OTHER): Payer: BLUE CROSS/BLUE SHIELD | Admitting: Urology

## 2016-01-09 VITALS — BP 117/82 | HR 82 | Ht 61.0 in | Wt 126.7 lb

## 2016-01-09 DIAGNOSIS — M6289 Other specified disorders of muscle: Secondary | ICD-10-CM

## 2016-01-09 DIAGNOSIS — R3129 Other microscopic hematuria: Secondary | ICD-10-CM | POA: Diagnosis not present

## 2016-01-09 DIAGNOSIS — Q625 Duplication of ureter: Secondary | ICD-10-CM

## 2016-01-09 LAB — URINALYSIS, COMPLETE
Bilirubin, UA: NEGATIVE
Glucose, UA: NEGATIVE
Ketones, UA: NEGATIVE
LEUKOCYTES UA: NEGATIVE
Nitrite, UA: NEGATIVE
PH UA: 7 (ref 5.0–7.5)
PROTEIN UA: NEGATIVE
RBC, UA: NEGATIVE
Specific Gravity, UA: 1.015 (ref 1.005–1.030)
Urobilinogen, Ur: 0.2 mg/dL (ref 0.2–1.0)

## 2016-01-09 LAB — MICROSCOPIC EXAMINATION
BACTERIA UA: NONE SEEN
Epithelial Cells (non renal): >10 /hpf — AB (ref 0–10)

## 2016-01-09 MED ORDER — CIPROFLOXACIN HCL 500 MG PO TABS
500.0000 mg | ORAL_TABLET | Freq: Once | ORAL | Status: AC
  Administered 2016-01-09: 500 mg via ORAL

## 2016-01-09 MED ORDER — CIPROFLOXACIN HCL 500 MG PO TABS: 500.0000 mg | ORAL_TABLET | Freq: Once | ORAL | Status: DC

## 2016-01-09 MED ORDER — LIDOCAINE HCL 2 % EX GEL
1.0000 "application " | Freq: Once | CUTANEOUS | Status: AC
  Administered 2016-01-09: 1 via URETHRAL

## 2016-01-09 NOTE — Telephone Encounter (Signed)
Spoke with pt in reference to CT results. Pt voiced understanding.  

## 2016-01-09 NOTE — Progress Notes (Signed)
    Cystoscopy Procedure Note  Patient identification was confirmed, informed consent was obtained, and patient was prepped using Betadine solution.  Lidocaine jelly was administered per urethral meatus.    Preoperative abx where received prior to procedure.    Procedure: - Flexible cystoscope introduced, without any difficulty.   - Thorough search of the bladder revealed:    normal urethral meatus    normal urothelium    no stones    no ulcers     no tumors    no urethral polyps    no trabeculation  - Ureteral orifices were normal in position and appearance.  The patient had 2 ureteral orifices the left trigonal region. The lower orifice puckered some but reflux clear urine.  Post-Procedure: - Patient tolerated the procedure well   Plan: I went over the patient's CT scan with her and we discussed management of nonobstructing stones. The patient will contact us if she would like to proceed with anything more than surveillance. Otherwise, the patient will follow up me as needed. We did discuss stone prevention strategies as well which she is to begin ASAP including increased fluid intake, lemonade therapy, low salt diet, in moderation of calcium/oxalate.

## 2016-05-01 ENCOUNTER — Ambulatory Visit
Admission: RE | Admit: 2016-05-01 | Discharge: 2016-05-01 | Disposition: A | Discharge status: Home / Self Care | Payer: BLUE CROSS/BLUE SHIELD | Source: Ambulatory Visit | Attending: Urology | Admitting: Urology

## 2016-05-01 ENCOUNTER — Other Ambulatory Visit: Payer: Self-pay

## 2016-05-01 DIAGNOSIS — N200 Calculus of kidney: Secondary | ICD-10-CM | POA: Insufficient documentation

## 2016-05-02 ENCOUNTER — Encounter: Payer: Self-pay | Admitting: Urology

## 2016-05-02 ENCOUNTER — Ambulatory Visit: Payer: BLUE CROSS/BLUE SHIELD | Admitting: Urology

## 2016-05-02 VITALS — BP 107/72 | HR 86 | Ht 61.0 in | Wt 133.1 lb

## 2016-05-02 DIAGNOSIS — K5904 Chronic idiopathic constipation: Secondary | ICD-10-CM | POA: Diagnosis not present

## 2016-05-02 DIAGNOSIS — Q625 Duplication of ureter: Secondary | ICD-10-CM | POA: Diagnosis not present

## 2016-05-02 DIAGNOSIS — N200 Calculus of kidney: Secondary | ICD-10-CM | POA: Diagnosis not present

## 2016-05-02 DIAGNOSIS — R3129 Other microscopic hematuria: Secondary | ICD-10-CM

## 2016-05-02 LAB — URINALYSIS, COMPLETE
BILIRUBIN UA: NEGATIVE
GLUCOSE, UA: NEGATIVE
KETONES UA: NEGATIVE
LEUKOCYTES UA: NEGATIVE
Nitrite, UA: NEGATIVE
PROTEIN UA: NEGATIVE
SPEC GRAV UA: 1.02 (ref 1.005–1.030)
UUROB: 0.2 mg/dL (ref 0.2–1.0)
pH, UA: 6 (ref 5.0–7.5)

## 2016-05-02 LAB — MICROSCOPIC EXAMINATION
Epithelial Cells (non renal): >10 /hpf — AB (ref 0–10)
WBC UA: NONE SEEN /HPF (ref 0–?)

## 2016-05-02 NOTE — Progress Notes (Signed)
05/02/2016 10:30 AM   Karen Marsh 06/13/1980 [id number]  Referring provider: Steele Sizer, MD [address] [place], [place] [address]  Chief Complaint  Patient presents with  . Nephrolithiasis    HPI: 36 year old female who presents today concerned that she is having a stone episode.  She has a personal history of kidney stones, bilateral nonobstructing, small. She is passed one spontaneously recently. Over the past week, she is developed lower abdominal pressure and discomfort. She denies any overt flank pain.  She also has low back pain which is chronic. She reports that it feels similar to previous stone episode.  No fevers, chills, dysuria, gross hematuria.  She does a fairly significant history of lifelong chronic constipation. She generally has 2 bowel movements a week. She's not had a bowel movement in several days. She does feel bloated in her abdomen.  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  History of microscopic hematuria status post office cystoscopy on 10/17 unremarkable.  Recently underwent extensive evaluation for lower pelvic pain including pelvic MRI which was unremarkable other than for constipation.  KUB today with small stable left upper pole stone, otherwise no evidence of ureteral or obstructing nephrolithiasis.  PMH: Past Medical History:  Diagnosis Date  . Anxiety   . Blood dyscrasia    FACTOR V LEIDEN  . Complication of anesthesia   . Dermoid cyst of ovary    L  . Endometriosis   . Factor V Leiden mutation (HCC)   . Pneumonia   . PONV (postoperative nausea and vomiting)    AFTER ABLATION  . Pre-syncope 02-2015  . Quadrantanopsia    R inferior   . Seasonal allergies   . Shortness of breath dyspnea   . Stroke (HCC)    In past-UNSURE WHEN IT HAPPENED-PT WAS TOLD IT COULD HAVE HAPPENED IN THE WOMB-PT HAS NO PERIPHERAL VISION BECAUSE OF THIS    Surgical History: Past Surgical History:  Procedure Laterality Date  .  ABDOMINAL HYSTERECTOMY  07/26/2015   left ovary remaining  . BREAST LUMPECTOMY Right 2004   R breast/ Dr Evette Cristal  . CESAREAN SECTION  2002 and 2011  . DILATION AND CURETTAGE OF UTERUS  2000  . ENDOMETRIAL ABLATION  2015  . LAPAROSCOPIC OVARIAN CYSTECTOMY Left 03/01/2015   Procedure: LEFT LAPAROSCOPIC OVARIAN CYSTECTOMY, bilateral tubal cystectomies;  Surgeon: Nadara Mustard, MD;  Location: [facility] ORS;  Service: Gynecology;  Laterality: Left;  . LAPAROSCOPY N/A 03/01/2015   Procedure: LAPAROSCOPY OPERATIVE;  Surgeon: Nadara Mustard, MD;  Location: [facility] ORS;  Service: Gynecology;  Laterality: N/A;  . NASAL SINUS SURGERY  2014  . oophrectomy Right 2010  . TONSILLECTOMY  2009  . tummy tuck  2012    Home Medications:  Allergies as of 05/02/2016      Reactions   Azithromycin Nausea And Vomiting   Codeine       Medication List       Accurate as of 05/02/16 10:30 AM. Always use your most recent med list.          albuterol 108 (90 Base) MCG/ACT inhaler Commonly known as:  PROAIR HFA Inhale 2 puffs into the lungs as needed.   amphetamine-dextroamphetamine 30 MG 24 hr capsule Commonly known as:  ADDERALL XR 30 mg daily.   aspirin 81 MG EC tablet Take 81 mg by mouth daily.   cholecalciferol 1000 units tablet Commonly known as:  VITAMIN D Take 1,000 Units by mouth daily.   CYMBALTA 60  MG capsule Generic drug:  DULoxetine Take 60 mg by mouth daily.   ibuprofen 600 MG tablet Commonly known as:  ADVIL,MOTRIN Take 1 tablet (600 mg total) by mouth every 8 (eight) hours as needed.   loratadine 10 MG tablet Commonly known as:  CLARITIN Take 10 mg by mouth daily.   LORazepam 0.5 MG tablet Commonly known as:  ATIVAN Take 1 tablet (0.5 mg total) by mouth as needed for anxiety.       Allergies:  Allergies  Allergen Reactions  . Azithromycin Nausea And Vomiting  . Codeine     Family History: Family History  Problem Relation Age of Onset  . Cancer Maternal Grandfather      prostate  . Diabetes Maternal Grandfather   . Parkinson's disease Father   . Diabetes Maternal Grandmother   . Cancer Maternal Grandmother     throat    Social History:  reports that she quit smoking about 14 years ago. Her smoking use included Cigarettes. She has a 0.50 pack-year smoking history. She has never used smokeless tobacco. She reports that she drinks alcohol. She reports that she does not use drugs.  ROS: UROLOGY Frequent Urination?: Yes Hard to postpone urination?: No Burning/pain with urination?: No Get up at night to urinate?: No Leakage of urine?: No Urine stream starts and stops?: No Trouble starting stream?: No Do you have to strain to urinate?: No Blood in urine?: No Urinary tract infection?: No Sexually transmitted disease?: No Injury to kidneys or bladder?: No Painful intercourse?: No Weak stream?: No Currently pregnant?: No Vaginal bleeding?: No Last menstrual period?: n  Gastrointestinal Nausea?: Yes Vomiting?: No Indigestion/heartburn?: No Diarrhea?: No Constipation?: No  Constitutional Fever: No Night sweats?: Yes Weight loss?: No Fatigue?: No  Skin Skin rash/lesions?: No Itching?: No  Eyes Blurred vision?: No Double vision?: No  Ears/Nose/Throat Sore throat?: No Sinus problems?: Yes  Hematologic/Lymphatic Swollen glands?: No Easy bruising?: No  Cardiovascular Leg swelling?: No Chest pain?: No  Respiratory Cough?: No Shortness of breath?: No  Endocrine Excessive thirst?: No  Musculoskeletal Back pain?: No Joint pain?: No  Neurological Headaches?: No Dizziness?: No  Psychologic Depression?: Yes Anxiety?: Yes  Physical Exam: BP 107/72 (BP Location: Left Arm, Patient Position: Sitting, Cuff Size: Normal)   Pulse 86   Ht 5\' 1"  (1.549 m)   Wt 133 lb 1.6 oz (60.4 kg)   LMP 04/24/2015   BMI 25.15 kg/m   Constitutional:  Alert and oriented, No acute distress. HEENT: [place] AT, moist mucus membranes.  Trachea  midline, no masses. Cardiovascular: No clubbing, cyanosis, or edema. Respiratory: Normal respiratory effort, no increased work of breathing. GI: Abdomen is soft, nontender, nondistended, no abdominal masses GU: No CVA tenderness.  Skin: No rashes, bruises or suspicious lesions. Neurologic: Grossly intact, no focal deficits, moving all 4 extremities. Psychiatric: Normal mood and affect.  Laboratory Data: Lab Results  Component Value Date   WBC 6.3 03/01/2015   HGB 13.9 03/01/2015   HCT 41.5 03/01/2015   MCV 88.8 03/01/2015   PLT 296 03/01/2015    Lab Results  Component Value Date   CREATININE 0.60 12/19/2015    Urinalysis Results for orders placed or performed in visit on 05/02/16  Microscopic Examination  Result Value Ref Range   WBC, UA None seen 0 - 5 /hpf   RBC, UA 3-10 (A) 0 - 2 /hpf   Epithelial Cells (non renal) >10 (A) 0 - 10 /hpf   Bacteria, UA Few None seen/Few  Urinalysis,  Complete  Result Value Ref Range   Specific Gravity, UA 1.020 1.005 - 1.030   pH, UA 6.0 5.0 - 7.5   Color, UA Yellow Yellow   Appearance Ur Cloudy (A) Clear   Leukocytes, UA Negative Negative   Protein, UA Negative Negative/Trace   Glucose, UA Negative Negative   Ketones, UA Negative Negative   RBC, UA Trace (A) Negative   Bilirubin, UA Negative Negative   Urobilinogen, Ur 0.2 0.2 - 1.0 mg/dL   Nitrite, UA Negative Negative   Microscopic Examination See below:      Pertinent Imaging: CLINICAL DATA:  Back pain for 7 days.  Nephrolithiasis  EXAM: ABDOMEN - 1 VIEW  COMPARISON:  CT 12/26/2015  FINDINGS: Moderate stool burden in the colon. Stool projects over the kidneys which could obscure small stones. Probable small stone projecting over the upper pole of the left kidney as seen on prior CT. No additional suspicious calcifications visualized. Nonobstructive bowel gas pattern. No organomegaly.  IMPRESSION: Probable stable left upper pole nephrolithiasis. No other  definite stones visualized. Moderate stool could obscure small stones.   Electronically Signed   By: Charlett Nose M.D.   On: 05/02/2016 08:34  KUB personally reviewed today with the patient.  Assessment & Plan:    1. Nephrolithiasis No evidence of obstructing ureteral calculus on KUB today. Based on the nature of her pain, do not suspect current symptoms are related to stone pain. Recommend KUB in 1 year to reassess left upper pole stone, small, no intervention recommended at this time. - Urinalysis, Complete - DG Abd 1 View; Future  2. Chronic idiopathic constipation Chronic constipation, symptomatic. I recommended a bowel regimen with MiraLAX/claceas needed and possibly a bowel cleanout with half bottle of mag citrate. Requests referral to GI given the chronicity of this issue. - Ambulatory referral to Gastroenterology  3. Microscopic hematuria Microscopic hematuria status post negative cystoscopy 2017 Urine sample today contaminated with epithelials  4. Duplicated ureter, left Incidental   Return in about 1 year (around 05/02/2017) for KUB.  Vanna Scotland, MD  [facility] [address] [place], [place] [address] [phone]

## 2016-05-03 ENCOUNTER — Encounter: Payer: Self-pay | Admitting: *Deleted

## 2016-05-03 ENCOUNTER — Emergency Department
Admission: EM | Admit: 2016-05-03 | Discharge: 2016-05-04 | Disposition: A | Discharge status: Other Acute Inpatient Hospital | Payer: BLUE CROSS/BLUE SHIELD | Attending: Emergency Medicine | Admitting: Emergency Medicine

## 2016-05-03 DIAGNOSIS — F332 Major depressive disorder, recurrent severe without psychotic features: Secondary | ICD-10-CM

## 2016-05-03 DIAGNOSIS — Z7982 Long term (current) use of aspirin: Secondary | ICD-10-CM | POA: Insufficient documentation

## 2016-05-03 DIAGNOSIS — F4323 Adjustment disorder with mixed anxiety and depressed mood: Secondary | ICD-10-CM | POA: Diagnosis not present

## 2016-05-03 DIAGNOSIS — Z79899 Other long term (current) drug therapy: Secondary | ICD-10-CM | POA: Insufficient documentation

## 2016-05-03 DIAGNOSIS — F329 Major depressive disorder, single episode, unspecified: Secondary | ICD-10-CM | POA: Insufficient documentation

## 2016-05-03 DIAGNOSIS — Z87891 Personal history of nicotine dependence: Secondary | ICD-10-CM | POA: Diagnosis not present

## 2016-05-03 DIAGNOSIS — F32A Depression, unspecified: Secondary | ICD-10-CM

## 2016-05-03 LAB — CBC
HCT: 41 % (ref 35.0–47.0)
HEMOGLOBIN: 13.8 g/dL (ref 12.0–16.0)
MCH: 29.2 pg (ref 26.0–34.0)
MCHC: 33.5 g/dL (ref 32.0–36.0)
MCV: 87.1 fL (ref 80.0–100.0)
Platelets: 353 10*3/uL (ref 150–440)
RBC: 4.71 MIL/uL (ref 3.80–5.20)
RDW: 13.2 % (ref 11.5–14.5)
WBC: 11 10*3/uL (ref 3.6–11.0)

## 2016-05-03 LAB — URINE DRUG SCREEN, QUALITATIVE (ARMC ONLY)
Amphetamines, Ur Screen: POSITIVE — AB
BARBITURATES, UR SCREEN: NOT DETECTED
Benzodiazepine, Ur Scrn: NOT DETECTED
COCAINE METABOLITE, UR ~~LOC~~: NOT DETECTED
Cannabinoid 50 Ng, Ur ~~LOC~~: NOT DETECTED
MDMA (ECSTASY) UR SCREEN: NOT DETECTED
METHADONE SCREEN, URINE: NOT DETECTED
Opiate, Ur Screen: NOT DETECTED
Phencyclidine (PCP) Ur S: NOT DETECTED
TRICYCLIC, UR SCREEN: NOT DETECTED

## 2016-05-03 LAB — COMPREHENSIVE METABOLIC PANEL
ALBUMIN: 4.5 g/dL (ref 3.5–5.0)
ALT: 23 U/L (ref 14–54)
AST: 21 U/L (ref 15–41)
Alkaline Phosphatase: 55 U/L (ref 38–126)
Anion gap: 8 (ref 5–15)
BUN: 10 mg/dL (ref 6–20)
CHLORIDE: 104 mmol/L (ref 101–111)
CO2: 26 mmol/L (ref 22–32)
CREATININE: 0.62 mg/dL (ref 0.44–1.00)
Calcium: 9.1 mg/dL (ref 8.9–10.3)
GFR calc Af Amer: >60 mL/min (ref >60)
GFR calc non Af Amer: >60 mL/min (ref >60)
GLUCOSE: 98 mg/dL (ref 65–99)
Potassium: 3.6 mmol/L (ref 3.5–5.1)
SODIUM: 138 mmol/L (ref 135–145)
Total Bilirubin: 0.4 mg/dL (ref 0.3–1.2)
Total Protein: 7.6 g/dL (ref 6.5–8.1)

## 2016-05-03 LAB — ETHANOL: Alcohol, Ethyl (B): <5 mg/dL (ref <5)

## 2016-05-03 LAB — SALICYLATE LEVEL

## 2016-05-03 LAB — POCT PREGNANCY, URINE: Preg Test, Ur: NEGATIVE

## 2016-05-03 LAB — ACETAMINOPHEN LEVEL: Acetaminophen (Tylenol), Serum: <10 ug/mL — ABNORMAL LOW (ref 10–30)

## 2016-05-03 NOTE — ED Notes (Signed)
Pt escorted to [facility]

## 2016-05-03 NOTE — ED Triage Notes (Addendum)
Pt ambulatory to triag.e pt tearful.  Pt states she is at her breaking point and is having a fucked up year. Pt states SI.  Denies HI.  Pt denies etoh use or drug use.  Pt cooperative and alert.

## 2016-05-03 NOTE — ED Provider Notes (Signed)
[facility] Emergency Department Provider Note  ____________________________________________   I have reviewed the triage vital signs and the nursing notes.   HISTORY  Chief Complaint Depression   History limited by: Not Limited   HPI Karen Marsh is a 36 y.o. female who presents to the emergency department today because of concerns for depression. Patient states that she's been depressed for roughly the past year. [facility] is [facility] an [facility] as an Cymb[facility]. Patient states tonight and event occurred that made things worse. She would not go into the details. She does feel however she is at the end of her rope. She feels like she doesn't want to be her anymore however does not have any plans to hurt herself. She denies any ingestions. She denies any drug use.   Past Medical History:  Diagnosis Date  . Anxiety   . Blood dyscrasia    FACTOR V LEIDEN  . Complication of anesthesia   . Dermoid cyst of ovary    L  . Endometriosis   . Factor V Leiden mutation ([place])   . Pneumonia   . PONV (postoperative nausea and vomiting)    AFTER ABLATION  . Pre-syncope 02-2015  . Quadrantanopsia    R inferior   . Seasonal allergies   . Shortness of breath dyspnea   . Stroke ([place])    In [place] HAPPENED IN THE WOMB-PT HAS NO PERIPHERAL VISION BECAUSE OF THIS    Patient Active Problem List   Diagnosis Date Noted  . H/O abdominal hysterectomy 11/21/2015  . Seasonal allergies 11/21/2015  . Chronic anxiety 11/21/2015  . Pain, abdominal, nonspecific 11/21/2015  . 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 Chronic pain 04/06/2015  . Chronic pelvic pain in female (Location of Primary Source of Pain) (belt-like around her entire pelvis) 04/06/2015  . Chronic low back pain 04/06/2015  . Chronic SI joint pain (Left) 04/06/2015  . Factor V deficiency (Leiden mutation) ([place]) (Prone to coagulation) 11/01/2014  .  History of stroke 04/06/2010    Past Surgical History:  Procedure Laterality Date  . ABDOMINAL HYSTERECTOMY  07/26/2015   left ovary remaining  . BREAST LUMPECTOMY Right 2004   R breast/ Dr Jamal Collin  . CESAREAN SECTION  2002 and 2011  . DILATION AND CURETTAGE OF UTERUS  2000  . ENDOMETRIAL ABLATION  2015  . LAPAROSCOPIC OVARIAN CYSTECTOMY Left 03/01/2015   Procedure: LEFT LAPAROSCOPIC OVARIAN CYSTECTOMY, bilateral tubal cystectomies;  Surgeon: Gae Dry, MD;  Location: [facility] ORS;  Service: Gynecology;  Laterality: Left;  . LAPAROSCOPY N/A 03/01/2015   Procedure: LAPAROSCOPY OPERATIVE;  Surgeon: Gae Dry, MD;  Location: [facility] ORS;  Service: Gynecology;  Laterality: N/A;  . NASAL SINUS SURGERY  2014  . oophrectomy Right 2010  . TONSILLECTOMY  2009  . tummy tuck  2012    Prior to Admission medications   Medication Sig Start Date End Date Taking? Authorizing Provider  albuterol (PROAIR HFA) 108 (90 Base) MCG/ACT inhaler Inhale 2 puffs into the lungs as needed. 11/21/15   Guadalupe Maple, MD  amphetamine-dextroamphetamine (ADDERALL XR) 30 MG 24 hr capsule 30 mg daily.  01/31/15   Historical Provider, MD  aspirin 81 MG EC tablet Take 81 mg by mouth daily.      Historical Provider, MD  cholecalciferol (VITAMIN D) 1000 units tablet Take 1,000 Units by mouth daily.    Historical Provider, MD  DULoxetine (CYMBALTA)  60 MG capsule Take 60 mg by mouth daily.    Historical Provider, MD  ibuprofen (ADVIL,MOTRIN) 600 MG tablet Take 1 tablet (600 mg total) by mouth every 8 (eight) hours as needed. 01/20/15   Paulette Blanch, MD  loratadine (CLARITIN) 10 MG tablet Take 10 mg by mouth daily.    Historical Provider, MD  LORazepam (ATIVAN) 0.5 MG tablet Take 1 tablet (0.5 mg total) by mouth as needed for anxiety. 11/21/15   Guadalupe Maple, MD    Allergies Azithromycin and Codeine  Family History  Problem Relation Age of Onset  . Cancer Maternal Grandfather     prostate  . Diabetes Maternal  Grandfather   . Parkinson's disease Father   . Diabetes Maternal Grandmother   . Cancer Maternal Grandmother     throat    Social History Social History  Substance Use Topics  . Smoking status: Former Smoker    Packs/day: 0.25    Years: 2.00    Types: Cigarettes    Quit date: 02/24/2002  . Smokeless tobacco: Never Used     Comment: quit in 2007   . Alcohol use Yes     Comment: 1 glass of wine/week     Review of Systems  Constitutional: Negative for fever. Cardiovascular: Negative for chest pain. Respiratory: Negative for shortness of breath. Gastrointestinal: Negative for abdominal pain, vomiting and diarrhea. Neurological: Negative for headaches, focal weakness or numbness. Psychiatric: Depression. 10-point ROS otherwise negative.  ____________________________________________   PHYSICAL EXAM:  VITAL SIGNS: ED Triage Vitals  Enc Vitals Group     BP 05/03/16 2152 (!) 136/91     Pulse Rate 05/03/16 2152 94     Resp 05/03/16 2152 20     Temp 05/03/16 2152 98.3 F (36.8 C)     Temp Source 05/03/16 2152 Oral     SpO2 05/03/16 2152 100 %     Weight 05/03/16 2153 130 lb (59 kg)     Height 05/03/16 2153 5\' 2"  (1.575 m)     Head Circumference --      Peak Flow --      Pain Score 05/03/16 2154 5   Constitutional: Alert and oriented. Well appearing and in no distress. Eyes: Conjunctivae are normal. Normal extraocular movements. ENT   Head: Normocephalic and atraumatic.   Nose: No congestion/rhinnorhea.   Mouth/Throat: Mucous membranes are moist.   Neck: No stridor. Cardiovascular: Normal rate, regular rhythm.  No murmurs, rubs, or gallops.  Respiratory: Normal respiratory effort without tachypnea nor retractions. Breath sounds are clear and equal bilaterally. No wheezes/rales/rhonchi. Gastrointestinal: Soft and non tender. No rebound. No guarding.  Genitourinary: Deferred Musculoskeletal: Normal range of motion in all extremities. Neurologic:  Normal  speech and language. No gross focal neurologic deficits are appreciated.  Skin:  Skin is warm, dry and intact. No rash noted. Psychiatric: Depressed.  ____________________________________________    LABS (pertinent positives/negatives)  Labs Reviewed  ACETAMINOPHEN LEVEL - Abnormal; Notable for the following:       Result Value   Acetaminophen (Tylenol), Serum <10 (*)    All other components within normal limits  URINE DRUG SCREEN, QUALITATIVE ([facility] ONLY) - Abnormal; Notable for the following:    Amphetamines, Ur Screen POSITIVE (*)    All other components within normal limits  COMPREHENSIVE METABOLIC PANEL  ETHANOL  SALICYLATE LEVEL  CBC  POC URINE PREG, ED  POCT PREGNANCY, URINE     ____________________________________________   EKG  None  ____________________________________________    RADIOLOGY  None  ____________________________________________   PROCEDURES  Procedures  ____________________________________________   INITIAL IMPRESSION / ASSESSMENT AND PLAN / ED COURSE  Pertinent labs & imaging results that were available during my care of the patient were reviewed by me and considered in my medical decision making (see chart for details).  Patient presented to the emergency department today because of concerns for depression. Patient does not have any plans to harm herself. Will have patient be seen by specialist on call. This point patient is voluntary.  ____________________________________________   FINAL CLINICAL IMPRESSION(S) / ED DIAGNOSES  Final diagnoses:  Depression, unspecified depression type     Note: This dictation was prepared with Dragon dictation. Any transcriptional errors that result from this process are unintentional     Nance Pear, MD 05/03/16 2249

## 2016-05-03 NOTE — ED Notes (Signed)
Patients girlfriend took belongings home.

## 2016-05-03 NOTE — ED Notes (Signed)
Pt states having a lot of stressors during the past year and had a disagreement with her teenage son that according to the pt "broke the camel's back". Pt states she moved out of the house she was staying with husband/boyfriend, the father of the teenage son, and got her own place and now she lives with a girlfriend; according to the pt, the son doesn't want anything to do with her now; this along with other disagreements with family members has led to her feeling despair.  Pt states taking anti depressive medications. Pt is alert and oriented x4.

## 2016-05-04 ENCOUNTER — Inpatient Hospital Stay
Admission: EM | Admit: 2016-05-04 | Discharge: 2016-05-06 | DRG: 885 | Disposition: A | Discharge status: Home / Self Care | Payer: BLUE CROSS/BLUE SHIELD | Source: Intra-hospital | Attending: Psychiatry | Admitting: Psychiatry

## 2016-05-04 DIAGNOSIS — Z881 Allergy status to other antibiotic agents status: Secondary | ICD-10-CM

## 2016-05-04 DIAGNOSIS — F4323 Adjustment disorder with mixed anxiety and depressed mood: Secondary | ICD-10-CM

## 2016-05-04 DIAGNOSIS — Z87891 Personal history of nicotine dependence: Secondary | ICD-10-CM | POA: Diagnosis not present

## 2016-05-04 DIAGNOSIS — F909 Attention-deficit hyperactivity disorder, unspecified type: Secondary | ICD-10-CM | POA: Diagnosis present

## 2016-05-04 DIAGNOSIS — F332 Major depressive disorder, recurrent severe without psychotic features: Principal | ICD-10-CM | POA: Diagnosis present

## 2016-05-04 DIAGNOSIS — J302 Other seasonal allergic rhinitis: Secondary | ICD-10-CM | POA: Diagnosis present

## 2016-05-04 DIAGNOSIS — D6851 Activated protein C resistance: Secondary | ICD-10-CM | POA: Diagnosis present

## 2016-05-04 DIAGNOSIS — F429 Obsessive-compulsive disorder, unspecified: Secondary | ICD-10-CM | POA: Diagnosis present

## 2016-05-04 DIAGNOSIS — F4310 Post-traumatic stress disorder, unspecified: Secondary | ICD-10-CM | POA: Diagnosis present

## 2016-05-04 DIAGNOSIS — E559 Vitamin D deficiency, unspecified: Secondary | ICD-10-CM | POA: Diagnosis present

## 2016-05-04 DIAGNOSIS — Z885 Allergy status to narcotic agent status: Secondary | ICD-10-CM | POA: Diagnosis not present

## 2016-05-04 DIAGNOSIS — Z808 Family history of malignant neoplasm of other organs or systems: Secondary | ICD-10-CM | POA: Diagnosis not present

## 2016-05-04 DIAGNOSIS — Z79899 Other long term (current) drug therapy: Secondary | ICD-10-CM | POA: Diagnosis not present

## 2016-05-04 DIAGNOSIS — Z833 Family history of diabetes mellitus: Secondary | ICD-10-CM

## 2016-05-04 DIAGNOSIS — D682 Hereditary deficiency of other clotting factors: Secondary | ICD-10-CM | POA: Diagnosis present

## 2016-05-04 DIAGNOSIS — R45851 Suicidal ideations: Secondary | ICD-10-CM | POA: Diagnosis present

## 2016-05-04 DIAGNOSIS — Z90710 Acquired absence of both cervix and uterus: Secondary | ICD-10-CM

## 2016-05-04 DIAGNOSIS — Z820 Family history of epilepsy and other diseases of the nervous system: Secondary | ICD-10-CM

## 2016-05-04 DIAGNOSIS — G4700 Insomnia, unspecified: Secondary | ICD-10-CM | POA: Diagnosis present

## 2016-05-04 DIAGNOSIS — G4710 Hypersomnia, unspecified: Secondary | ICD-10-CM | POA: Diagnosis present

## 2016-05-04 DIAGNOSIS — Z818 Family history of other mental and behavioral disorders: Secondary | ICD-10-CM

## 2016-05-04 DIAGNOSIS — Z7982 Long term (current) use of aspirin: Secondary | ICD-10-CM

## 2016-05-04 DIAGNOSIS — F329 Major depressive disorder, single episode, unspecified: Secondary | ICD-10-CM | POA: Diagnosis not present

## 2016-05-04 DIAGNOSIS — Z8673 Personal history of transient ischemic attack (TIA), and cerebral infarction without residual deficits: Secondary | ICD-10-CM

## 2016-05-04 MED ORDER — ASPIRIN EC 81 MG PO TBEC: 81.0000 mg | DELAYED_RELEASE_TABLET | Freq: Every day | ORAL | Status: DC

## 2016-05-04 MED ORDER — LORATADINE 10 MG PO TABS: 10.0000 mg | ORAL_TABLET | Freq: Every day | ORAL | Status: DC

## 2016-05-04 MED ORDER — DULOXETINE HCL 60 MG PO CPEP: 60.0000 mg | ORAL_CAPSULE | Freq: Every day | ORAL | Status: DC

## 2016-05-04 MED ORDER — DULOXETINE HCL 30 MG PO CPEP: 60.0000 mg | ORAL_CAPSULE | Freq: Every day | ORAL | Status: DC

## 2016-05-04 MED ORDER — ACETAMINOPHEN 325 MG PO TABS
650.0000 mg | ORAL_TABLET | Freq: Four times a day (QID) | ORAL | Status: DC | PRN
  Administered 2016-05-04: 650 mg via ORAL
  Filled 2016-05-04: qty 2

## 2016-05-04 MED ORDER — AMPHETAMINE-DEXTROAMPHETAMINE 5 MG PO TABS: 15.0000 mg | ORAL_TABLET | Freq: Two times a day (BID) | ORAL | Status: DC

## 2016-05-04 MED ORDER — LORAZEPAM 0.5 MG PO TABS
0.5000 mg | ORAL_TABLET | ORAL | Status: DC | PRN
  Administered 2016-05-04: 0.5 mg via ORAL
  Filled 2016-05-04: qty 1

## 2016-05-04 MED ORDER — MAGNESIUM HYDROXIDE 400 MG/5ML PO SUSP: 30.0000 mL | Freq: Every day | ORAL | Status: DC | PRN

## 2016-05-04 MED ORDER — LORATADINE 10 MG PO TABS
10.0000 mg | ORAL_TABLET | Freq: Every day | ORAL | Status: DC
  Administered 2016-05-05 – 2016-05-06 (×2): 10 mg via ORAL
  Filled 2016-05-04 (×2): qty 1

## 2016-05-04 MED ORDER — ASPIRIN EC 81 MG PO TBEC
81.0000 mg | DELAYED_RELEASE_TABLET | Freq: Every day | ORAL | Status: DC
  Administered 2016-05-05 – 2016-05-06 (×2): 81 mg via ORAL
  Filled 2016-05-04 (×2): qty 1

## 2016-05-04 MED ORDER — DULOXETINE HCL 60 MG PO CPEP
60.0000 mg | ORAL_CAPSULE | Freq: Every day | ORAL | Status: DC
  Administered 2016-05-04: 60 mg via ORAL
  Filled 2016-05-04: qty 1

## 2016-05-04 MED ORDER — ALBUTEROL SULFATE HFA 108 (90 BASE) MCG/ACT IN AERS: 2.0000 | INHALATION_SPRAY | RESPIRATORY_TRACT | Status: DC | PRN

## 2016-05-04 MED ORDER — ALUM & MAG HYDROXIDE-SIMETH 200-200-20 MG/5ML PO SUSP: 30.0000 mL | ORAL | Status: DC | PRN

## 2016-05-04 MED ORDER — DULOXETINE HCL 30 MG PO CPEP
60.0000 mg | ORAL_CAPSULE | Freq: Every day | ORAL | Status: DC
  Administered 2016-05-05: 60 mg via ORAL
  Filled 2016-05-04: qty 2

## 2016-05-04 MED ORDER — LORAZEPAM 0.5 MG PO TABS
0.5000 mg | ORAL_TABLET | Freq: Two times a day (BID) | ORAL | Status: DC
  Administered 2016-05-04 – 2016-05-06 (×4): 0.5 mg via ORAL
  Filled 2016-05-04 (×4): qty 1

## 2016-05-04 MED ORDER — AMPHETAMINE-DEXTROAMPHETAMINE 5 MG PO TABS
15.0000 mg | ORAL_TABLET | Freq: Two times a day (BID) | ORAL | Status: DC
  Filled 2016-05-04: qty 3

## 2016-05-04 MED ORDER — IBUPROFEN 800 MG PO TABS
800.0000 mg | ORAL_TABLET | Freq: Once | ORAL | Status: AC
  Administered 2016-05-04: 800 mg via ORAL
  Filled 2016-05-04: qty 1

## 2016-05-04 MED ORDER — ALBUTEROL SULFATE (2.5 MG/3ML) 0.083% IN NEBU
3.0000 mL | INHALATION_SOLUTION | RESPIRATORY_TRACT | Status: DC | PRN
  Filled 2016-05-04: qty 3

## 2016-05-04 NOTE — ED Provider Notes (Signed)
-----------------------------------------   1:56 AM on 05/04/2016 -----------------------------------------  Patient evaluated by [facility] psychiatrist who recommends inpatient admission. Recommends continuing Cymbalta and Ativan but holding patient's Adderall.   Paulette Blanch, MD 05/04/16 [phone]

## 2016-05-04 NOTE — ED Notes (Signed)
PT VOLUNTARY PENDING PLACEMENT TO [place].

## 2016-05-04 NOTE — ED Notes (Signed)
Pt given breakfast tray

## 2016-05-04 NOTE — ED Notes (Signed)
Pt tearful upon approach. Pt told RN she has had a horrible year.  "My son won't have anything to do with me. I don't want to feel anything right now."  Pt administered PRN Ativan as ordered. Pt made phone call. Pt is cooperative and pleasant. Maintained on 15 minute checks and observation by security camera for safety.

## 2016-05-04 NOTE — ED Notes (Signed)
SOC in process

## 2016-05-04 NOTE — ED Notes (Signed)
Patient asleep in room. No noted distress or abnormal behavior. Will continue 15 minute checks and observation by security cameras for safety. 

## 2016-05-04 NOTE — ED Provider Notes (Signed)
-----------------------------------------   3:21 PM on 05/04/2016 -----------------------------------------  The patient has been seen and evaluated by psychiatry there will be admitted to their service for further treatment.   Harvest Dark, MD 05/04/16 [phone]

## 2016-05-04 NOTE — ED Notes (Addendum)
Pt made aware she will be admitted to [facility] this afternoon. Pt has signed Voluntary Admission consent. Belongings were sent home with girlfriend. Maintained on 15 minute checks and observation by security camera for safety.

## 2016-05-04 NOTE — ED Notes (Signed)
Report called to Jennifer, RN.
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
## 2016-05-04 NOTE — Progress Notes (Signed)
LCSW in ED collected patient information to complete assessment for BMU, will enter data in early am. Verbal consent given to speak to her girlfriend Azzie Almas phone # [phone]    Enis Slipper LCSW [phone]

## 2016-05-04 NOTE — ED Notes (Signed)
Pt is Voluntary and has signed consent form for treatment. Belongings were sent home with girlfriend. Escorted to BMU by RN.

## 2016-05-04 NOTE — ED Notes (Signed)
Pt will be admitted to inpatient unit per psychiatrist. RN gave pt a description of the unit / what to expect. Pt accepting.  Maintained on 15 minute checks and observation by security camera for safety.

## 2016-05-04 NOTE — ED Notes (Signed)
Pt ate 100% of breakfast and lunch 

## 2016-05-04 NOTE — BH Assessment (Signed)
Patient is to be admitted to [facility] by Dr. Weber Cooks.  Attending Physician will be Dr. Jerilee Hoh.   Patient has been assigned to room 303-B, by [place].   Intake Paper Work has been signed and placed on patient chart.  ER staff is aware of the admission Raquel Sarna, ER Sect.; Dr. Kerman Passey, ER MD; Mateo Flow, [place], Patient Access).

## 2016-05-05 DIAGNOSIS — F332 Major depressive disorder, recurrent severe without psychotic features: Principal | ICD-10-CM

## 2016-05-05 DIAGNOSIS — F909 Attention-deficit hyperactivity disorder, unspecified type: Secondary | ICD-10-CM

## 2016-05-05 LAB — VITAMIN D 25 HYDROXY (VIT D DEFICIENCY, FRACTURES): Vit D, 25-Hydroxy: 31.1 ng/mL (ref 30.0–100.0)

## 2016-05-05 MED ORDER — TRAZODONE HCL 50 MG PO TABS
50.0000 mg | ORAL_TABLET | Freq: Once | ORAL | Status: DC
  Filled 2016-05-05: qty 1

## 2016-05-05 MED ORDER — DULOXETINE HCL 30 MG PO CPEP
90.0000 mg | ORAL_CAPSULE | Freq: Every day | ORAL | Status: DC
  Administered 2016-05-06: 90 mg via ORAL
  Filled 2016-05-05: qty 3

## 2016-05-05 MED ORDER — DULOXETINE HCL 30 MG PO CPEP
30.0000 mg | ORAL_CAPSULE | Freq: Once | ORAL | Status: AC
  Administered 2016-05-05: 30 mg via ORAL
  Filled 2016-05-05: qty 1
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
## 2016-05-05 NOTE — Plan of Care (Signed)
Problem: Safety: Goal: Ability to remain free from injury will improve Outcome: Progressing Pt progressing well towards accomplishing this goal, has remained free from injury this shift.

## 2016-05-05 NOTE — Progress Notes (Signed)
D: Patient is alert and oriented on the unit this shift. Patient attended and actively participated in groups today. Patient denies suicidal ideation, homicidal ideation, auditory or visual hallucinations at the present time.  A: Scheduled medications are administered to patient as per MD orders. Emotional support and encouragement are provided. Patient is maintained on q.15 minute safety checks. Patient is informed to notify staff with questions or concerns. R: No adverse medication reactions are noted. Patient is cooperative with medication administration and treatment plan today. Patient is receptive, calm ,depressd and cooperative on the unit at this time. Patient interacts  with others on the unit this shift. Patient contracts for safety at this time. Patient remains safe at this time. Depression 8/10 Anxiety 6/10

## 2016-05-05 NOTE — BHH Group Notes (Signed)
[place] Group Notes:  (Nursing/MHT/Case Management/Adjunct)  Date:  05/05/2016  Time:  5:47 AM  Type of Therapy:  Psychoeducational Skills  Participation Level:  Did Not Attend    Summary of Progress/Problems:  Reece Agar 05/05/2016, 5:47 AM

## 2016-05-05 NOTE — Plan of Care (Signed)
Problem: Coping: Goal: Ability to identify and develop effective coping behavior will improve Outcome: Not Progressing Patient has not learned coping mechanisms at this time [facility] RN

## 2016-05-05 NOTE — BHH Group Notes (Signed)
[place] Group Notes:  (Nursing/MHT/Case Management/Adjunct)  Date:  05/05/2016  Time:  9:48 PM  Type of Therapy:  Evening Wrap-up Group  Participation Level:  Active  Participation Quality:  Appropriate and Attentive  Affect:  Appropriate  Cognitive:  Alert and Appropriate  Insight:  Appropriate, Good and Improving  Engagement in Group:  Engaged  Modes of Intervention:  Discussion  Summary of Progress/Problems:  Karen Marsh 05/05/2016, 9:48 PM

## 2016-05-05 NOTE — Progress Notes (Signed)
Pt has been calm and cooperative this shift, although still voicing anxiety and requesting ativan. Pt got morning dose of scheduled ativan but requested for the PRN does after lunch, pt instructed that PRN dose has been discontinue. Pt's mood remains depressed but pt denies any SI/HI/VAH. Remains compliant with meds, meals and groups. Pt's Cymbalta increased to 90mg . Will continue to monitor for safety.

## 2016-05-05 NOTE — Progress Notes (Signed)
D: Pt denies SI/HI/AVH. Pt is pleasant and cooperative. Pt only focus has been on her Ativan. Pt was pleasant with Probation officer, forwarded little information.   A: Pt was offered support and encouragement. Pt was given scheduled medications. Pt was encourage to attend groups. Q 15 minute checks were done for safety.  R:Pt attends groups and interacts well with peers and staff. Pt is taking medication. Pt has no complaints.Pt receptive to treatment and safety maintained on unit.

## 2016-05-05 NOTE — Plan of Care (Signed)
Problem: Safety: Goal: Ability to remain free from injury will improve Outcome: Progressing Pt safe on the unit at this time   

## 2016-05-05 NOTE — BHH Suicide Risk Assessment (Signed)
[facility] Admission Suicide Risk Assessment   Nursing information obtained from:    Demographic factors:    Current Mental Status:    Loss Factors:    Historical Factors:    Risk Reduction Factors:     Total Time spent with patient: 1 hour Principal Problem: Severe recurrent major depression without psychotic features [facility]) Diagnosis:   Patient Active Problem List   Diagnosis Date Noted  . Attention deficit hyperactivity disorder (ADHD) [F90.9] 05/05/2016  . Severe recurrent major depression without psychotic features ([place]) [F33.2] 05/04/2016  . Seasonal allergies [J30.2] 11/21/2015  . 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 History of stroke [Z86.73] 04/06/2010   Subjective Data:   Continued Clinical Symptoms:  Alcohol Use Disorder Identification Test Final Score (AUDIT): 3 The "Alcohol Use Disorders Identification Test", Guidelines for Use in Primary Care, Second Edition.  World Pharmacologist [facility]). Score between 0-7:  no or low risk or alcohol related problems. Score between 8-15:  moderate risk of alcohol related problems. Score between 16-19:  high risk of alcohol related problems. Score 20 or above:  warrants further diagnostic evaluation for alcohol dependence and treatment.   CLINICAL FACTORS:   Severe Anxiety and/or Agitation Depression:   Insomnia Severe More than one psychiatric diagnosis Previous Psychiatric Diagnoses and Treatments   Musculoskeletal:  Psychiatric Specialty Exam: Physical Exam  ROS  Blood pressure 124/82, pulse 79, temperature 97.7 F (36.5 C), temperature source Oral, resp. rate 20, last menstrual period 04/24/2015, SpO2 100 %.There is no height or weight on file to calculate BMI.                                                    Sleep:  Number of Hours: 7      COGNITIVE FEATURES THAT CONTRIBUTE TO RISK:  None    SUICIDE RISK:   Mild:  Suicidal ideation of  limited frequency, intensity, duration, and specificity.  There are no identifiable plans, no associated intent, mild dysphoria and related symptoms, good self-control (both objective and subjective assessment), few other risk factors, and identifiable protective factors, including available and accessible social support.  PLAN OF CARE: admit to [facility]  I certify that inpatient services furnished can reasonably be expected to improve the patient's condition.   Hildred Priest, MD 05/05/2016, 2:14 PM

## 2016-05-05 NOTE — Plan of Care (Signed)
Problem: Safety: Goal: Ability to remain free from injury will improve Outcome: Progressing Pt has remained free from injury this shift.   

## 2016-05-05 NOTE — BHH Group Notes (Signed)
[facility] LCSW Group Therapy  05/05/2016 1.00pm  Type of Therapy:  Group Therapy  Participation Level:  Active  Participation Quality:  Appropriate  Affect:  Appropriate  Cognitive:  Alert  Insight:  Developing/Improving  Engagement in Therapy:  Improving  Modes of Intervention:  Activity, Discussion, Education, Problem-solving, Reality Testing and Support  Summary of Progress/Problems: Balance in life: Patients will discuss the concept of balance and how it looks and feels to be unbalanced. Pt will identify areas in their life that is unbalanced and ways to become more balanced. They discussed what aspects in their lives has influenced their self care. Patients also discussed self care in the areas of self regulation/control, hygiene/appearance, sleep/relaxation, healthy leisure, healthy eating habits, exercise, inner peace/spirituality, self improvement, sobriety, and health management. They were challenged to identify changes that are needed in order to improve self care.  This patient was able to follow her peer group and contribute. She was supportive and patient to her peers.   Jianni Batten M. Shellee Milo 05/05/2016

## 2016-05-06 MED ORDER — DULOXETINE HCL 30 MG PO CPEP: 90.0000 mg | ORAL_CAPSULE | Freq: Every day | ORAL | 0 refills | Status: DC

## 2016-05-06 MED ORDER — LORAZEPAM 0.5 MG PO TABS: 0.5000 mg | ORAL_TABLET | Freq: Two times a day (BID) | ORAL | 0 refills | Status: DC | PRN

## 2016-05-06 MED ORDER — TRAZODONE HCL 50 MG PO TABS: 50.0000 mg | ORAL_TABLET | Freq: Every evening | ORAL | 0 refills | Status: DC | PRN
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
## 2016-05-06 NOTE — Progress Notes (Signed)
Patient's discharge teaching, instructions and medications/prescriptions discussed with patient. She verbalized understanding. Patient's belongings were given back to patient without any concerns verbalized. Patient's friend [place]. No concerns verbalized and patient was excited to leave.
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
## 2016-05-06 NOTE — Progress Notes (Signed)
  [facility] Adult Case Management Discharge Plan :  Will you be returning to the same living situation after discharge:  Yes,  with friend At discharge, do you have transportation home?: Yes,  friend Do you have the ability to pay for your medications: Yes,  patient has insurance  Release of information consent forms completed and in the chart;  Patient's signature needed at discharge.  Patient to Follow up at: Follow-up Information    [facility] Follow up on 05/07/2016.   Why:  Follow-up appointment on this date with your Therapist Caren Griffins at 11:00am. Bring insurance card, current medications, and discharge summary to appointment.  Contact information: [place], [place] [address] P: [place]. Call on 05/07/2016.   Why:  Dr. Nicolasa Ducking was made aware of your discharge. Call her office tomorrow to schedule follow-up appointment.  Contact information: [address], [place], [address] P: [phone] F: [phone]          Next level of care provider has access to [place] and Suicide Prevention discussed: Yes,  with patient and friend  Have you used any form of tobacco in the last 30 days? (Cigarettes, Smokeless Tobacco, Cigars, and/or Pipes): No  Has patient been referred to the Quitline?: N/A patient is not a smoker  Patient has been referred for addiction treatment: Yes  Chantee Cerino G. [place], [place] 05/06/2016, 11:54 AM

## 2016-05-06 NOTE — BHH Suicide Risk Assessment (Signed)
[place] INPATIENT:  Family/Significant Other Suicide Prevention Education  Suicide Prevention Education:  Education Completed;Karen Marsh(friend [phone]), has been identified by the patient as the family member/significant other with whom the patient will be residing, and identified as the person(s) who will aid the patient in the event of a mental health crisis (suicidal ideations/suicide attempt).  With written consent from the patient, the family member/significant other has been provided the following suicide prevention education, prior to the and/or following the discharge of the patient.  The suicide prevention education provided includes the following:  Suicide risk factors  Suicide prevention and interventions  National Suicide Hotline telephone number  [facility] assessment telephone number  [facility] Emergency Assistance [place] and/or Residential Mobile Crisis Unit telephone number  Request made of family/significant other to:  Remove weapons (e.g., guns, rifles, knives), all items previously/currently identified as safety concern. Confirmed that patient will not have access to firearms.    Remove drugs/medications (over-the-counter, prescriptions, illicit drugs), all items previously/currently identified as a safety concern.  The family member/significant other verbalizes understanding of the suicide prevention education information provided.  The family member/significant other agrees to remove the items of safety concern listed above.  Karen Marsh G. [place], [place] 05/06/2016, 11:29 AM
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
## 2016-05-06 NOTE — Progress Notes (Addendum)
Patient ID: Karen Marsh, female   DOB: 02-27-1981, 36 y.o.   MRN: [id number]  CSW contacted Dr. Nicolasa Ducking, MD who is the psychiatrist that works with patient for outpatient services. MD stated that patient does not have any existing appointments scheduled but patient would just need to call tomorrow to schedule follow-up appointment. MD was made aware that patient is discharging today.   Loraine Bhullar G. [place], [facility] 05/06/2016 11:46 AM

## 2016-05-06 NOTE — Plan of Care (Signed)
Problem: Health Behavior/Discharge Planning: Goal: Identification of resources available to assist in meeting health care needs will improve Outcome: Progressing Patient mentions her family as resource in assisting her to meet her health care needs.

## 2016-05-15 ENCOUNTER — Emergency Department: Payer: BLUE CROSS/BLUE SHIELD

## 2016-05-15 ENCOUNTER — Encounter: Payer: Self-pay | Admitting: Emergency Medicine

## 2016-05-15 ENCOUNTER — Emergency Department
Admission: EM | Admit: 2016-05-15 | Discharge: 2016-05-15 | Disposition: A | Discharge status: Home / Self Care | Payer: BLUE CROSS/BLUE SHIELD | Attending: Emergency Medicine | Admitting: Emergency Medicine

## 2016-05-15 DIAGNOSIS — R1033 Periumbilical pain: Secondary | ICD-10-CM | POA: Diagnosis not present

## 2016-05-15 DIAGNOSIS — R1011 Right upper quadrant pain: Secondary | ICD-10-CM | POA: Insufficient documentation

## 2016-05-15 DIAGNOSIS — R112 Nausea with vomiting, unspecified: Secondary | ICD-10-CM | POA: Diagnosis present

## 2016-05-15 DIAGNOSIS — R1084 Generalized abdominal pain: Secondary | ICD-10-CM | POA: Insufficient documentation

## 2016-05-15 DIAGNOSIS — Z87891 Personal history of nicotine dependence: Secondary | ICD-10-CM | POA: Insufficient documentation

## 2016-05-15 DIAGNOSIS — R1012 Left upper quadrant pain: Secondary | ICD-10-CM | POA: Insufficient documentation

## 2016-05-15 LAB — COMPREHENSIVE METABOLIC PANEL
ALT: 26 U/L (ref 14–54)
ANION GAP: 8 (ref 5–15)
AST: 19 U/L (ref 15–41)
Albumin: 4.4 g/dL (ref 3.5–5.0)
Alkaline Phosphatase: 52 U/L (ref 38–126)
BUN: 12 mg/dL (ref 6–20)
CHLORIDE: 103 mmol/L (ref 101–111)
CO2: 25 mmol/L (ref 22–32)
Calcium: 8.9 mg/dL (ref 8.9–10.3)
Creatinine, Ser: 0.61 mg/dL (ref 0.44–1.00)
GFR calc Af Amer: >60 mL/min (ref >60)
Glucose, Bld: 103 mg/dL — ABNORMAL HIGH (ref 65–99)
POTASSIUM: 3.7 mmol/L (ref 3.5–5.1)
Sodium: 136 mmol/L (ref 135–145)
TOTAL PROTEIN: 7.6 g/dL (ref 6.5–8.1)
Total Bilirubin: 0.8 mg/dL (ref 0.3–1.2)

## 2016-05-15 LAB — CBC
HEMATOCRIT: 40.5 % (ref 35.0–47.0)
HEMOGLOBIN: 14 g/dL (ref 12.0–16.0)
MCH: 30.1 pg (ref 26.0–34.0)
MCHC: 34.5 g/dL (ref 32.0–36.0)
MCV: 87.1 fL (ref 80.0–100.0)
Platelets: 340 10*3/uL (ref 150–440)
RBC: 4.65 MIL/uL (ref 3.80–5.20)
RDW: 13.2 % (ref 11.5–14.5)
WBC: 17.7 10*3/uL — AB (ref 3.6–11.0)

## 2016-05-15 LAB — URINALYSIS, COMPLETE (UACMP) WITH MICROSCOPIC
BACTERIA UA: NONE SEEN
BILIRUBIN URINE: NEGATIVE
Glucose, UA: NEGATIVE mg/dL
HGB URINE DIPSTICK: NEGATIVE
KETONES UR: NEGATIVE mg/dL
LEUKOCYTES UA: NEGATIVE
NITRITE: NEGATIVE
Protein, ur: NEGATIVE mg/dL
Specific Gravity, Urine: 1.019 (ref 1.005–1.030)
pH: 7 (ref 5.0–8.0)

## 2016-05-15 LAB — LIPASE, BLOOD: LIPASE: 28 U/L (ref 11–51)

## 2016-05-15 MED ORDER — ONDANSETRON 4 MG PO TBDP
ORAL_TABLET | ORAL | Status: AC
  Filled 2016-05-15: qty 1

## 2016-05-15 MED ORDER — MORPHINE SULFATE (PF) 4 MG/ML IV SOLN
4.0000 mg | Freq: Once | INTRAVENOUS | Status: AC
  Administered 2016-05-15: 4 mg via INTRAVENOUS
  Filled 2016-05-15: qty 1

## 2016-05-15 MED ORDER — IOPAMIDOL (ISOVUE-300) INJECTION 61%
30.0000 mL | Freq: Once | INTRAVENOUS | Status: AC | PRN
  Administered 2016-05-15: 30 mL via ORAL
  Filled 2016-05-15: qty 30

## 2016-05-15 MED ORDER — HYDROCODONE-ACETAMINOPHEN 5-325 MG PO TABS: 1.0000 | ORAL_TABLET | ORAL | 0 refills | Status: DC | PRN

## 2016-05-15 MED ORDER — SODIUM CHLORIDE 0.9 % IV BOLUS (SEPSIS)
1000.0000 mL | Freq: Once | INTRAVENOUS | Status: AC
  Administered 2016-05-15: 1000 mL via INTRAVENOUS

## 2016-05-15 MED ORDER — POLYETHYLENE GLYCOL 3350 17 GM/SCOOP PO POWD: 17.0000 g | Freq: Every day | ORAL | 0 refills | Status: DC

## 2016-05-15 MED ORDER — IOPAMIDOL (ISOVUE-300) INJECTION 61%
100.0000 mL | Freq: Once | INTRAVENOUS | Status: AC | PRN
  Administered 2016-05-15: 100 mL via INTRAVENOUS
  Filled 2016-05-15: qty 100

## 2016-05-15 MED ORDER — PROMETHAZINE HCL 25 MG/ML IJ SOLN
25.0000 mg | Freq: Once | INTRAMUSCULAR | Status: AC
  Administered 2016-05-15: 25 mg via INTRAVENOUS
  Filled 2016-05-15 (×2): qty 1

## 2016-05-15 MED ORDER — DOCUSATE SODIUM 100 MG PO CAPS: 100.0000 mg | ORAL_CAPSULE | Freq: Two times a day (BID) | ORAL | 2 refills | Status: AC

## 2016-05-15 MED ORDER — PROMETHAZINE HCL 25 MG/ML IJ SOLN
25.0000 mg | Freq: Once | INTRAMUSCULAR | Status: AC
  Administered 2016-05-15: 25 mg via INTRAVENOUS
  Filled 2016-05-15: qty 1

## 2016-05-15 MED ORDER — ONDANSETRON 4 MG PO TBDP: 4.0000 mg | ORAL_TABLET | Freq: Three times a day (TID) | ORAL | 0 refills | Status: DC | PRN

## 2016-05-15 MED ORDER — ONDANSETRON 4 MG PO TBDP
4.0000 mg | ORAL_TABLET | Freq: Once | ORAL | Status: AC | PRN
  Administered 2016-05-15: 4 mg via ORAL

## 2016-05-15 MED ORDER — ONDANSETRON HCL 4 MG/2ML IJ SOLN
4.0000 mg | Freq: Once | INTRAMUSCULAR | Status: AC
  Administered 2016-05-15: 4 mg via INTRAVENOUS

## 2016-05-15 MED ORDER — ONDANSETRON HCL 4 MG/2ML IJ SOLN
INTRAMUSCULAR | Status: AC
  Filled 2016-05-15: qty 2

## 2016-05-15 NOTE — ED Notes (Signed)
Returned from CT.

## 2016-05-15 NOTE — ED Provider Notes (Signed)
[facility] Emergency Department Provider Note  Time seen: 6:05 PM  I have reviewed the triage vital signs and the nursing notes.   HISTORY  Chief Complaint Emesis    HPI Karen Marsh is a 36 y.o. female with a past medical history of factor V Leiden, endometriosis, status post hysterectomy, depression, who presents to the emergency department with abdominal discomfort nausea or vomiting. According to the patient beginning around 6 AM this morning she has been nauseated with several episodes of vomiting. Denies any diarrhea states moderate abdominal discomfort/cramping. Denies fever. Denies any worsening with food but states she has not had much of an appetite today. Denies dysuria. Patient is status post hysterectomy.  Past Medical History:  Diagnosis Date  . Anxiety   . Blood dyscrasia    FACTOR V LEIDEN  . Complication of anesthesia   . Dermoid cyst of ovary    L  . Endometriosis   . Factor V Leiden mutation ([place])   . Pneumonia   . PONV (postoperative nausea and vomiting)    AFTER ABLATION  . Pre-syncope 02-2015  . Quadrantanopsia    R inferior   . Seasonal allergies   . Shortness of breath dyspnea   . Stroke ([place])    In [place] HAPPENED IN THE WOMB-PT HAS NO PERIPHERAL VISION BECAUSE OF THIS    Patient Active Problem List   Diagnosis Date Noted  . Attention deficit hyperactivity disorder (ADHD) 05/05/2016  . Severe recurrent major depression without psychotic features ([place]) 05/04/2016  . Seasonal allergies 11/21/2015  . Factor V deficiency (Leiden mutation) ([place]) (Prone to coagulation) 11/01/2014  . History of stroke 04/06/2010    Past Surgical History:  Procedure Laterality Date  . ABDOMINAL HYSTERECTOMY  07/26/2015   left ovary remaining  . BREAST LUMPECTOMY Right 2004   R breast/ Dr Jamal Collin  . CESAREAN SECTION  2002 and 2011  . DILATION AND CURETTAGE OF UTERUS  2000  . ENDOMETRIAL  ABLATION  2015  . LAPAROSCOPIC OVARIAN CYSTECTOMY Left 03/01/2015   Procedure: LEFT LAPAROSCOPIC OVARIAN CYSTECTOMY, bilateral tubal cystectomies;  Surgeon: Gae Dry, MD;  Location: [facility] ORS;  Service: Gynecology;  Laterality: Left;  . LAPAROSCOPY N/A 03/01/2015   Procedure: LAPAROSCOPY OPERATIVE;  Surgeon: Gae Dry, MD;  Location: [facility] ORS;  Service: Gynecology;  Laterality: N/A;  . NASAL SINUS SURGERY  2014  . oophrectomy Right 2010  . TONSILLECTOMY  2009  . tummy tuck  2012    Prior to Admission medications   Medication Sig Start Date End Date Taking? Authorizing Provider  albuterol (PROAIR HFA) 108 (90 Base) MCG/ACT inhaler Inhale 2 puffs into the lungs as needed. Patient not taking: Reported on 05/03/2016 11/21/15   Guadalupe Maple, MD  amphetamine-dextroamphetamine (ADDERALL XR) 30 MG 24 hr capsule 30 mg daily.  01/31/15   Historical Provider, MD  aspirin 81 MG EC tablet Take 81 mg by mouth daily.      Historical Provider, MD  cholecalciferol (VITAMIN D) 1000 units tablet Take 1,000 Units by mouth daily.    Historical Provider, MD  DULoxetine (CYMBALTA) 30 MG capsule Take 3 capsules (90 mg total) by mouth daily. 05/07/16   Hildred Priest, MD  loratadine (CLARITIN) 10 MG tablet Take 10 mg by mouth daily.    Historical Provider, MD  LORazepam (ATIVAN) 0.5 MG tablet Take 1 tablet (0.5 mg total) by mouth 2 (two) times daily as needed for anxiety. 05/06/16  Hildred Priest, MD  traZODone (DESYREL) 50 MG tablet Take 1 tablet (50 mg total) by mouth at bedtime as needed for sleep. 05/06/16   Hildred Priest, MD    Allergies  Allergen Reactions  . Azithromycin Nausea And Vomiting  . Codeine     Family History  Problem Relation Age of Onset  . Cancer Maternal Grandfather     prostate  . Diabetes Maternal Grandfather   . Parkinson's disease Father   . Diabetes Maternal Grandmother   . Cancer Maternal Grandmother     throat    Social  History Social History  Substance Use Topics  . Smoking status: Former Smoker    Packs/day: 0.25    Years: 2.00    Types: Cigarettes    Quit date: 02/24/2002  . Smokeless tobacco: Never Used     Comment: quit in 2007   . Alcohol use Yes     Comment: 1 glass of wine/week     Review of Systems Constitutional: Negative for fever. Cardiovascular: Negative for chest pain. Respiratory: Negative for shortness of breath. Gastrointestinal: Mild to moderate lower abdominal discomfort/cramping Genitourinary: Negative for dysuria. Neurological: Negative for headache 10-point ROS otherwise negative.  ____________________________________________   PHYSICAL EXAM:  VITAL SIGNS: ED Triage Vitals  Enc Vitals Group     BP 05/15/16 1557 120/78     Pulse Rate 05/15/16 1557 (!) 117     Resp 05/15/16 1557 16     Temp 05/15/16 1556 98.2 F (36.8 C)     Temp Source 05/15/16 1556 Oral     SpO2 05/15/16 1557 100 %     Weight 05/15/16 1556 130 lb (59 kg)     Height 05/15/16 1556 5\' 1"  (1.549 m)     Head Circumference --      Peak Flow --      Pain Score 05/15/16 1556 6     Pain Loc --      Pain Edu? --      Excl. in [place]? --     Constitutional: Alert and oriented. Eyes: Normal exam ENT   Head: Normocephalic and atraumatic.   Mouth/Throat: Mucous membranes are moist. Cardiovascular: Normal rate, regular rhythm. No murmur Respiratory: Normal respiratory effort without tachypnea nor retractions. Breath sounds are clear  Gastrointestinal: Soft, mild periumbilical and right upper quadrant tenderness, moderate left lower quadrant tenderness. No guarding. No distention. No CVA tenderness. Musculoskeletal: Nontender with normal range of motion in all extremities. Neurologic:  Normal speech and language. No gross focal neurologic deficits Skin:  Skin is warm, dry and intact.  Psychiatric: Mood and affect are normal.   ____________________________________________      RADIOLOGY  CT  scan largely negative. Ultrasound most consistent with polyp, no gallbladder wall thickening or pericholecystic fluid.  ____________________________________________   INITIAL IMPRESSION / ASSESSMENT AND PLAN / ED COURSE  Pertinent labs & imaging results that were available during my care of the patient were reviewed by me and considered in my medical decision making (see chart for details).  Patient presents for nausea or vomiting moderate abdominal cramping/discomfort especially in the left lower quadrant. Patient had mild right upper quadrant periumbilical tenderness, however her lipase and LFTs are completely normal. Denies any association with food. Patient has moderate left lower quadrant tenderness. Given patient's elevated white blood cell count of 17,000 with left lower quadrant abdominal tenderness we'll proceed with a CT the abdomen/pelvis to further evaluate. We will treat the patient's pain and nausea and IV hydrate while awaiting  CT results.  Patient's workup is largely nonrevealing. Continues to have some nausea and abdominal discomfort. We'll place on a short course of pain medication and nausea medication. Discussed follow-up with the patient, as well as return precautions.  ____________________________________________   FINAL CLINICAL IMPRESSION(S) / ED DIAGNOSES  Abdominal discomfort Nausea and vomiting    Harvest Dark, MD 05/15/16 2139

## 2016-05-15 NOTE — ED Notes (Signed)
Patient transported to CT 

## 2016-05-15 NOTE — ED Triage Notes (Signed)
Pt c/o vomiting for 3 hours today and burping. Denies diarrhea. Feels bloated. C/o abdominal cramping. Was constipated on xray 2 weeks ago. Has had bowel movement but still feels kind of constipated.

## 2016-05-28 ENCOUNTER — Other Ambulatory Visit: Payer: Self-pay | Admitting: Student

## 2016-05-28 DIAGNOSIS — R1011 Right upper quadrant pain: Secondary | ICD-10-CM

## 2016-05-28 DIAGNOSIS — R112 Nausea with vomiting, unspecified: Secondary | ICD-10-CM

## 2016-05-28 DIAGNOSIS — K8020 Calculus of gallbladder without cholecystitis without obstruction: Secondary | ICD-10-CM

## 2016-06-18 ENCOUNTER — Encounter
Admission: RE | Admit: 2016-06-18 | Discharge: 2016-06-18 | Disposition: A | Discharge status: Home / Self Care | Payer: BLUE CROSS/BLUE SHIELD | Source: Ambulatory Visit | Attending: Student | Admitting: Student

## 2016-06-18 DIAGNOSIS — R112 Nausea with vomiting, unspecified: Secondary | ICD-10-CM | POA: Insufficient documentation

## 2016-06-18 DIAGNOSIS — K8020 Calculus of gallbladder without cholecystitis without obstruction: Secondary | ICD-10-CM

## 2016-06-18 DIAGNOSIS — R1011 Right upper quadrant pain: Secondary | ICD-10-CM | POA: Insufficient documentation

## 2016-06-25 ENCOUNTER — Encounter
Admission: RE | Admit: 2016-06-25 | Discharge: 2016-06-25 | Disposition: A | Discharge status: Home / Self Care | Payer: BLUE CROSS/BLUE SHIELD | Source: Ambulatory Visit | Attending: Student | Admitting: Student

## 2016-06-25 DIAGNOSIS — R1011 Right upper quadrant pain: Secondary | ICD-10-CM | POA: Diagnosis present

## 2016-06-25 DIAGNOSIS — R112 Nausea with vomiting, unspecified: Secondary | ICD-10-CM | POA: Insufficient documentation

## 2016-06-25 DIAGNOSIS — K8020 Calculus of gallbladder without cholecystitis without obstruction: Secondary | ICD-10-CM | POA: Insufficient documentation

## 2016-06-25 MED ORDER — TECHNETIUM TC 99M MEBROFENIN IV KIT
5.0000 | PACK | Freq: Once | INTRAVENOUS | Status: AC | PRN
  Administered 2016-06-25: 5.05 via INTRAVENOUS

## 2016-12-07 IMAGING — CR DG LUMBAR SPINE COMPLETE 4+V
5 series · 5 of 5 positions shown · non-contrast
Comparison: None.

CLINICAL DATA: Low back pain and pelvic pain and sacroiliac joint
pain.

EXAM:
LUMBAR SPINE - COMPLETE 4+ VIEW

[l-spine ap]
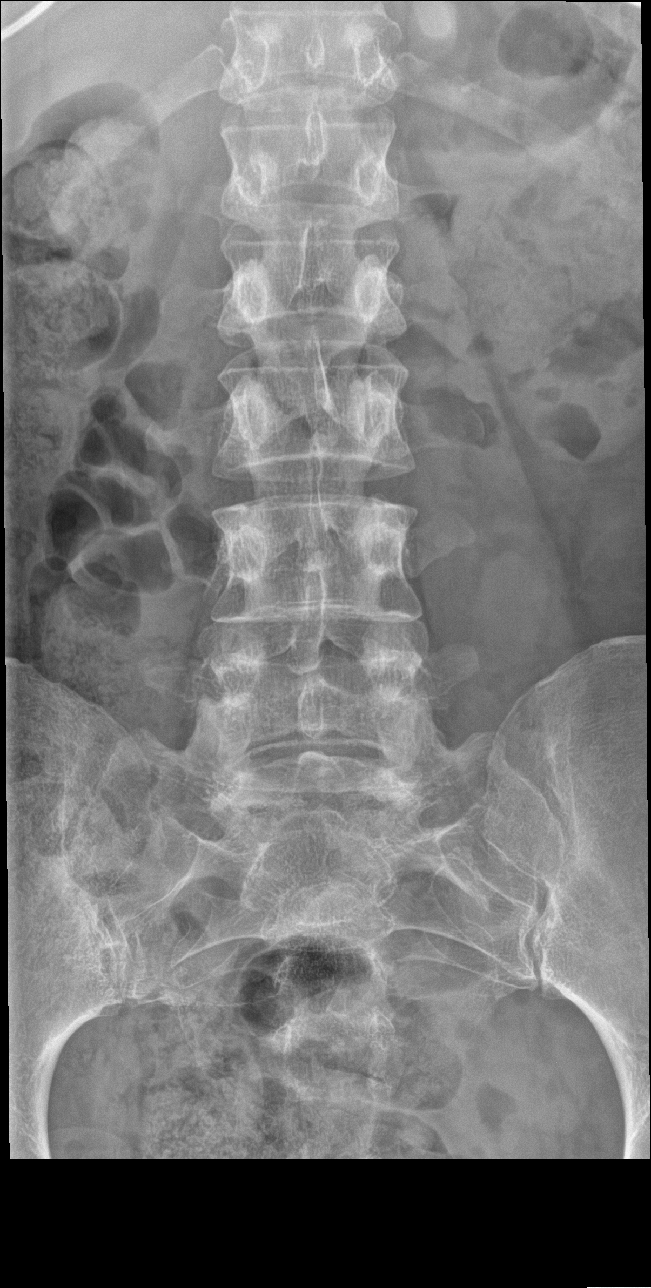

[l-spine obl (1 of 2)]
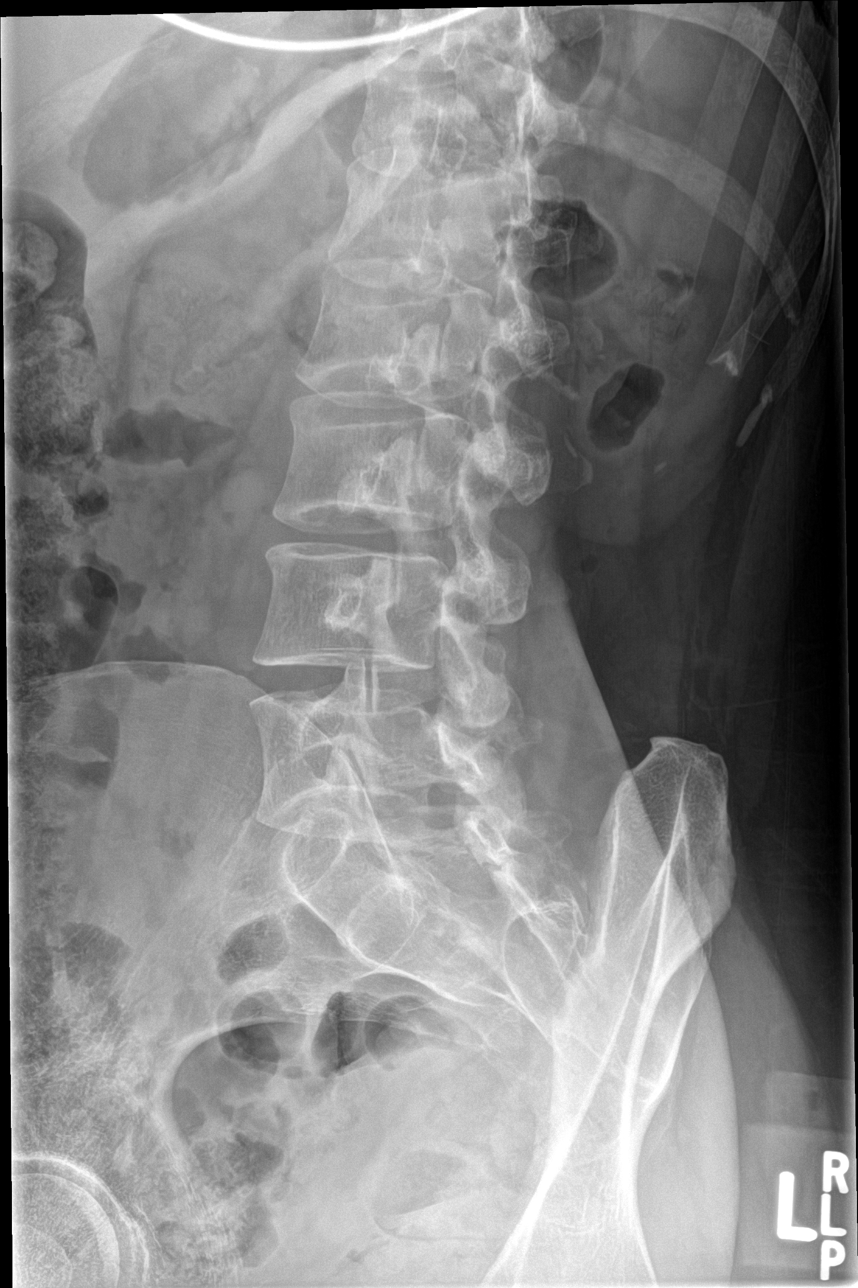

[l-spine obl (2 of 2)]
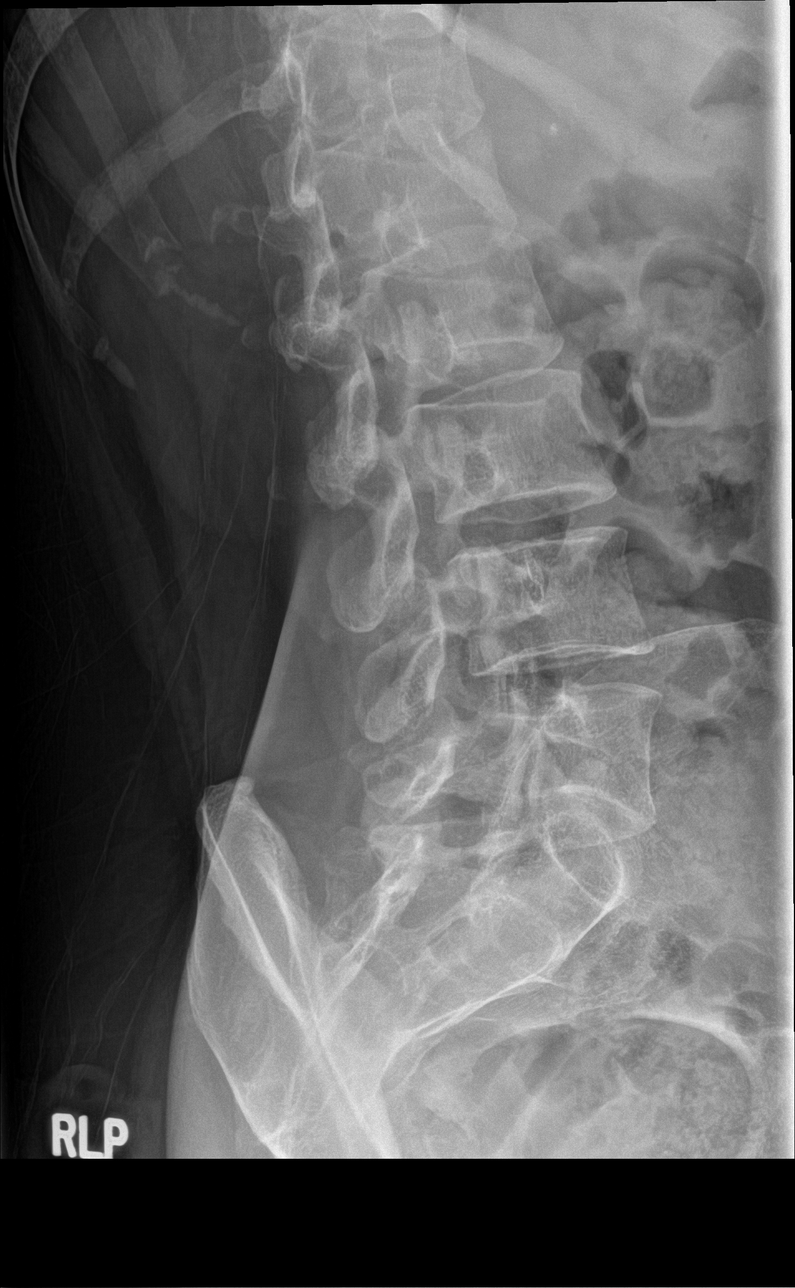

[l-spine lat]
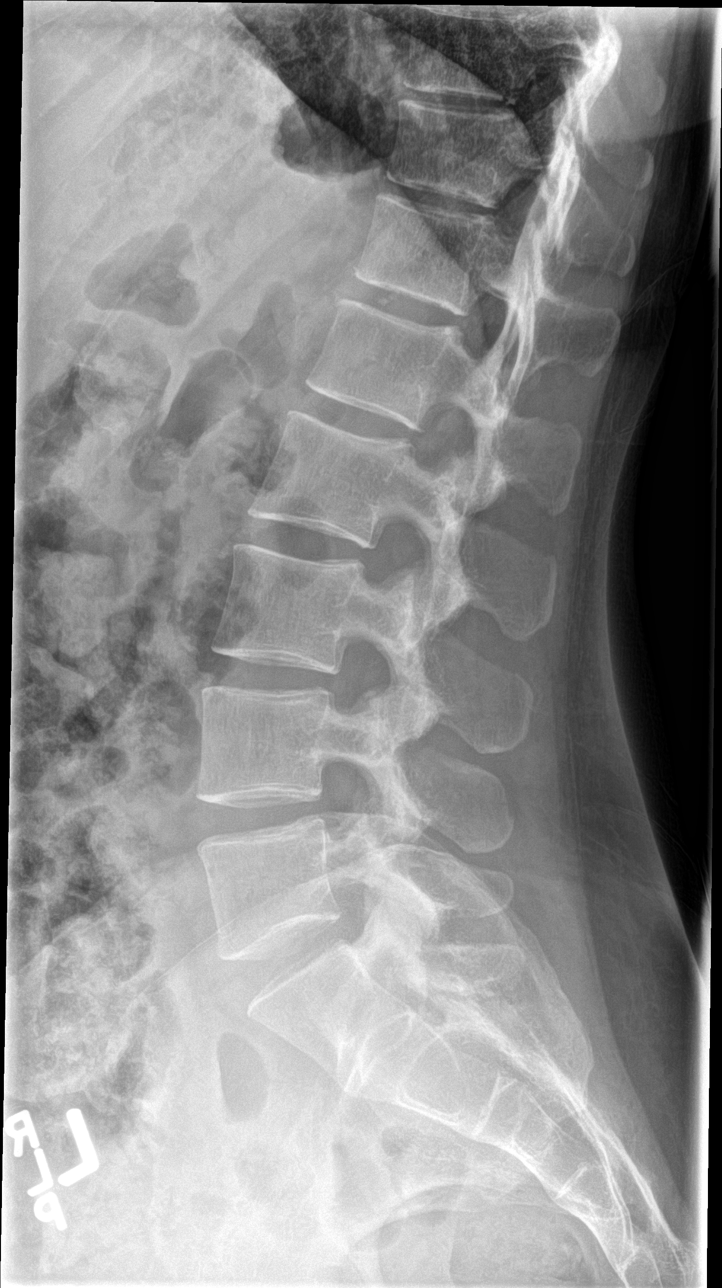

[l-spine spot]
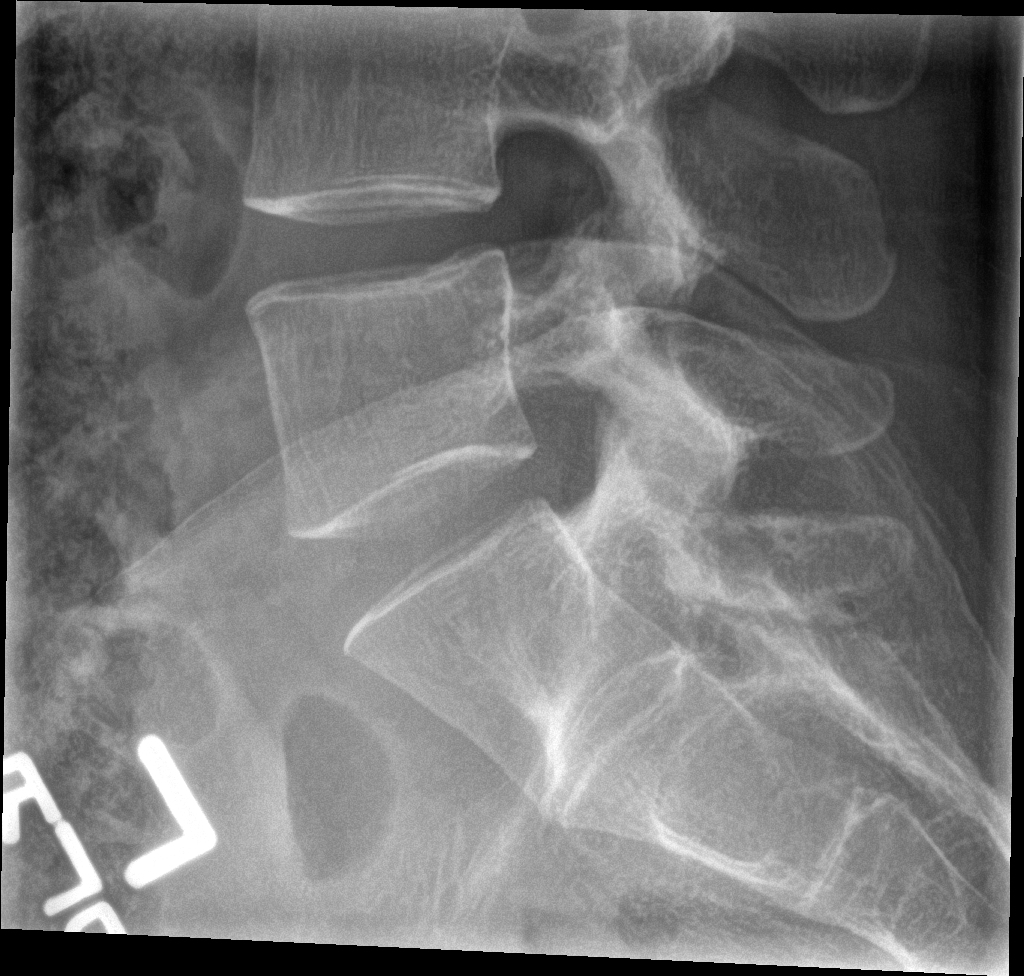

[5 of 5 positions shown; findings below may reference images not displayed]

FINDINGS: There is no evidence of lumbar spine fracture. Alignment is normal.
Intervertebral disc spaces are maintained. No facet arthritis.
IMPRESSION: Normal lumbar spine.

## 2016-12-07 IMAGING — CR DG SI JOINTS 3+V
3 series · 3 of 3 positions shown · non-contrast
Comparison: None.

CLINICAL DATA: Low back pain and pelvic pain and sacroiliac joint
pain.

EXAM:
BILATERAL SACROILIAC JOINTS - 3+ VIEW

[si joints ap]
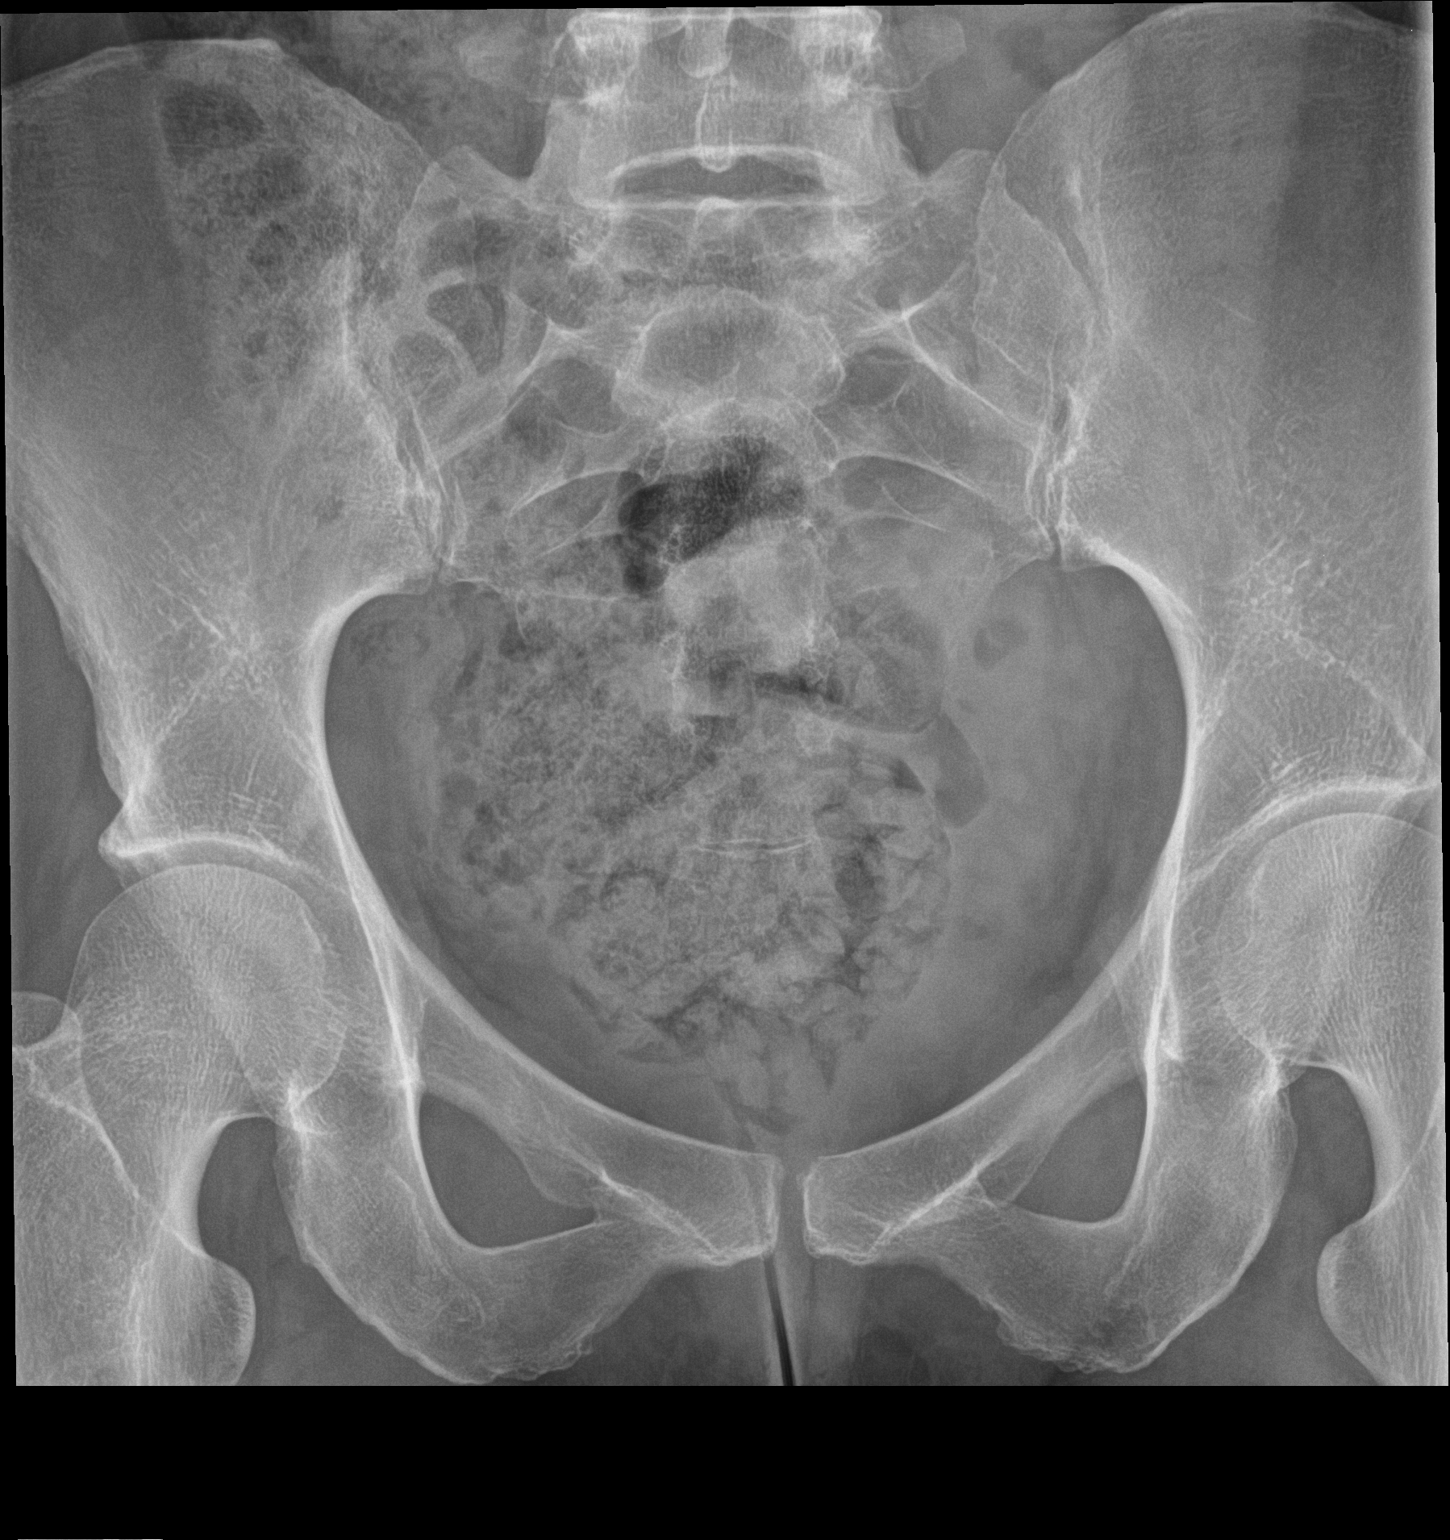

[si joints obl (1 of 2)]
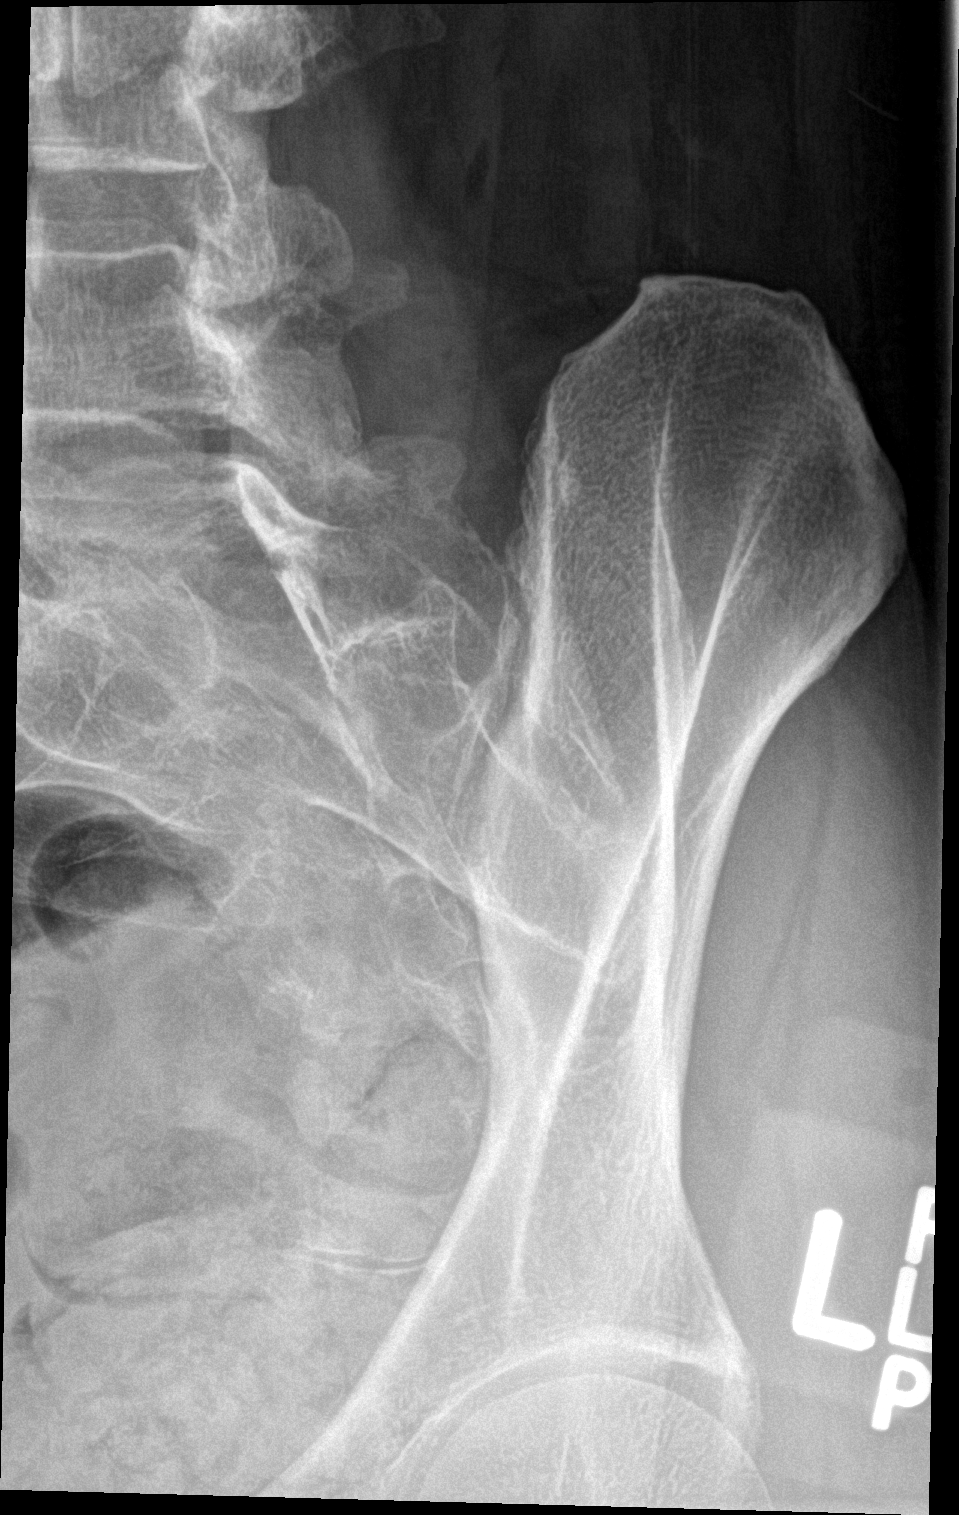

[si joints obl (2 of 2)]
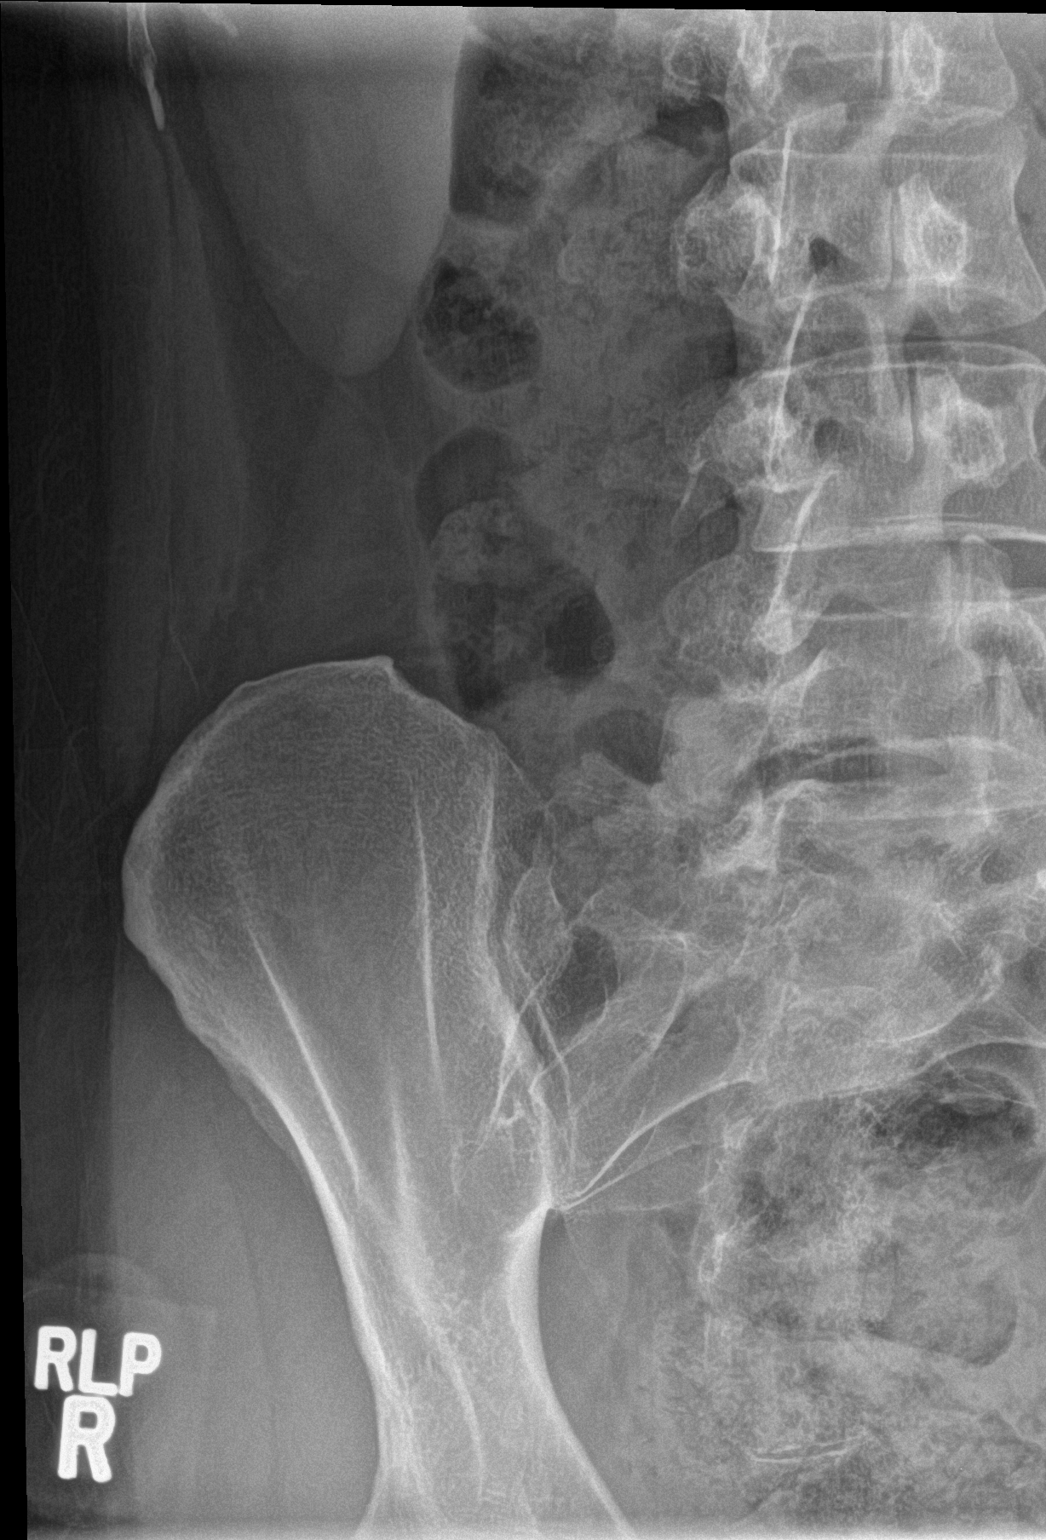

[3 of 3 positions shown; findings below may reference images not displayed]

FINDINGS: The sacroiliac joint spaces are maintained and there is no evidence
of arthropathy. No other bone abnormalities are seen.
IMPRESSION: Normal sacroiliac joints.

## 2016-12-31 ENCOUNTER — Ambulatory Visit (INDEPENDENT_AMBULATORY_CARE_PROVIDER_SITE_OTHER): Payer: BLUE CROSS/BLUE SHIELD | Admitting: Family Medicine

## 2016-12-31 ENCOUNTER — Encounter: Payer: Self-pay | Admitting: Family Medicine

## 2016-12-31 VITALS — BP 114/74 | HR 74 | Temp 98.8°F | Ht 61.0 in | Wt 128.0 lb

## 2016-12-31 DIAGNOSIS — N760 Acute vaginitis: Secondary | ICD-10-CM | POA: Diagnosis not present

## 2016-12-31 DIAGNOSIS — B9689 Other specified bacterial agents as the cause of diseases classified elsewhere: Secondary | ICD-10-CM | POA: Diagnosis not present

## 2016-12-31 DIAGNOSIS — R300 Dysuria: Secondary | ICD-10-CM | POA: Diagnosis not present

## 2016-12-31 LAB — UA/M W/RFLX CULTURE, ROUTINE
Bilirubin, UA: NEGATIVE
Glucose, UA: NEGATIVE
KETONES UA: NEGATIVE
Leukocytes, UA: NEGATIVE
NITRITE UA: NEGATIVE
PH UA: 7.5 (ref 5.0–7.5)
Protein, UA: NEGATIVE
SPEC GRAV UA: 1.015 (ref 1.005–1.030)
Urobilinogen, Ur: 0.2 mg/dL (ref 0.2–1.0)

## 2016-12-31 LAB — WET PREP FOR TRICH, YEAST, CLUE
CLUE CELL EXAM: POSITIVE — AB
Trichomonas Exam: NEGATIVE
YEAST EXAM: NEGATIVE

## 2016-12-31 LAB — MICROSCOPIC EXAMINATION
Bacteria, UA: NONE SEEN
RBC, UA: NONE SEEN /hpf (ref 0–?)

## 2016-12-31 MED ORDER — CLINDAMYCIN HCL 300 MG PO CAPS: 300.0000 mg | ORAL_CAPSULE | Freq: Two times a day (BID) | ORAL | 0 refills | Status: DC

## 2016-12-31 NOTE — Patient Instructions (Signed)
Follow up as needed

## 2016-12-31 NOTE — Progress Notes (Signed)
BP 114/74 (BP Location: Right Arm, Patient Position: Sitting, Cuff Size: Normal)   Pulse 74   Temp 98.8 F (37.1 C)   Ht 5\' 1"  (1.549 m)   Wt 128 lb (58.1 kg)   LMP 04/24/2015   SpO2 100%   BMI 24.19 kg/m    Subjective:    Patient ID: Karen Marsh, female    DOB: 1980/08/24, 36 y.o.   MRN: [id number]  HPI: Karen Marsh is a 36 y.o. female  Chief Complaint  Patient presents with  . Abdominal Cramping    x's 3 days.  . Dysuria  . Vaginal Discharge  . Vaginal Odor   Low back and pelvic cramping, dysuria, discharge, and vaginal odor x 3-4 days. Notes she doesn't drink much water. Not taking anything OTC for sxs.  Does have a hx of UTIs and kidney stones.   Relevant past medical, surgical, family and social history reviewed and updated as indicated. Interim medical history since our last visit reviewed. Allergies and medications reviewed and updated.  Review of Systems  Constitutional: Negative.   HENT: Negative.   Respiratory: Negative.   Cardiovascular: Negative.   Gastrointestinal: Negative.   Genitourinary: Positive for dysuria, pelvic pain and vaginal discharge.       Vaginal odor  Musculoskeletal: Positive for back pain.  Neurological: Negative.   Psychiatric/Behavioral: Negative.     Per HPI unless specifically indicated above     Objective:    BP 114/74 (BP Location: Right Arm, Patient Position: Sitting, Cuff Size: Normal)   Pulse 74   Temp 98.8 F (37.1 C)   Ht 5\' 1"  (1.549 m)   Wt 128 lb (58.1 kg)   LMP 04/24/2015   SpO2 100%   BMI 24.19 kg/m   Wt Readings from Last 3 Encounters:  12/31/16 128 lb (58.1 kg)  05/15/16 130 lb (59 kg)  05/03/16 130 lb (59 kg)    Physical Exam  Constitutional: She is oriented to person, place, and time. She appears well-developed and well-nourished.  HENT:  Head: Atraumatic.  Eyes: Pupils are equal, round, and reactive to light. Conjunctivae are normal.  Neck: Normal range of motion. Neck supple.    Cardiovascular: Normal rate and normal heart sounds.   Pulmonary/Chest: Effort normal and breath sounds normal. No respiratory distress.  Abdominal: Soft. Bowel sounds are normal. She exhibits no distension.  Genitourinary: Vaginal discharge (white discharge present) found.  Genitourinary Comments: No lesions or rashes  Musculoskeletal: Normal range of motion. She exhibits no tenderness (No CVA tenderness b/l).  Neurological: She is alert and oriented to person, place, and time.  Skin: Skin is warm and dry.  Psychiatric: She has a normal mood and affect. Her behavior is normal.  Nursing note and vitals reviewed.     Assessment & Plan:   Problem List Items Addressed This Visit    None    Visit Diagnoses    BV (bacterial vaginosis)    -  Primary   Wet prep + for clue cells, will treat with clindamycin, yogurt, probiotics. Vaginal hygiene reviewed, cotton panties, no scented or harsh soaps   Relevant Medications   clindamycin (CLEOCIN) 300 MG capsule   Other Relevant Orders   WET PREP FOR TRICH, YEAST, CLUE (Completed)   Dysuria       U/A neg for UTI. Push fluids, treat BV. F/u if sxs persist. Add probiotic for good balance   Relevant Orders   UA/M w/rflx Culture, Routine (Completed)  Follow up plan: Return if symptoms worsen or fail to improve.

## 2017-02-15 ENCOUNTER — Encounter: Payer: Self-pay | Admitting: Family Medicine

## 2017-02-15 ENCOUNTER — Other Ambulatory Visit: Payer: Self-pay | Admitting: Family Medicine

## 2017-02-15 MED ORDER — CLINDAMYCIN HCL 300 MG PO CAPS: 300.0000 mg | ORAL_CAPSULE | Freq: Two times a day (BID) | ORAL | 0 refills | Status: DC

## 2017-02-16 ENCOUNTER — Other Ambulatory Visit: Payer: Self-pay | Admitting: Family Medicine

## 2017-02-16 MED ORDER — CLINDAMYCIN HCL 300 MG PO CAPS: 300.0000 mg | ORAL_CAPSULE | Freq: Two times a day (BID) | ORAL | 0 refills | Status: DC

## 2017-05-03 ENCOUNTER — Ambulatory Visit: Payer: BLUE CROSS/BLUE SHIELD | Admitting: Urology

## 2017-05-03 ENCOUNTER — Encounter: Payer: Self-pay | Admitting: Urology

## 2017-06-10 DIAGNOSIS — N3001 Acute cystitis with hematuria: Secondary | ICD-10-CM | POA: Insufficient documentation

## 2017-06-10 DIAGNOSIS — M545 Low back pain: Secondary | ICD-10-CM | POA: Insufficient documentation

## 2017-06-10 DIAGNOSIS — Z87891 Personal history of nicotine dependence: Secondary | ICD-10-CM | POA: Insufficient documentation

## 2017-06-10 DIAGNOSIS — Z8673 Personal history of transient ischemic attack (TIA), and cerebral infarction without residual deficits: Secondary | ICD-10-CM | POA: Insufficient documentation

## 2017-06-10 DIAGNOSIS — R300 Dysuria: Secondary | ICD-10-CM | POA: Insufficient documentation

## 2017-06-10 DIAGNOSIS — Z79899 Other long term (current) drug therapy: Secondary | ICD-10-CM | POA: Insufficient documentation

## 2017-06-10 DIAGNOSIS — Z7982 Long term (current) use of aspirin: Secondary | ICD-10-CM | POA: Insufficient documentation

## 2017-06-10 LAB — BASIC METABOLIC PANEL
Anion gap: 11 (ref 5–15)
BUN: 11 mg/dL (ref 6–20)
CHLORIDE: 103 mmol/L (ref 101–111)
CO2: 24 mmol/L (ref 22–32)
Calcium: 9.4 mg/dL (ref 8.9–10.3)
Creatinine, Ser: 0.69 mg/dL (ref 0.44–1.00)
GFR calc non Af Amer: >60 mL/min (ref >60)
Glucose, Bld: 106 mg/dL — ABNORMAL HIGH (ref 65–99)
POTASSIUM: 3.5 mmol/L (ref 3.5–5.1)
Sodium: 138 mmol/L (ref 135–145)

## 2017-06-10 LAB — CBC
HEMATOCRIT: 39.9 % (ref 35.0–47.0)
HEMOGLOBIN: 13.7 g/dL (ref 12.0–16.0)
MCH: 30 pg (ref 26.0–34.0)
MCHC: 34.2 g/dL (ref 32.0–36.0)
MCV: 87.7 fL (ref 80.0–100.0)
PLATELETS: 351 10*3/uL (ref 150–440)
RBC: 4.55 MIL/uL (ref 3.80–5.20)
RDW: 13.6 % (ref 11.5–14.5)
WBC: 11.4 10*3/uL — AB (ref 3.6–11.0)

## 2017-06-10 NOTE — ED Triage Notes (Signed)
Patient c/o lower left back/flank pain radiating to lower left abdomen. Patient reports pain began Saturday and increased in severity today. Patient reports hx of kidney stones. Patient reports pain with urination. Patient reports taking pyridium and motrin for symptoms with no relief.

## 2017-06-11 ENCOUNTER — Emergency Department
Admission: EM | Admit: 2017-06-11 | Discharge: 2017-06-11 | Disposition: A | Discharge status: Home / Self Care | Payer: Self-pay | Attending: Emergency Medicine | Admitting: Emergency Medicine

## 2017-06-11 ENCOUNTER — Emergency Department: Payer: Self-pay

## 2017-06-11 DIAGNOSIS — R109 Unspecified abdominal pain: Secondary | ICD-10-CM

## 2017-06-11 DIAGNOSIS — N3001 Acute cystitis with hematuria: Secondary | ICD-10-CM

## 2017-06-11 LAB — URINALYSIS, COMPLETE (UACMP) WITH MICROSCOPIC
Bilirubin Urine: NEGATIVE
GLUCOSE, UA: NEGATIVE mg/dL
KETONES UR: NEGATIVE mg/dL
Nitrite: POSITIVE — AB
PROTEIN: 30 mg/dL — AB
Specific Gravity, Urine: 1.004 — ABNORMAL LOW (ref 1.005–1.030)
pH: 7 (ref 5.0–8.0)

## 2017-06-11 MED ORDER — ONDANSETRON HCL 4 MG/2ML IJ SOLN
4.0000 mg | Freq: Once | INTRAMUSCULAR | Status: AC
  Administered 2017-06-11: 4 mg via INTRAVENOUS
  Filled 2017-06-11: qty 2

## 2017-06-11 MED ORDER — MORPHINE SULFATE (PF) 4 MG/ML IV SOLN
4.0000 mg | Freq: Once | INTRAVENOUS | Status: AC
  Administered 2017-06-11: 4 mg via INTRAVENOUS
  Filled 2017-06-11: qty 1

## 2017-06-11 MED ORDER — ETODOLAC 200 MG PO CAPS: 200.0000 mg | ORAL_CAPSULE | Freq: Three times a day (TID) | ORAL | 0 refills | Status: DC

## 2017-06-11 MED ORDER — SODIUM CHLORIDE 0.9 % IV SOLN
1.0000 g | Freq: Once | INTRAVENOUS | Status: AC
  Administered 2017-06-11: 1 g via INTRAVENOUS
  Filled 2017-06-11: qty 10

## 2017-06-11 MED ORDER — PHENAZOPYRIDINE HCL 100 MG PO TABS: 100.0000 mg | ORAL_TABLET | Freq: Three times a day (TID) | ORAL | 0 refills | Status: AC | PRN

## 2017-06-11 MED ORDER — SODIUM CHLORIDE 0.9 % IV BOLUS (SEPSIS)
1000.0000 mL | Freq: Once | INTRAVENOUS | Status: AC
  Administered 2017-06-11: 1000 mL via INTRAVENOUS

## 2017-06-11 MED ORDER — KETOROLAC TROMETHAMINE 30 MG/ML IJ SOLN
30.0000 mg | Freq: Once | INTRAMUSCULAR | Status: AC
  Administered 2017-06-11: 30 mg via INTRAVENOUS
  Filled 2017-06-11: qty 1

## 2017-06-11 MED ORDER — CEPHALEXIN 500 MG PO CAPS: 500.0000 mg | ORAL_CAPSULE | Freq: Four times a day (QID) | ORAL | 0 refills | Status: AC

## 2017-06-11 NOTE — Discharge Instructions (Signed)
It appears that she have a urinary tract infection.  Please ensure that you are drinking lots of fluid and taken her antibiotics.  Please follow-up with urology or your primary care physician for further evaluation.  Please return with any fevers, nausea, vomiting or any other concerns.

## 2017-06-11 NOTE — ED Notes (Signed)
Called lab to inquire about urine collection, apparently there were no orders when the urine was sent down earlier.  I "collected" the urine in Epic, and lab will process.

## 2017-06-11 NOTE — ED Provider Notes (Signed)
[facility] Emergency Department Provider Note   ____________________________________________   First MD Initiated Contact with Patient 06/11/17 0250     (approximate)  I have reviewed the triage vital signs and the nursing notes.   HISTORY  Chief Complaint Flank Pain    HPI Karen Marsh is a 37 y.o. female who comes into the hospital today thinking that she may have had a kidney stone.  The patient states that her pain started severely at 850.  She reports that she has had pain since Saturday night.  She reports that she had some pain and pressure with urination and then some left flank pain.  She reports that she contacted a telemedicine physician and was placed on Macrobid.  She started taking the medication as well as some Azo.  The patient states now that she feels like when she urinates she has razor blades and there is some severe burning.  The patient states that since the symptoms were getting worse she decided to come in.  She rates her pain 8 out of 10 in intensity currently.  The patient has passed kidney stones in the past.  She states that she also took some ibuprofen for her pain.  She is here for evaluation.   Past Medical History:  Diagnosis Date  . Anxiety   . Blood dyscrasia    FACTOR V LEIDEN  . Complication of anesthesia   . Dermoid cyst of ovary    L  . Endometriosis   . Factor V Leiden mutation ([place])   . Pneumonia   . PONV (postoperative nausea and vomiting)    AFTER ABLATION  . Pre-syncope 02-2015  . Quadrantanopsia    R inferior   . Seasonal allergies   . Shortness of breath dyspnea   . Stroke ([place])    In [place] HAPPENED IN THE WOMB-PT HAS NO PERIPHERAL VISION BECAUSE OF THIS    Patient Active Problem List   Diagnosis Date Noted  . Attention deficit hyperactivity disorder (ADHD) 05/05/2016  . Severe recurrent major depression without psychotic features ([place]) 05/04/2016    . Seasonal allergies 11/21/2015  . Factor V deficiency (Leiden mutation) ([place]) (Prone to coagulation) 11/01/2014  . History of stroke 04/06/2010    Past Surgical History:  Procedure Laterality Date  . ABDOMINAL HYSTERECTOMY  07/26/2015   left ovary remaining  . BREAST LUMPECTOMY Right 2004   R breast/ Dr Jamal Collin  . CESAREAN SECTION  2002 and 2011  . DILATION AND CURETTAGE OF UTERUS  2000  . ENDOMETRIAL ABLATION  2015  . LAPAROSCOPIC OVARIAN CYSTECTOMY Left 03/01/2015   Procedure: LEFT LAPAROSCOPIC OVARIAN CYSTECTOMY, bilateral tubal cystectomies;  Surgeon: Gae Dry, MD;  Location: [facility] ORS;  Service: Gynecology;  Laterality: Left;  . LAPAROSCOPY N/A 03/01/2015   Procedure: LAPAROSCOPY OPERATIVE;  Surgeon: Gae Dry, MD;  Location: [facility] ORS;  Service: Gynecology;  Laterality: N/A;  . NASAL SINUS SURGERY  2014  . oophrectomy Right 2010  . TONSILLECTOMY  2009  . tummy tuck  2012    Prior to Admission medications   Medication Sig Start Date End Date Taking? Authorizing Provider  albuterol (PROAIR HFA) 108 (90 Base) MCG/ACT inhaler Inhale 2 puffs into the lungs as needed. 11/21/15   Guadalupe Maple, MD  amphetamine-dextroamphetamine (ADDERALL XR) 30 MG 24 hr capsule 30 mg daily.  01/31/15   [provider]  aspirin 81 MG EC tablet Take 81 mg  by mouth daily.      [provider]  cephALEXin (KEFLEX) 500 MG capsule Take 1 capsule (500 mg total) by mouth 4 (four) times daily for 10 days. 06/11/17 06/21/17  Loney Hering, MD  cholecalciferol (VITAMIN D) 1000 units tablet Take 1,000 Units by mouth daily.    [provider]  clindamycin (CLEOCIN) 300 MG capsule Take 1 capsule (300 mg total) 2 (two) times daily by mouth. 02/16/17   Crissman, Jeannette How, MD  DULoxetine (CYMBALTA) 30 MG capsule Take 3 capsules (90 mg total) by mouth daily. 05/07/16   Hildred Priest, MD  etodolac (LODINE) 200 MG capsule Take 1 capsule (200 mg total) by mouth every 8  (eight) hours. 06/11/17   Loney Hering, MD  loratadine (CLARITIN) 10 MG tablet Take 10 mg by mouth daily.    [provider]  LORazepam (ATIVAN) 0.5 MG tablet Take 1 tablet (0.5 mg total) by mouth 2 (two) times daily as needed for anxiety. 05/06/16   Hildred Priest, MD  phenazopyridine (PYRIDIUM) 100 MG tablet Take 1 tablet (100 mg total) by mouth 3 (three) times daily as needed for pain. 06/11/17 06/11/18  Loney Hering, MD  polyethylene glycol powder (GLYCOLAX/MIRALAX) powder Take 17 g by mouth daily. 05/15/16   Harvest Dark, MD    Allergies Azithromycin and Codeine  Family History  Problem Relation Age of Onset  . Cancer Maternal Grandfather        prostate  . Diabetes Maternal Grandfather   . Parkinson's disease Father   . Diabetes Maternal Grandmother   . Cancer Maternal Grandmother        throat    Social History Social History   Tobacco Use  . Smoking status: Former Smoker    Packs/day: 0.25    Years: 2.00    Pack years: 0.50    Types: Cigarettes    Last attempt to quit: 02/24/2002    Years since quitting: 15.3  . Smokeless tobacco: Never Used  . Tobacco comment: quit in 2007   Substance Use Topics  . Alcohol use: Yes    Comment: 1 glass of wine/week   . Drug use: No    Review of Systems  Constitutional: No fever/chills Eyes: No visual changes. ENT: No sore throat. Cardiovascular: Denies chest pain. Respiratory: Denies shortness of breath. Gastrointestinal: No abdominal pain.  No nausea, no vomiting.  No diarrhea.  No constipation. Genitourinary: dysuria. Musculoskeletal: Left flank pain Skin: Negative for rash. Neurological: Negative for headaches, focal weakness or numbness.   ____________________________________________   PHYSICAL EXAM:  VITAL SIGNS: ED Triage Vitals  Enc Vitals Group     BP 06/10/17 2242 (!) 128/91     Pulse Rate 06/10/17 2242 91     Resp 06/10/17 2242 16     Temp 06/10/17 2242 98.1 F (36.7 C)       Temp Source 06/10/17 2242 Oral     SpO2 06/10/17 2242 100 %     Weight 06/10/17 2243 125 lb (56.7 kg)     Height 06/10/17 2243 5\' 1"  (1.549 m)     Head Circumference --      Peak Flow --      Pain Score 06/10/17 2257 5     Pain Loc --      Pain Edu? --      Excl. in [place]? --     Constitutional: Alert and oriented. Well appearing and in moderate distress. Eyes: Conjunctivae are normal. PERRL. EOMI. Head: Atraumatic.  Nose: No congestion/rhinnorhea. Mouth/Throat: Mucous membranes are moist.  Oropharynx non-erythematous. Cardiovascular: Normal rate, regular rhythm. Grossly normal heart sounds.  Good peripheral circulation. Respiratory: Normal respiratory effort.  No retractions. Lungs CTAB. Gastrointestinal: Soft and nontender. No distention.  Positive bowel sounds, mild left CVA tenderness to palpation Musculoskeletal: No lower extremity tenderness nor edema.   Neurologic:  Normal speech and language.  Skin:  Skin is warm, dry and intact.  Psychiatric: Mood and affect are normal.   ____________________________________________   LABS (all labs ordered are listed, but only abnormal results are displayed)  Labs Reviewed  URINALYSIS, COMPLETE (UACMP) WITH MICROSCOPIC - Abnormal; Notable for the following components:      Result Value   Color, Urine AMBER (*)    APPearance CLEAR (*)    Specific Gravity, Urine 1.004 (*)    Hgb urine dipstick MODERATE (*)    Protein, ur 30 (*)    Nitrite POSITIVE (*)    Leukocytes, UA MODERATE (*)    Bacteria, UA RARE (*)    Squamous Epithelial / LPF 0-5 (*)    All other components within normal limits  BASIC METABOLIC PANEL - Abnormal; Notable for the following components:   Glucose, Bld 106 (*)    All other components within normal limits  CBC - Abnormal; Notable for the following components:   WBC 11.4 (*)    All other components within normal limits  URINE CULTURE    ____________________________________________  EKG  none ____________________________________________  RADIOLOGY  ED MD interpretation: CT renal stone study: Bilateral nonobstructive kidney stones, no obstructive stones.  Official radiology report(s): Ct Renal Stone Study  Result Date: 06/11/2017 CLINICAL DATA:  Low back pain and flank pain. EXAM: CT ABDOMEN AND PELVIS WITHOUT CONTRAST TECHNIQUE: Multidetector CT imaging of the abdomen and pelvis was performed following the standard protocol without IV contrast. COMPARISON:  CT AP 05/15/16 FINDINGS: Lower chest: No basilar pulmonary nodules or pleural effusion. No apical pericardial effusion. Hepatobiliary: Normal hepatic contours and density. No visible biliary dilatation. Normal gallbladder. Pancreas: Normal parenchymal contours without ductal dilatation. No peripancreatic fluid collection. Spleen: Normal. Adrenals/Urinary Tract: --Adrenal glands: Normal. --Right kidney/ureter: 2 mm nonobstructive upper pole stone. No hydronephrosis. --Left kidney/ureter: 3 mm interpolar nonobstructive stone. No hydronephrosis. --Urinary bladder: Normal appearance for the degree of distention. Stomach/Bowel: --Stomach/Duodenum: No hiatal hernia or other gastric abnormality. Normal duodenal course. --Small bowel: No dilatation or inflammation. --Colon: No focal abnormality. --Appendix: Normal. Vascular/Lymphatic: Normal course and caliber of the major abdominal vessels. No abdominal or pelvic lymphadenopathy. Reproductive: 3.5 cm left ovarian cyst. Status post right oophorectomy and hysterectomy. Musculoskeletal. No bony spinal canal stenosis or focal osseous abnormality. Other: None. IMPRESSION: 1. Bilateral non-obstructive nephrolithiasis. 2. No acute abdominal or pelvic abnormality. Electronically Signed   By: Ulyses Jarred M.D.   On: 06/11/2017 04:24    ____________________________________________   PROCEDURES  Procedure(s) performed:  None  Procedures  Critical Care performed: No  ____________________________________________   INITIAL IMPRESSION / ASSESSMENT AND PLAN / ED COURSE  As part of my medical decision making, I reviewed the following data within the electronic MEDICAL RECORD NUMBER Notes from prior ED visits and  Controlled Substance Database   This is a 37 year old female who comes into the hospital today with some left flank pain as well as burning with urination.  My differential diagnosis includes kidney stone, UTI.  We did check some blood work on the patient did show a white blood cell count of 11.4 as well as too numerous  to count white blood cells with positive nitrites and moderate leukocytes in her urine.  I did send the patient for a CT scan looking for possible kidney stones but the patient does not have any obstructive stones.  I gave the patient a dose of ceftriaxone as well as a liter of normal saline.  She also received morphine and Toradol.  She will be discharged home to follow-up with urology.  The patient has no further concerns or complaints at this time.      ____________________________________________   FINAL CLINICAL IMPRESSION(S) / ED DIAGNOSES  Final diagnoses:  Acute cystitis with hematuria  Flank pain     ED Discharge Orders        Ordered    cephALEXin (KEFLEX) 500 MG capsule  4 times daily     06/11/17 0435    phenazopyridine (PYRIDIUM) 100 MG tablet  3 times daily PRN     06/11/17 0435    etodolac (LODINE) 200 MG capsule  Every 8 hours     06/11/17 0435       Note:  This document was prepared using Dragon voice recognition software and may include unintentional dictation errors.    Loney Hering, MD 06/11/17 [phone]

## 2017-06-12 LAB — URINE CULTURE: CULTURE: NO GROWTH

## 2017-11-07 IMAGING — US US ABDOMEN LIMITED
1 series · 14 of 25 positions shown · non-contrast
Comparison: None.

CLINICAL DATA: Right upper quadrant pain for several hours

EXAM:
US ABDOMEN LIMITED - RIGHT UPPER QUADRANT

[Series 1: us abdomen limited · 0.21mm/px · 14 of 78 slices shown]
[im 1/78]
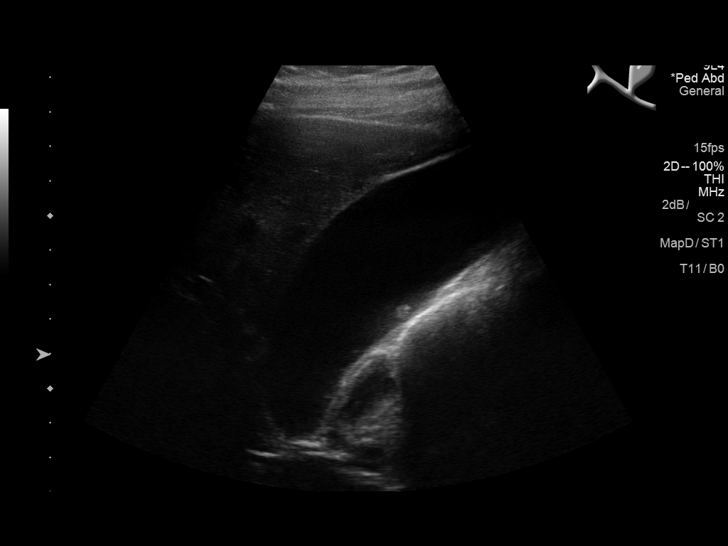
[im 7/78]
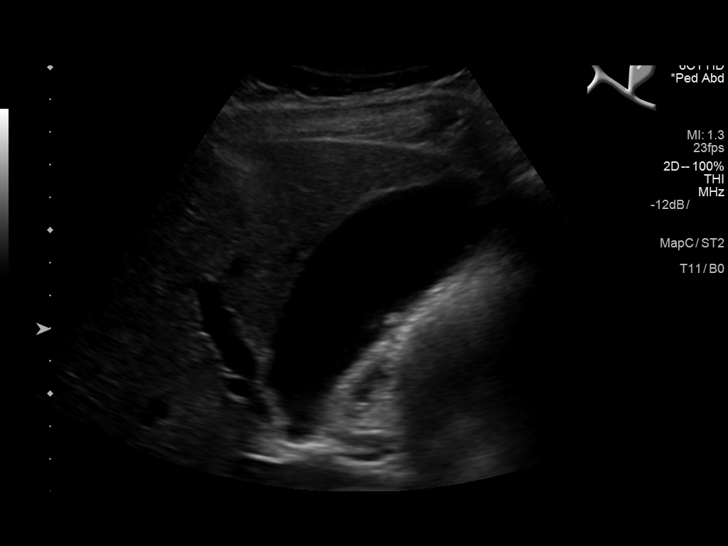
[im 13/78]
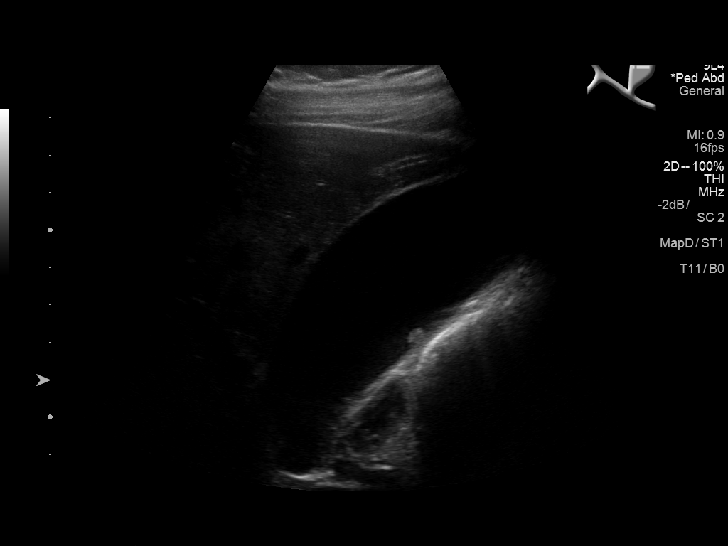
[im 20/78]
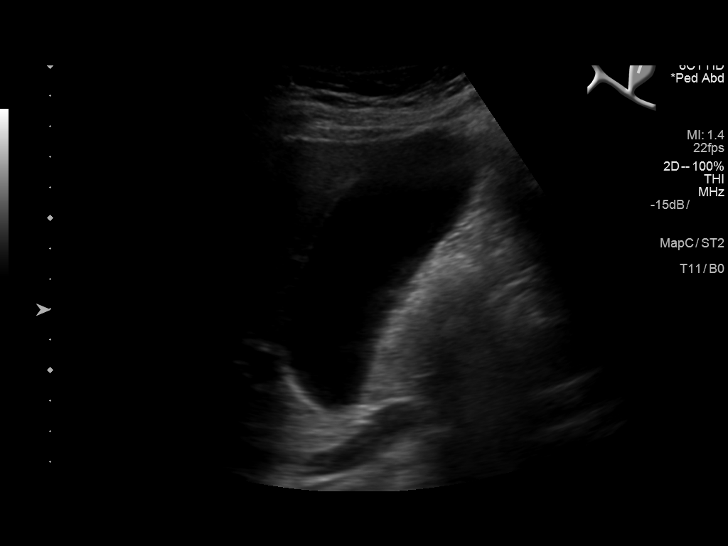
[im 26/78]
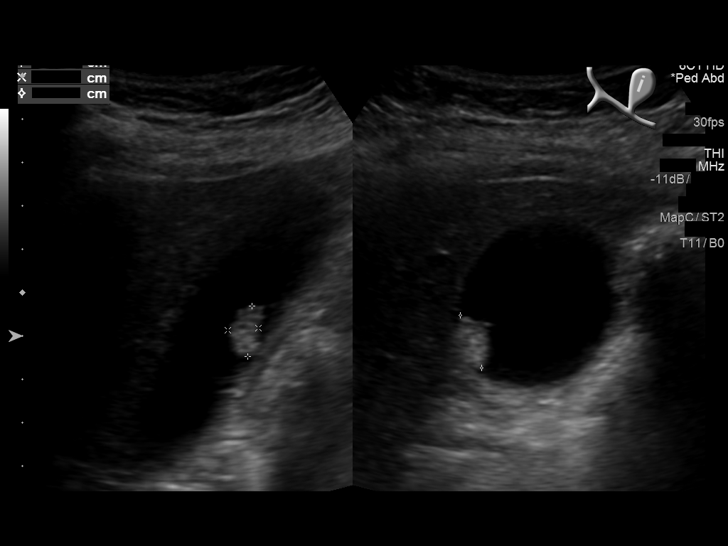
[im 29/78]
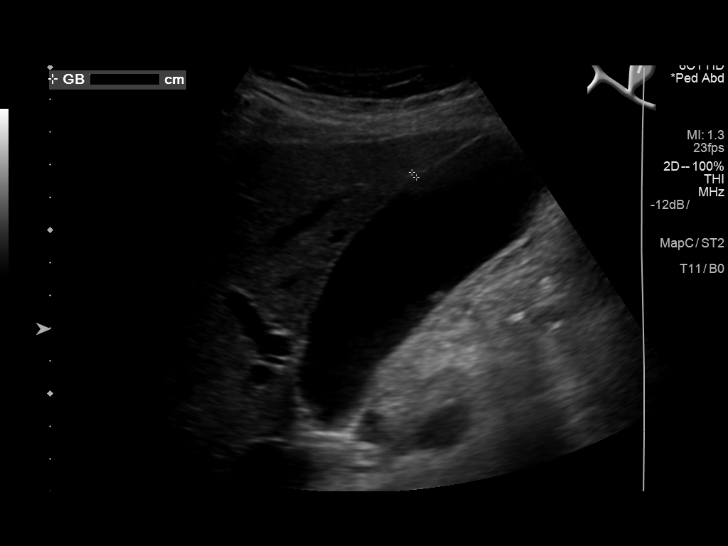
[im 36/78]
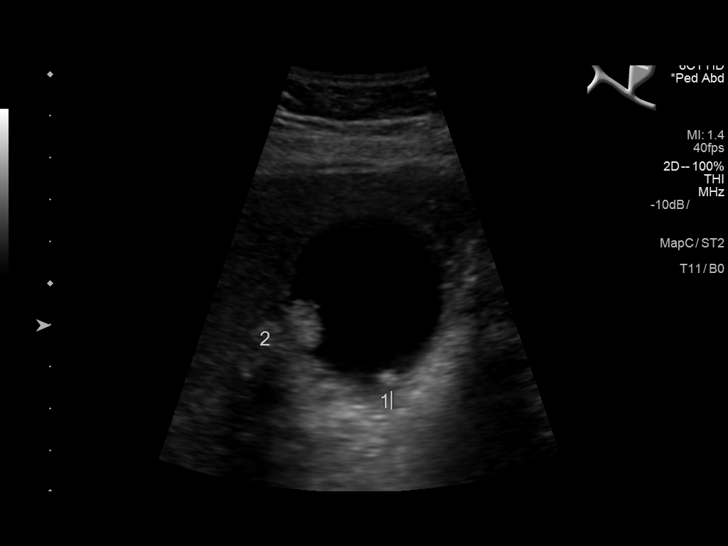
[im 42/78]
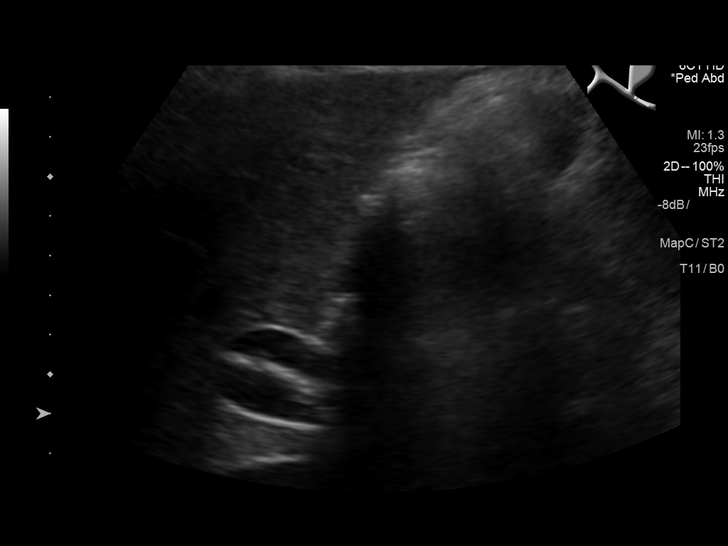
[im 49/78]
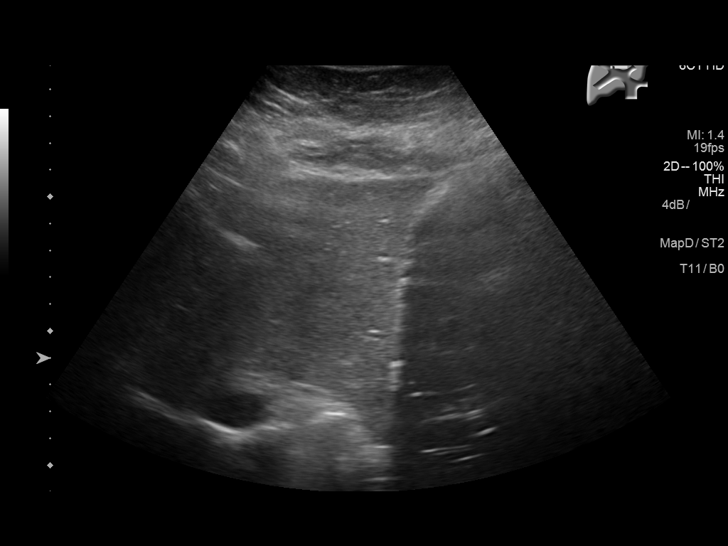
[im 52/78]
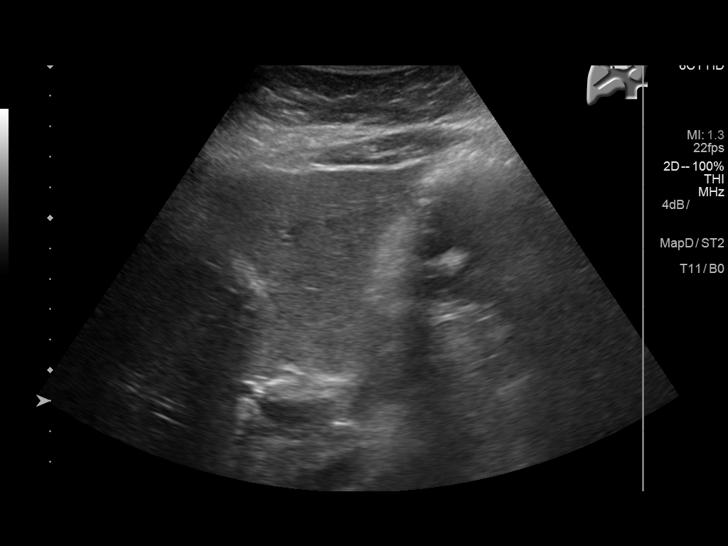
[im 58/78]
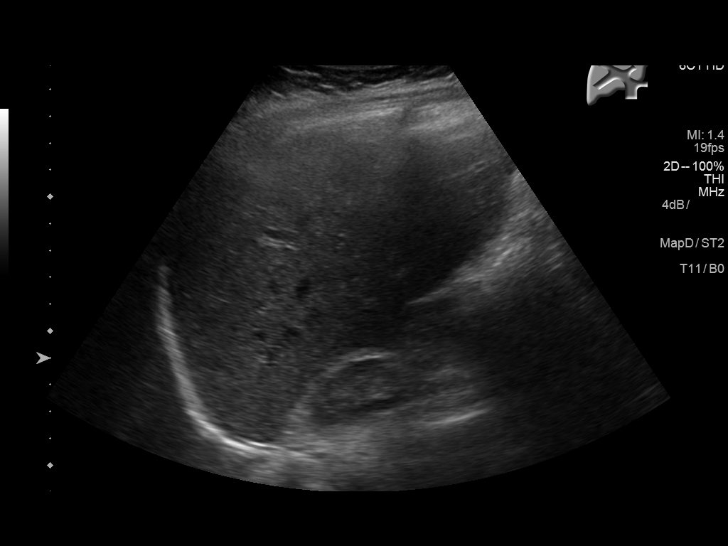
[im 65/78]
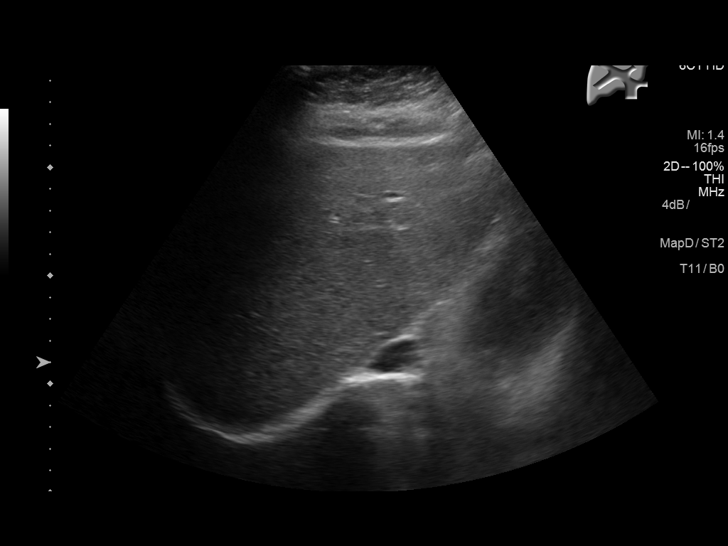
[im 71/78]
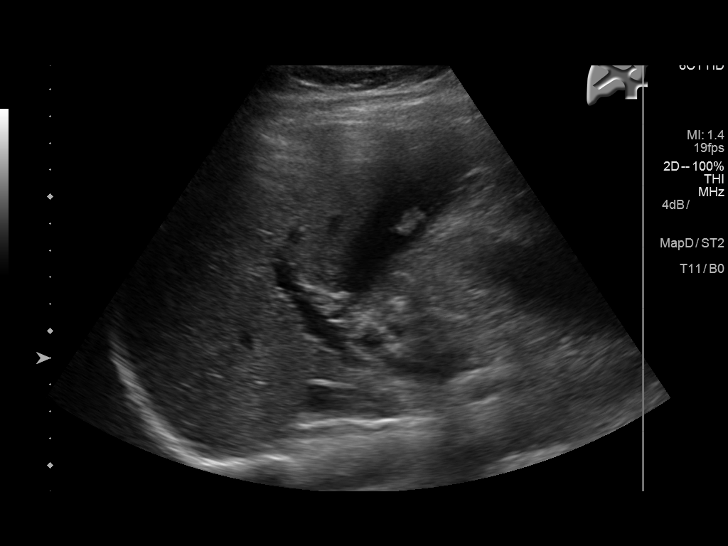
[im 78/78]
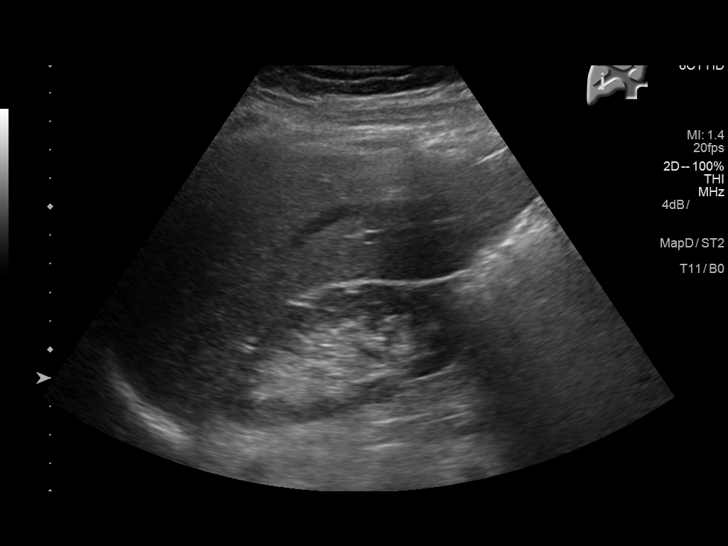

[14 of 25 positions shown; findings below may reference images not displayed]

FINDINGS: Gallbladder:

Gallbladder is well distended. Multiple echogenic foci are
identified. Some of these superior adherent and are likely
gallbladder polyps. Some tumefactive sludge is likely present as
well. No shadowing gallstones are seen.

Common bile duct:

Diameter: 6.9 mm. This is mildly dilated but not significantly
different than that seen on recent CT examination.

Liver:

No focal lesion identified. Within normal limits in parenchymal
echogenicity.
IMPRESSION: Echogenic foci are noted within the gallbladder likely representing
polyps. Some tumefactive sludge may be present as well. No
definitive calculus is seen. No pericholecystic fluid is noted.

Mild dilatation of the common bile duct similar to that seen on the
recent CT.

## 2018-07-09 ENCOUNTER — Ambulatory Visit: Payer: Self-pay | Admitting: *Deleted

## 2018-07-09 NOTE — Telephone Encounter (Signed)
Message from Beverley Fiedler sent at 07/09/2018 1:14 PM EDT   Summary: Clinical Advice   Patient stated she has a productive cough, itchy eyes x3 , no fever, no travel. Patient seeking OTC recommendations. Please advise         Pt calling thinking she may have bronchitis. She denies fever, has cough has been productive but not now. Has a raspy cough. Did have a headache a couple days ago. Has not traveled out of [place] in the last month. Requesting an appointment . Advised that the provider is doing virtual visits, pt voiced understanding. Pt's e mail address is  [email or web address] Routing to flow at [facility].  Reason for Disposition . Cough  Answer Assessment - Initial Assessment Questions 1. ONSET: "When did the cough begin?"      This past Saturday 2. SEVERITY: "How bad is the cough today?"      Feels worst 3. RESPIRATORY DISTRESS: "Describe your breathing."      normally 4. FEVER: "Do you have a fever?" If so, ask: "What is your temperature, how was it measured, and when did it start?"     no 5. SPUTUM: "Describe the color of your sputum" (clear, white, yellow, green)     Clear in the last couple of days 6. HEMOPTYSIS: "Are you coughing up any blood?" If so ask: "How much?" (flecks, streaks, tablespoons, etc.)     no 7. CARDIAC HISTORY: "Do you have any history of heart disease?" (e.g., heart attack, congestive heart failure)      no 8. LUNG HISTORY: "Do you have any history of lung disease?"  (e.g., pulmonary embolus, asthma, emphysema)     Treated with asthma 9. PE RISK FACTORS: "Do you have a history of blood clots?" (or: recent major surgery, recent prolonged travel, bedridden)     no 10. OTHER SYMPTOMS: "Do you have any other symptoms?" (e.g., runny nose, wheezing, chest pain)       no 11. PREGNANCY: "Is there any chance you are pregnant?" "When was your last menstrual period?"       Not pregnant. Hysterectomy 3 years 37. TRAVEL: "Have you  traveled out of the country in the last month?" (e.g., travel history, exposures)       Have not traveled.  Protocols used: COUGH - ACUTE PRODUCTIVE-A-AH

## 2018-07-09 NOTE — Telephone Encounter (Signed)
Spoke with the patient and advised her to go to UC since she is having URI symptoms.  Just FYI

## 2019-02-23 ENCOUNTER — Other Ambulatory Visit: Payer: Self-pay

## 2019-02-23 ENCOUNTER — Encounter: Payer: Self-pay | Admitting: Family Medicine

## 2019-02-23 ENCOUNTER — Ambulatory Visit (INDEPENDENT_AMBULATORY_CARE_PROVIDER_SITE_OTHER): Payer: Self-pay | Admitting: Family Medicine

## 2019-02-23 VITALS — BP 126/87 | HR 72 | Temp 98.0°F | Ht 62.1 in | Wt 143.4 lb

## 2019-02-23 DIAGNOSIS — F900 Attention-deficit hyperactivity disorder, predominantly inattentive type: Secondary | ICD-10-CM

## 2019-02-23 DIAGNOSIS — F332 Major depressive disorder, recurrent severe without psychotic features: Secondary | ICD-10-CM

## 2019-02-23 DIAGNOSIS — N898 Other specified noninflammatory disorders of vagina: Secondary | ICD-10-CM

## 2019-02-23 DIAGNOSIS — R635 Abnormal weight gain: Secondary | ICD-10-CM

## 2019-02-23 MED ORDER — DULOXETINE HCL 20 MG PO CPEP: 20.0000 mg | ORAL_CAPSULE | Freq: Every day | ORAL | 0 refills | Status: DC

## 2019-02-23 MED ORDER — AMPHETAMINE-DEXTROAMPHET ER 10 MG PO CP24: 10.0000 mg | ORAL_CAPSULE | Freq: Every day | ORAL | 0 refills | Status: DC

## 2019-02-23 NOTE — Progress Notes (Deleted)
BP 126/87   Pulse 72   Temp 98 F (36.7 C) (Oral)   Ht 5' 2.1" (1.577 m)   Wt 143 lb 6.4 oz (65 kg)   LMP 04/24/2015   SpO2 100%   BMI 26.14 kg/m    Subjective:    Patient ID: Karen Marsh, female    DOB: December 29, 1980, 38 y.o.   MRN: [id number]  HPI: JAEONNA VANDERSTELT is a 38 y.o. female presenting on 02/23/2019 for comprehensive medical examination. Current medical complaints include:{Blank single:19197::"none","***"}  Lethargic, low moods  Went off her meds about 3 years ago when she lost insurance. The last 6 months notes significant irritability. Was with Dr. Maryruth Bun at that point. Was on cymbalta, adderall  She currently lives with: Menopausal Symptoms: {Blank single:19197::"yes","no"}  Depression Screen done today and results listed below:  Depression screen [facility] 2/9 05/19/2015 05/09/2015 04/06/2015  Decreased Interest 0 0 0  Down, Depressed, Hopeless 0 0 0  PHQ - 2 Score 0 0 0    The patient {has/does not have:19849} a history of falls. I {did/did not:19850} complete a risk assessment for falls. A plan of care for falls {was/was not:19852} documented.   Past Medical History:  Past Medical History:  Diagnosis Date  . Anxiety   . Blood dyscrasia    FACTOR V LEIDEN  . Complication of anesthesia   . Dermoid cyst of ovary    L  . Endometriosis   . Factor V Leiden mutation (HCC)   . Pneumonia   . PONV (postoperative nausea and vomiting)    AFTER ABLATION  . Pre-syncope 02-2015  . Quadrantanopsia    R inferior   . Seasonal allergies   . Shortness of breath dyspnea   . Stroke (HCC)    In past-UNSURE WHEN IT HAPPENED-PT WAS TOLD IT COULD HAVE HAPPENED IN THE WOMB-PT HAS NO PERIPHERAL VISION BECAUSE OF THIS    Surgical History:  Past Surgical History:  Procedure Laterality Date  . ABDOMINAL HYSTERECTOMY  07/26/2015   left ovary remaining  . BREAST LUMPECTOMY Right 2004   R breast/ Dr Evette Cristal  . CESAREAN SECTION  2002 and 2011  . DILATION AND CURETTAGE OF UTERUS   2000  . ENDOMETRIAL ABLATION  2015  . LAPAROSCOPIC OVARIAN CYSTECTOMY Left 03/01/2015   Procedure: LEFT LAPAROSCOPIC OVARIAN CYSTECTOMY, bilateral tubal cystectomies;  Surgeon: Nadara Mustard, MD;  Location: [facility] ORS;  Service: Gynecology;  Laterality: Left;  . LAPAROSCOPY N/A 03/01/2015   Procedure: LAPAROSCOPY OPERATIVE;  Surgeon: Nadara Mustard, MD;  Location: [facility] ORS;  Service: Gynecology;  Laterality: N/A;  . NASAL SINUS SURGERY  2014  . oophrectomy Right 2010  . TONSILLECTOMY  2009  . tummy tuck  2012    Medications:  Current Outpatient Medications on File Prior to Visit  Medication Sig  . aspirin 81 MG EC tablet Take 81 mg by mouth daily.    Marland Kitchen loratadine (CLARITIN) 10 MG tablet Take 10 mg by mouth daily.  . polyethylene glycol powder (GLYCOLAX/MIRALAX) powder Take 17 g by mouth daily.  Marland Kitchen albuterol (PROAIR HFA) 108 (90 Base) MCG/ACT inhaler Inhale 2 puffs into the lungs as needed. (Patient not taking: Reported on 02/23/2019)  . amphetamine-dextroamphetamine (ADDERALL XR) 30 MG 24 hr capsule 30 mg daily.   . DULoxetine (CYMBALTA) 30 MG capsule Take 3 capsules (90 mg total) by mouth daily. (Patient not taking: Reported on 02/23/2019)  . etodolac (LODINE) 200 MG capsule Take 1 capsule (200 mg total) by mouth every  8 (eight) hours. (Patient not taking: Reported on 02/23/2019)  . LORazepam (ATIVAN) 0.5 MG tablet Take 1 tablet (0.5 mg total) by mouth 2 (two) times daily as needed for anxiety. (Patient not taking: Reported on 02/23/2019)   No current facility-administered medications on file prior to visit.     Allergies:  Allergies  Allergen Reactions  . Azithromycin Nausea And Vomiting  . Codeine     Social History:  Social History   Socioeconomic History  . Marital status: Divorced    Spouse name: Not on file  . Number of children: Not on file  . Years of education: Not on file  . Highest education level: Not on file  Occupational History  . Not on file  Social Needs   . Financial resource strain: Not on file  . Food insecurity    Worry: Not on file    Inability: Not on file  . Transportation needs    Medical: Not on file    Non-medical: Not on file  Tobacco Use  . Smoking status: Former Smoker    Packs/day: 0.25    Years: 2.00    Pack years: 0.50    Types: Cigarettes    Quit date: 02/24/2002    Years since quitting: 17.0  . Smokeless tobacco: Never Used  . Tobacco comment: quit in 2007   Substance and Sexual Activity  . Alcohol use: Yes    Comment: 1 glass of wine/week   . Drug use: No  . Sexual activity: Yes    Birth control/protection: Surgical  Lifestyle  . Physical activity    Days per week: Not on file    Minutes per session: Not on file  . Stress: Not on file  Relationships  . Social Musician on phone: Not on file    Gets together: Not on file    Attends religious service: Not on file    Active member of club or organization: Not on file    Attends meetings of clubs or organizations: Not on file    Relationship status: Not on file  . Intimate partner violence    Fear of current or ex partner: Not on file    Emotionally abused: Not on file    Physically abused: Not on file    Forced sexual activity: Not on file  Other Topics Concern  . Not on file  Social History Narrative   Married; full time; does not get regular exercise.    Social History   Tobacco Use  Smoking Status Former Smoker  . Packs/day: 0.25  . Years: 2.00  . Pack years: 0.50  . Types: Cigarettes  . Quit date: 02/24/2002  . Years since quitting: 17.0  Smokeless Tobacco Never Used  Tobacco Comment   quit in 2007    Social History   Substance and Sexual Activity  Alcohol Use Yes   Comment: 1 glass of wine/week     Family History:  Family History  Problem Relation Age of Onset  . Cancer Maternal Grandfather        prostate  . Diabetes Maternal Grandfather   . Parkinson's disease Father   . Diabetes Maternal Grandmother   .  Cancer Maternal Grandmother        throat    Past medical history, surgical history, medications, allergies, family history and social history reviewed with patient today and changes made to appropriate areas of the chart.   Review of Systems - {ros master:310782} All other  ROS negative except what is listed above and in the HPI.      Objective:    BP 126/87   Pulse 72   Temp 98 F (36.7 C) (Oral)   Ht 5' 2.1" (1.577 m)   Wt 143 lb 6.4 oz (65 kg)   LMP 04/24/2015   SpO2 100%   BMI 26.14 kg/m   Wt Readings from Last 3 Encounters:  02/23/19 143 lb 6.4 oz (65 kg)  06/10/17 125 lb (56.7 kg)  12/31/16 128 lb (58.1 kg)    Physical Exam  Results for orders placed or performed during the hospital encounter of 06/11/17  Urine Culture   Specimen: Urine, Random  Result Value Ref Range   Specimen Description      URINE, RANDOM Performed at [facility], [address], [place], [place] [address]    Special Requests      NONE Performed at [facility], [address], [place], [place] [address]    Culture      NO GROWTH Performed at [facility], [address], [place], [place] [address]    Report Status 06/12/2017 FINAL   Urinalysis, Complete w Microscopic  Result Value Ref Range   Color, Urine AMBER (A) YELLOW   APPearance CLEAR (A) CLEAR   Specific Gravity, Urine 1.004 (L) 1.005 - 1.030   pH 7.0 5.0 - 8.0   Glucose, UA NEGATIVE NEGATIVE mg/dL   Hgb urine dipstick MODERATE (A) NEGATIVE   Bilirubin Urine NEGATIVE NEGATIVE   Ketones, ur NEGATIVE NEGATIVE mg/dL   Protein, ur 30 (A) NEGATIVE mg/dL   Nitrite POSITIVE (A) NEGATIVE   Leukocytes, UA MODERATE (A) NEGATIVE   RBC / HPF 6-30 0 - 5 RBC/hpf   WBC, UA TOO NUMEROUS TO COUNT 0 - 5 WBC/hpf   Bacteria, UA RARE (A) NONE SEEN   Squamous Epithelial / LPF 0-5 (A) NONE SEEN   Mucus PRESENT   Basic metabolic panel  Result Value Ref Range   Sodium 138 135 - 145 mmol/L   Potassium 3.5 3.5 - 5.1  mmol/L   Chloride 103 101 - 111 mmol/L   CO2 24 22 - 32 mmol/L   Glucose, Bld 106 (H) 65 - 99 mg/dL   BUN 11 6 - 20 mg/dL   Creatinine, Ser 6.44 0.44 - 1.00 mg/dL   Calcium 9.4 8.9 - 03.4 mg/dL   GFR calc non Af Amer >60 >60 mL/min   GFR calc Af Amer >60 >60 mL/min   Anion gap 11 5 - 15  CBC  Result Value Ref Range   WBC 11.4 (H) 3.6 - 11.0 K/uL   RBC 4.55 3.80 - 5.20 MIL/uL   Hemoglobin 13.7 12.0 - 16.0 g/dL   HCT 74.2 59.5 - 63.8 %   MCV 87.7 80.0 - 100.0 fL   MCH 30.0 26.0 - 34.0 pg   MCHC 34.2 32.0 - 36.0 g/dL   RDW 75.6 43.3 - 29.5 %   Platelets 351 150 - 440 K/uL      Assessment & Plan:   Problem List Items Addressed This Visit    None       Follow up plan: No follow-ups on file.   LABORATORY TESTING:  - Pap smear: {Blank single:19197::"pap done","not applicable","up to date","done elsewhere"}  IMMUNIZATIONS:   - Tdap: Tetanus vaccination status reviewed: {tetanus status:315746}. - Influenza: {Blank single:19197::"Up to date","Administered today","Postponed to flu season","Refused","Given elsewhere"} - Pneumovax: {Blank single:19197::"Up to date","Administered today","Not applicable","Refused","Given elsewhere"} - Prevnar: {  Blank single:19197::"Up to date","Administered today","Not applicable","Refused","Given elsewhere"} - HPV: {Blank single:19197::"Up to date","Administered today","Not applicable","Refused","Given elsewhere"} - Zostavax vaccine: {Blank single:19197::"Up to date","Administered today","Not applicable","Refused","Given elsewhere"}  SCREENING: -Mammogram: {Blank single:19197::"Up to date","Ordered today","Not applicable","Refused","Done elsewhere"}  - Colonoscopy: {Blank single:19197::"Up to date","Ordered today","Not applicable","Refused","Done elsewhere"}  - Bone Density: {Blank single:19197::"Up to date","Ordered today","Not applicable","Refused","Done elsewhere"}  -Hearing Test: {Blank single:19197::"Up to date","Ordered today","Not  applicable","Refused","Done elsewhere"}  -Spirometry: {Blank single:19197::"Up to date","Ordered today","Not applicable","Refused","Done elsewhere"}   PATIENT COUNSELING:   Advised to take 1 mg of folate supplement per day if capable of pregnancy.   Sexuality: Discussed sexually transmitted diseases, partner selection, use of condoms, avoidance of unintended pregnancy  and contraceptive alternatives.   Advised to avoid cigarette smoking.  I discussed with the patient that most people either abstain from alcohol or drink within safe limits (<=14/week and <=4 drinks/occasion for males, <=7/weeks and <= 3 drinks/occasion for females) and that the risk for alcohol disorders and other health effects rises proportionally with the number of drinks per week and how often a drinker exceeds daily limits.  Discussed cessation/primary prevention of drug use and availability of treatment for abuse.   Diet: Encouraged to adjust caloric intake to maintain  or achieve ideal body weight, to reduce intake of dietary saturated fat and total fat, to limit sodium intake by avoiding high sodium foods and not adding table salt, and to maintain adequate dietary potassium and calcium preferably from fresh fruits, vegetables, and low-fat dairy products.    stressed the importance of regular exercise  Injury prevention: Discussed safety belts, safety helmets, smoke detector, smoking near bedding or upholstery.   Dental health: Discussed importance of regular tooth brushing, flossing, and dental visits.    NEXT PREVENTATIVE PHYSICAL DUE IN 1 YEAR. No follow-ups on file.

## 2019-02-24 LAB — CBC WITH DIFFERENTIAL/PLATELET
Basophils Absolute: 0.1 10*3/uL (ref 0.0–0.2)
Basos: 1 %
EOS (ABSOLUTE): 0.4 10*3/uL (ref 0.0–0.4)
Eos: 5 %
Hematocrit: 42.6 % (ref 34.0–46.6)
Hemoglobin: 14.5 g/dL (ref 11.1–15.9)
Immature Grans (Abs): 0 10*3/uL (ref 0.0–0.1)
Immature Granulocytes: 0 %
Lymphocytes Absolute: 2.2 10*3/uL (ref 0.7–3.1)
Lymphs: 32 %
MCH: 30.5 pg (ref 26.6–33.0)
MCHC: 34 g/dL (ref 31.5–35.7)
MCV: 90 fL (ref 79–97)
Monocytes Absolute: 0.5 10*3/uL (ref 0.1–0.9)
Monocytes: 7 %
Neutrophils Absolute: 3.8 10*3/uL (ref 1.4–7.0)
Neutrophils: 55 %
Platelets: 396 10*3/uL (ref 150–450)
RBC: 4.76 x10E6/uL (ref 3.77–5.28)
RDW: 12.6 % (ref 11.7–15.4)
WBC: 7 10*3/uL (ref 3.4–10.8)

## 2019-02-24 LAB — TSH: TSH: 1.76 u[IU]/mL (ref 0.450–4.500)

## 2019-02-26 NOTE — Assessment & Plan Note (Signed)
Will restart adderall at lower dose. Monitor for benefit. Risks reviewed

## 2019-02-26 NOTE — Assessment & Plan Note (Signed)
Will restart Cymbalta and titrate up as needed. Declines counseling or Psychiatry referral at this time due to lack of insurance coverage. F/u in 1 month for recheck

## 2019-02-26 NOTE — Progress Notes (Signed)
BP 126/87   Pulse 72   Temp 98 F (36.7 C) (Oral)   Ht 5' 2.1" (1.577 m)   Wt 143 lb 6.4 oz (65 kg)   LMP 04/24/2015   SpO2 100%   BMI 26.14 kg/m    Subjective:    Patient ID: Karen Marsh, female    DOB: 08-Jul-1980, 38 y.o.   MRN: [id number]  HPI: Karen Marsh is a 38 y.o. female  Chief Complaint  Patient presents with  . Annual Exam   Patient initially came in today for CPE but decided to convert visit to OV given lack of insurance coverage.   Main concern today is increasing lethargy, low moods, significant irritability the last 3 months. Went off her meds about 3 years ago when she lost insurance. Was with Dr. Maryruth Bun at that point. Was on cymbalta, prn ativan, and adderall regimen which seemed to do well for her. Diagnosed with ADD and major depression at that time. Denies any SI/HI, self harm, frequent panic attacks.   Also very concerned about her weight gain. Feels she's been eating more due to stress and not exercising as much due to her moods being under poor control. Has not been watching diet at all this year.   Has not had a CPE in several years and requesting a breast exam today. Not having any issues, just wanting reassurance.   Also having a concern about an abnormal feeling area right labia that she noticed a few weeks ago. Does not itch or hurt and has not changed since noticed. Not trying anything for it OTC.   Depression screen [facility] 2/9 02/23/2019 05/19/2015 05/09/2015  Decreased Interest 3 0 0  Down, Depressed, Hopeless 2 0 0  PHQ - 2 Score 5 0 0  Altered sleeping 3 - -  Tired, decreased energy 3 - -  Change in appetite 3 - -  Feeling bad or failure about yourself  1 - -  Trouble concentrating 3 - -  Moving slowly or fidgety/restless 2 - -  Suicidal thoughts 0 - -  PHQ-9 Score 20 - -  Difficult doing work/chores Somewhat difficult - -  No flowsheet data found.    Relevant past medical, surgical, family and social history reviewed and updated as  indicated. Interim medical history since our last visit reviewed. Allergies and medications reviewed and updated.  Review of Systems  Per HPI unless specifically indicated above     Objective:    BP 126/87   Pulse 72   Temp 98 F (36.7 C) (Oral)   Ht 5' 2.1" (1.577 m)   Wt 143 lb 6.4 oz (65 kg)   LMP 04/24/2015   SpO2 100%   BMI 26.14 kg/m   Wt Readings from Last 3 Encounters:  02/23/19 143 lb 6.4 oz (65 kg)  06/10/17 125 lb (56.7 kg)  12/31/16 128 lb (58.1 kg)    Physical Exam Vitals signs and nursing note reviewed. Exam conducted with a chaperone present.  Constitutional:      Appearance: Normal appearance. She is not ill-appearing.  HENT:     Head: Atraumatic.  Eyes:     Extraocular Movements: Extraocular movements intact.     Conjunctiva/sclera: Conjunctivae normal.  Neck:     Musculoskeletal: Normal range of motion and neck supple.  Cardiovascular:     Rate and Rhythm: Normal rate and regular rhythm.     Heart sounds: Normal heart sounds.  Pulmonary:     Effort: Pulmonary effort  is normal.     Breath sounds: Normal breath sounds.  Chest:     Breasts:        Right: No mass, skin change or tenderness.        Left: No mass, skin change or tenderness.  Genitourinary:    General: Normal vulva.     Vagina: No vaginal discharge.     Comments: Small area of thickened tissue right labia, suspect inflammatory changes  Musculoskeletal: Normal range of motion.  Lymphadenopathy:     Upper Body:     Right upper body: No axillary adenopathy.     Left upper body: No axillary adenopathy.  Skin:    General: Skin is warm and dry.  Neurological:     Mental Status: She is alert and oriented to person, place, and time.  Psychiatric:        Mood and Affect: Mood normal.        Thought Content: Thought content normal.        Judgment: Judgment normal.     Results for orders placed or performed in visit on 02/23/19  TSH  Result Value Ref Range   TSH 1.760 0.450 -  4.500 uIU/mL  CBC with Differential/Platelet out  Result Value Ref Range   WBC 7.0 3.4 - 10.8 x10E3/uL   RBC 4.76 3.77 - 5.28 x10E6/uL   Hemoglobin 14.5 11.1 - 15.9 g/dL   Hematocrit 16.1 09.6 - 46.6 %   MCV 90 79 - 97 fL   MCH 30.5 26.6 - 33.0 pg   MCHC 34.0 31.5 - 35.7 g/dL   RDW 04.5 40.9 - 81.1 %   Platelets 396 150 - 450 x10E3/uL   Neutrophils 55 Not Estab. %   Lymphs 32 Not Estab. %   Monocytes 7 Not Estab. %   Eos 5 Not Estab. %   Basos 1 Not Estab. %   Neutrophils Absolute 3.8 1.4 - 7.0 x10E3/uL   Lymphocytes Absolute 2.2 0.7 - 3.1 x10E3/uL   Monocytes Absolute 0.5 0.1 - 0.9 x10E3/uL   EOS (ABSOLUTE) 0.4 0.0 - 0.4 x10E3/uL   Basophils Absolute 0.1 0.0 - 0.2 x10E3/uL   Immature Granulocytes 0 Not Estab. %   Immature Grans (Abs) 0.0 0.0 - 0.1 x10E3/uL      Assessment & Plan:   Problem List Items Addressed This Visit      Other   Severe recurrent major depression without psychotic features (HCC) - Primary    Will restart Cymbalta and titrate up as needed. Declines counseling or Psychiatry referral at this time due to lack of insurance coverage. F/u in 1 month for recheck      Relevant Medications   DULoxetine (CYMBALTA) 20 MG capsule   Attention deficit hyperactivity disorder (ADHD)    Will restart adderall at lower dose. Monitor for benefit. Risks reviewed       Other Visit Diagnoses    Weight gain       Suspect lifestyle related, but will check some basic labs for r/o. Counseling given on lifestyle modifications   Relevant Orders   TSH (Completed)   CBC with Differential/Platelet out (Completed)   Vaginal lesion       Appears benign, but monitor for change       Follow up plan: Return in about 4 weeks (around 03/23/2019) for Mood, ADHD f/u.

## 2019-03-26 ENCOUNTER — Other Ambulatory Visit: Payer: Self-pay

## 2019-03-26 ENCOUNTER — Encounter: Payer: Self-pay | Admitting: Family Medicine

## 2019-03-26 ENCOUNTER — Ambulatory Visit (INDEPENDENT_AMBULATORY_CARE_PROVIDER_SITE_OTHER): Payer: PRIVATE HEALTH INSURANCE | Admitting: Family Medicine

## 2019-03-26 VITALS — Temp 97.3°F | Ht 62.1 in | Wt 134.0 lb

## 2019-03-26 DIAGNOSIS — F332 Major depressive disorder, recurrent severe without psychotic features: Secondary | ICD-10-CM

## 2019-03-26 DIAGNOSIS — F900 Attention-deficit hyperactivity disorder, predominantly inattentive type: Secondary | ICD-10-CM

## 2019-03-26 MED ORDER — DULOXETINE HCL 20 MG PO CPEP: 20.0000 mg | ORAL_CAPSULE | Freq: Every day | ORAL | 0 refills | Status: DC

## 2019-03-26 MED ORDER — AMPHETAMINE-DEXTROAMPHET ER 10 MG PO CP24: 10.0000 mg | ORAL_CAPSULE | Freq: Every day | ORAL | 0 refills | Status: DC | PRN

## 2019-03-26 MED ORDER — AMPHETAMINE-DEXTROAMPHET ER 10 MG PO CP24: 10.0000 mg | ORAL_CAPSULE | Freq: Every day | ORAL | 0 refills | Status: DC

## 2019-03-26 NOTE — Progress Notes (Signed)
Temp (!) 97.3 F (36.3 C) (Oral)   Ht 5' 2.1" (1.577 m)   Wt 134 lb (60.8 kg)   LMP 04/24/2015   BMI 24.43 kg/m    Subjective:    Patient ID: Karen Marsh, female    DOB: [birth year], 38 y.o.   MRN: [id number]  HPI: Karen Marsh is a 38 y.o. female  Chief Complaint  Patient presents with  . Depression  . ADHD    . This visit was completed via WebEx due to the restrictions of the COVID-19 pandemic. All issues as above were discussed and addressed. Physical exam was done as above through visual confirmation on WebEx. If it was felt that the patient should be evaluated in the office, they were directed there. The patient verbally consented to this visit. . Location of the patient: home . Location of the provider: work . Those involved with this call:  . Provider: Roosvelt Maser, PA-C . CMA: Elton Sin, CMA . Front Desk/Registration: Harriet Pho  . Time spent on call: 15 minutes with patient face to face via video conference. More than 50% of this time was spent in counseling and coordination of care. 5 minutes total spent in review of patient's record and preparation of their chart. I verified patient identity using two factors (patient name and date of birth). Patient consents verbally to being seen via telemedicine visit today.   Patient presenting today for f/u moods and ADD since restarting her cymbalta and adderall XR. Overall feeling so much better and like her normal self. Still having some off days here and there but in a good place overall. Denies side effects, sleep or appetite concerns, Si/HI.   Depression screen [facility] 2/9 03/26/2019 02/23/2019 05/19/2015  Decreased Interest 0 3 0  Down, Depressed, Hopeless 1 2 0  PHQ - 2 Score 1 5 0  Altered sleeping 1 3 -  Tired, decreased energy 1 3 -  Change in appetite 1 3 -  Feeling bad or failure about yourself  0 1 -  Trouble concentrating 1 3 -  Moving slowly or fidgety/restless 0 2 -  Suicidal thoughts 0 0 -  PHQ-9  Score 5 20 -  Difficult doing work/chores Somewhat difficult Somewhat difficult -  No flowsheet data found.    Relevant past medical, surgical, family and social history reviewed and updated as indicated. Interim medical history since our last visit reviewed. Allergies and medications reviewed and updated.  Review of Systems  Per HPI unless specifically indicated above     Objective:    Temp (!) 97.3 F (36.3 C) (Oral)   Ht 5' 2.1" (1.577 m)   Wt 134 lb (60.8 kg)   LMP 04/24/2015   BMI 24.43 kg/m   Wt Readings from Last 3 Encounters:  03/26/19 134 lb (60.8 kg)  02/23/19 143 lb 6.4 oz (65 kg)  06/10/17 125 lb (56.7 kg)    Physical Exam Vitals and nursing note reviewed.  Constitutional:      General: She is not in acute distress.    Appearance: Normal appearance.  HENT:     Head: Atraumatic.     Right Ear: External ear normal.     Left Ear: External ear normal.     Nose: Nose normal. No congestion.     Mouth/Throat:     Mouth: Mucous membranes are moist.     Pharynx: Oropharynx is clear. No posterior oropharyngeal erythema.  Eyes:     Extraocular Movements: Extraocular movements intact.  Conjunctiva/sclera: Conjunctivae normal.  Cardiovascular:     Comments: Unable to assess via virtual visit Pulmonary:     Effort: Pulmonary effort is normal. No respiratory distress.  Musculoskeletal:        General: Normal range of motion.     Cervical back: Normal range of motion.  Skin:    General: Skin is dry.     Findings: No erythema.  Neurological:     Mental Status: She is alert and oriented to person, place, and time.  Psychiatric:        Mood and Affect: Mood normal.        Thought Content: Thought content normal.        Judgment: Judgment normal.     Results for orders placed or performed in visit on 02/23/19  TSH  Result Value Ref Range   TSH 1.760 0.450 - 4.500 uIU/mL  CBC with Differential/Platelet out  Result Value Ref Range   WBC 7.0 3.4 - 10.8  x10E3/uL   RBC 4.76 3.77 - 5.28 x10E6/uL   Hemoglobin 14.5 11.1 - 15.9 g/dL   Hematocrit 54.0 98.1 - 46.6 %   MCV 90 79 - 97 fL   MCH 30.5 26.6 - 33.0 pg   MCHC 34.0 31.5 - 35.7 g/dL   RDW 19.1 47.8 - 29.5 %   Platelets 396 150 - 450 x10E3/uL   Neutrophils 55 Not Estab. %   Lymphs 32 Not Estab. %   Monocytes 7 Not Estab. %   Eos 5 Not Estab. %   Basos 1 Not Estab. %   Neutrophils Absolute 3.8 1.4 - 7.0 x10E3/uL   Lymphocytes Absolute 2.2 0.7 - 3.1 x10E3/uL   Monocytes Absolute 0.5 0.1 - 0.9 x10E3/uL   EOS (ABSOLUTE) 0.4 0.0 - 0.4 x10E3/uL   Basophils Absolute 0.1 0.0 - 0.2 x10E3/uL   Immature Granulocytes 0 Not Estab. %   Immature Grans (Abs) 0.0 0.0 - 0.1 x10E3/uL      Assessment & Plan:   Problem List Items Addressed This Visit      Other   Severe recurrent major depression without psychotic features (HCC) - Primary    Improved with cymbalta. Continue current regimen      Relevant Medications   DULoxetine (CYMBALTA) 20 MG capsule   Attention deficit hyperactivity disorder (ADHD)    Stable and improved with re-addition of adderall. Happy with current dose. Continue current regimen with close monitoring          Follow up plan: Return in about 3 months (around 06/24/2019) for ADHD f/u.

## 2019-03-26 NOTE — Assessment & Plan Note (Signed)
Stable and improved with re-addition of adderall. Happy with current dose. Continue current regimen with close monitoring

## 2019-03-26 NOTE — Assessment & Plan Note (Signed)
Improved with cymbalta. Continue current regimen

## 2019-07-06 ENCOUNTER — Ambulatory Visit: Payer: Self-pay | Admitting: Family Medicine

## 2019-07-07 ENCOUNTER — Telehealth (INDEPENDENT_AMBULATORY_CARE_PROVIDER_SITE_OTHER): Payer: PRIVATE HEALTH INSURANCE | Admitting: Family Medicine

## 2019-07-07 ENCOUNTER — Encounter: Payer: Self-pay | Admitting: Family Medicine

## 2019-07-07 DIAGNOSIS — F419 Anxiety disorder, unspecified: Secondary | ICD-10-CM

## 2019-07-07 DIAGNOSIS — F332 Major depressive disorder, recurrent severe without psychotic features: Secondary | ICD-10-CM

## 2019-07-07 DIAGNOSIS — F900 Attention-deficit hyperactivity disorder, predominantly inattentive type: Secondary | ICD-10-CM

## 2019-07-07 MED ORDER — DULOXETINE HCL 20 MG PO CPEP: 20.0000 mg | ORAL_CAPSULE | Freq: Every day | ORAL | 1 refills | Status: DC

## 2019-07-07 MED ORDER — AMPHETAMINE-DEXTROAMPHETAMINE 10 MG PO TABS: 10.0000 mg | ORAL_TABLET | Freq: Two times a day (BID) | ORAL | 0 refills | Status: DC | PRN

## 2019-07-07 NOTE — Progress Notes (Signed)
LMP 04/24/2015    Subjective:    Patient ID: Karen Marsh, female    DOB: 02-03-1981, 39 y.o.   MRN: [id number]  HPI: Karen Marsh is a 39 y.o. female  Chief Complaint  Patient presents with  . ADHD  . Depression    . This visit was completed via MyChart due to the restrictions of the COVID-19 pandemic. All issues as above were discussed and addressed. Physical exam was done as above through visual confirmation on MyChart. If it was felt that the patient should be evaluated in the office, they were directed there. The patient verbally consented to this visit. . Location of the patient: home . Location of the provider: home . Those involved with this call:  . Provider: Roosvelt Maser, PA-C . CMA: Elton Sin, CMA . Front Desk/Registration: Harriet Pho  . Time spent on call: 25 minutes with patient face to face via video conference. More than 50% of this time was spent in counseling and coordination of care. 5 minutes total spent in review of patient's record and preparation of their chart. I verified patient identity using two factors (patient name and date of birth). Patient consents verbally to being seen via telemedicine visit today.   Feels the cymbalta is helping quite a bit with her moods and anxiety. Has been having a lot of life changes lately with new house and new job so feels very stressed, scattered thoughts, poor focus. Adderall XR causing issues and states she does not like the way it wears off on her, makes her feel bad when that happens in the afternoons. Has been experimenting with the old script she had of 10 mg short acting adderall and feels when she takes this 1-2 times daily this works really well for her without side effects.  Depression screen [facility] 2/9 07/07/2019 03/26/2019 02/23/2019  Decreased Interest 0 0 3  Down, Depressed, Hopeless 0 1 2  PHQ - 2 Score 0 1 5  Altered sleeping 0 1 3  Tired, decreased energy 2 1 3   Change in appetite 0 1 3  Feeling bad  or failure about yourself  0 0 1  Trouble concentrating 1 1 3   Moving slowly or fidgety/restless 0 0 2  Suicidal thoughts 0 0 0  PHQ-9 Score 3 5 20   Difficult doing work/chores Somewhat difficult Somewhat difficult Somewhat difficult    Relevant past medical, surgical, family and social history reviewed and updated as indicated. Interim medical history since our last visit reviewed. Allergies and medications reviewed and updated.  Review of Systems  Per HPI unless specifically indicated above     Objective:    LMP 04/24/2015   Wt Readings from Last 3 Encounters:  03/26/19 134 lb (60.8 kg)  02/23/19 143 lb 6.4 oz (65 kg)  06/10/17 125 lb (56.7 kg)    Physical Exam Vitals and nursing note reviewed.  Constitutional:      General: She is not in acute distress.    Appearance: Normal appearance.  HENT:     Head: Atraumatic.     Right Ear: External ear normal.     Left Ear: External ear normal.     Nose: Nose normal. No congestion.     Mouth/Throat:     Mouth: Mucous membranes are moist.     Pharynx: Oropharynx is clear. No posterior oropharyngeal erythema.  Eyes:     Extraocular Movements: Extraocular movements intact.     Conjunctiva/sclera: Conjunctivae normal.  Cardiovascular:  Comments: Unable to assess via virtual visit Pulmonary:     Effort: Pulmonary effort is normal. No respiratory distress.  Musculoskeletal:        General: Normal range of motion.     Cervical back: Normal range of motion.  Skin:    General: Skin is dry.     Findings: No erythema.  Neurological:     Mental Status: She is alert and oriented to person, place, and time.  Psychiatric:        Mood and Affect: Mood normal.        Thought Content: Thought content normal.        Judgment: Judgment normal.     Results for orders placed or performed in visit on 02/23/19  TSH  Result Value Ref Range   TSH 1.760 0.450 - 4.500 uIU/mL  CBC with Differential/Platelet out  Result Value Ref Range     WBC 7.0 3.4 - 10.8 x10E3/uL   RBC 4.76 3.77 - 5.28 x10E6/uL   Hemoglobin 14.5 11.1 - 15.9 g/dL   Hematocrit 04.5 40.9 - 46.6 %   MCV 90 79 - 97 fL   MCH 30.5 26.6 - 33.0 pg   MCHC 34.0 31.5 - 35.7 g/dL   RDW 81.1 91.4 - 78.2 %   Platelets 396 150 - 450 x10E3/uL   Neutrophils 55 Not Estab. %   Lymphs 32 Not Estab. %   Monocytes 7 Not Estab. %   Eos 5 Not Estab. %   Basos 1 Not Estab. %   Neutrophils Absolute 3.8 1.4 - 7.0 x10E3/uL   Lymphocytes Absolute 2.2 0.7 - 3.1 x10E3/uL   Monocytes Absolute 0.5 0.1 - 0.9 x10E3/uL   EOS (ABSOLUTE) 0.4 0.0 - 0.4 x10E3/uL   Basophils Absolute 0.1 0.0 - 0.2 x10E3/uL   Immature Granulocytes 0 Not Estab. %   Immature Grans (Abs) 0.0 0.0 - 0.1 x10E3/uL      Assessment & Plan:   Problem List Items Addressed This Visit      Other   Severe recurrent major depression without psychotic features (HCC)    Stable and under good control, continue current regimen      Relevant Medications   DULoxetine (CYMBALTA) 20 MG capsule   Attention deficit hyperactivity disorder (ADHD) - Primary    Not tolerating adderall XR well, will start adderall regular release 1-2 times daily       Anxiety    Stable on cymablta, continue current regimen      Relevant Medications   DULoxetine (CYMBALTA) 20 MG capsule       Follow up plan: Return in about 3 months (around 10/07/2019) for ADHD f/u.

## 2019-07-07 NOTE — Assessment & Plan Note (Signed)
Not tolerating adderall XR well, will start adderall regular release 1-2 times daily

## 2019-07-07 NOTE — Assessment & Plan Note (Signed)
Stable on cymablta, continue current regimen

## 2019-07-07 NOTE — Assessment & Plan Note (Signed)
Stable and under good control, continue current regimen 

## 2019-09-23 ENCOUNTER — Encounter: Payer: Self-pay | Admitting: Family Medicine

## 2019-09-23 ENCOUNTER — Telehealth (INDEPENDENT_AMBULATORY_CARE_PROVIDER_SITE_OTHER): Payer: PRIVATE HEALTH INSURANCE | Admitting: Family Medicine

## 2019-09-23 VITALS — Wt 133.0 lb

## 2019-09-23 DIAGNOSIS — F900 Attention-deficit hyperactivity disorder, predominantly inattentive type: Secondary | ICD-10-CM

## 2019-09-23 DIAGNOSIS — M79605 Pain in left leg: Secondary | ICD-10-CM

## 2019-09-23 MED ORDER — AMPHETAMINE-DEXTROAMPHETAMINE 10 MG PO TABS: 10.0000 mg | ORAL_TABLET | Freq: Two times a day (BID) | ORAL | 0 refills | Status: DC

## 2019-09-23 MED ORDER — AMPHETAMINE-DEXTROAMPHETAMINE 10 MG PO TABS: 10.0000 mg | ORAL_TABLET | Freq: Two times a day (BID) | ORAL | 0 refills | Status: DC | PRN

## 2019-09-23 NOTE — Progress Notes (Signed)
Wt 133 lb (60.3 kg)   LMP 04/24/2015   BMI 24.25 kg/m    Subjective:    Patient ID: Karen Marsh, female    DOB: 01-06-1981, 38 y.o.   MRN: [id number]  HPI: Karen Marsh is a 39 y.o. female  Chief Complaint  Patient presents with  . ADHD    . This visit was completed via MyChart due to the restrictions of the COVID-19 pandemic. All issues as above were discussed and addressed. Physical exam was done as above through visual confirmation on MyChart. If it was felt that the patient should be evaluated in the office, they were directed there. The patient verbally consented to this visit. . Location of the patient: home . Location of the provider: work . Those involved with this call:  . Provider: Roosvelt Maser, PA-C . CMA: Elton Sin, CMA . Front Desk/Registration: Harriet Pho  . Time spent on call: 25 minutes with patient face to face via video conference. More than 50% of this time was spent in counseling and coordination of care. 5 minutes total spent in review of patient's record and preparation of their chart. I verified patient identity using two factors (patient name and date of birth). Patient consents verbally to being seen via telemedicine visit today.   Here today for ADHD f/u. Currently on adderall 10 mg BID prn and tolerating well with excellent benefit to focus, task completion and organization of thoughts. Denies CP, SOB, palpitations, sleep or appetite issues.   A few years ago started having left leg pain behind knee during a long car drive, seemed to flare up during car trips since but not flaring more frequently and without obvious trigger. Denies redness or swelling in leg, CP, SOB, palpitations. Ice did sometimes help a bit when used. 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    Relevant past medical, surgical, family and social history reviewed and updated as indicated. Interim medical history since our last visit  reviewed. Allergies and medications reviewed and updated.  Review of Systems  Per HPI unless specifically indicated above     Objective:    Wt 133 lb (60.3 kg)   LMP 04/24/2015   BMI 24.25 kg/m   Wt Readings from Last 3 Encounters:  09/23/19 133 lb (60.3 kg)  03/26/19 134 lb (60.8 kg)  02/23/19 143 lb 6.4 oz (65 kg)    Physical Exam Vitals and nursing note reviewed.  Constitutional:      General: She is not in acute distress.    Appearance: Normal appearance.  HENT:     Head: Atraumatic.     Right Ear: External ear normal.     Left Ear: External ear normal.     Nose: Nose normal. No congestion.     Mouth/Throat:     Mouth: Mucous membranes are moist.     Pharynx: Oropharynx is clear. No posterior oropharyngeal erythema.  Eyes:     Extraocular Movements: Extraocular movements intact.     Conjunctiva/sclera: Conjunctivae normal.  Cardiovascular:     Comments: Unable to assess via virtual visit Pulmonary:     Effort: Pulmonary effort is normal. No respiratory distress.  Musculoskeletal:        General: No swelling or deformity. Normal range of motion.     Cervical back: Normal range of motion.  Skin:    General: Skin is dry.     Findings: No erythema.  Neurological:     Mental Status: She is alert  and oriented to person, place, and time.  Psychiatric:        Mood and Affect: Mood normal.        Thought Content: Thought content normal.        Judgment: Judgment normal.    Results for orders placed or performed in visit on 02/23/19  TSH  Result Value Ref Range   TSH 1.760 0.450 - 4.500 uIU/mL  CBC with Differential/Platelet out  Result Value Ref Range   WBC 7.0 3.4 - 10.8 x10E3/uL   RBC 4.76 3.77 - 5.28 x10E6/uL   Hemoglobin 14.5 11.1 - 15.9 g/dL   Hematocrit 13.0 86.5 - 46.6 %   MCV 90 79 - 97 fL   MCH 30.5 26.6 - 33.0 pg   MCHC 34.0 31 - 35 g/dL   RDW 78.4 69.6 - 29.5 %   Platelets 396 150 - 450 x10E3/uL   Neutrophils 55 Not Estab. %   Lymphs 32 Not  Estab. %   Monocytes 7 Not Estab. %   Eos 5 Not Estab. %   Basos 1 Not Estab. %   Neutrophils Absolute 3.8 1 - 7 x10E3/uL   Lymphocytes Absolute 2.2 0 - 3 x10E3/uL   Monocytes Absolute 0.5 0 - 0 x10E3/uL   EOS (ABSOLUTE) 0.4 0.0 - 0.4 x10E3/uL   Basophils Absolute 0.1 0 - 0 x10E3/uL   Immature Granulocytes 0 Not Estab. %   Immature Grans (Abs) 0.0 0.0 - 0.1 x10E3/uL      Assessment & Plan:   Problem List Items Addressed This Visit      Other   Attention deficit hyperactivity disorder (ADHD) - Primary    Stable and well controlled, continue current regimen       Other Visit Diagnoses    Left leg pain       Given location, risk factors will order DVT u/s for r/o. Discussed to go to ER for worsening sxs in meantime   Relevant Orders   US Venous Img Lower Unilateral Left (DVT)       Follow up plan: Return in about 3 months (around 12/24/2019) for 6 month f/u.

## 2019-09-23 NOTE — Assessment & Plan Note (Signed)
Stable and well controlled, continue current regimen 

## 2019-10-15 ENCOUNTER — Encounter: Payer: Self-pay | Admitting: Family Medicine

## 2019-10-21 ENCOUNTER — Ambulatory Visit: Payer: Self-pay

## 2019-10-21 ENCOUNTER — Other Ambulatory Visit: Payer: Self-pay

## 2019-10-21 ENCOUNTER — Ambulatory Visit
Admission: RE | Admit: 2019-10-21 | Discharge: 2019-10-21 | Disposition: A | Discharge status: Home / Self Care | Payer: Self-pay | Source: Ambulatory Visit | Attending: Family Medicine | Admitting: Family Medicine

## 2019-10-21 DIAGNOSIS — M79605 Pain in left leg: Secondary | ICD-10-CM | POA: Insufficient documentation

## 2019-11-25 ENCOUNTER — Ambulatory Visit: Payer: Self-pay | Admitting: *Deleted

## 2019-11-25 NOTE — Telephone Encounter (Signed)
Pt is already taking Mucinex. She is concerned it is turning into something else. Mucus is green and has some blood in it.

## 2019-11-25 NOTE — Telephone Encounter (Signed)
  C/o covid symptoms getting worse. Diagnosed on 11/17/19 last Tuesday. Increasing cough with green mucus. Fever decreased today to 99. Alternating tylenol and ibuprofen.  symptoms denied are vomiting and diarrhea difficulty breathing. Mild SOB at times when coughing occurs per patient . MyChart visit scheduled for 11/28/19. Care advise given. Isolation precautions reviewed. Patient requesting any medications from PCP if available for green mucus. Patient verbalized understanding of care advise and to go to ED if symptoms worsen.  Reason for Disposition . [1] Fever returns after gone for over 24 hours AND [2] symptoms worse or not improved  Answer Assessment - Initial Assessment Questions 1. COVID-19 DIAGNOSIS: "Who made your Coronavirus (COVID-19) diagnosis?" "Was it confirmed by a positive lab test?" If not diagnosed by a HCP, ask "Are there lots of cases (community spread) where you live?" (See public health department website, if unsure)     Fast med 2. COVID-19 EXPOSURE: "Was there any known exposure to COVID before the symptoms began?" CDC Definition of close contact: within 6 feet (2 meters) for a total of 15 minutes or more over a 24-hour period.      I dont know [place] was at the beach 3. ONSET: "When did the COVID-19 symptoms start?"      11/17/19 4. WORST SYMPTOM: "What is your worst symptom?" (e.g., cough, fever, shortness of breath, muscle aches)     Body aches coughing spells  5. COUGH: "Do you have a cough?" If Yes, ask: "How bad is the cough?"       Yes, getting worse coughing up green mucus 6. FEVER: "Do you have a fever?" If Yes, ask: "What is your temperature, how was it measured, and when did it start?"     Yes 99 today and has been over 100 7. RESPIRATORY STATUS: "Describe your breathing?" (e.g., shortness of breath, wheezing, unable to speak)      Little wheezing 8. BETTER-SAME-WORSE: "Are you getting better, staying the same or getting worse compared to yesterday?"  If getting  worse, ask, "In what way?"     Getting worse 9. HIGH RISK DISEASE: "Do you have any chronic medical problems?" (e.g., asthma, heart or lung disease, weak immune system, obesity, etc.)     Hx stroke  10. PREGNANCY: "Is there any chance you are pregnant?" "When was your last menstrual period?"       Na hysterectomy 11. OTHER SYMPTOMS: "Do you have any other symptoms?"  (e.g., chills, fatigue, headache, loss of smell or taste, muscle pain, sore throat; new loss of smell or taste especially support the diagnosis of COVID-19)       Loss of taste and smell, muscle pain, sore throat at first, fever, nausea  Protocols used: CORONAVIRUS (COVID-19) DIAGNOSED OR SUSPECTED-A-AH

## 2019-11-25 NOTE — Telephone Encounter (Signed)
Patient notified of Rachel's message. Will try to go to [facility] tomorrow. Keeping appointment with [place] for Friday just incase she cannot get into [facility].

## 2019-11-25 NOTE — Telephone Encounter (Signed)
Can move appt up or go to UC - she would certainly benefit from an in person exam from the sounds of it

## 2019-11-25 NOTE — Telephone Encounter (Signed)
Can try mucinex

## 2019-11-27 ENCOUNTER — Telehealth: Payer: Self-pay | Admitting: Family Medicine

## 2019-12-07 ENCOUNTER — Telehealth: Payer: Self-pay | Admitting: Family Medicine

## 2019-12-07 NOTE — Telephone Encounter (Signed)
Requested medication (s) are due for refill today: Yes  Requested medication (s) are on the active medication list: Yes  Last refill: 2017  Future visit scheduled: No  Notes to clinic:  Unable to refill, expired Rx     Requested Prescriptions  Pending Prescriptions Disp Refills   albuterol (PROAIR HFA) 108 (90 Base) MCG/ACT inhaler      Sig: Inhale 2 puffs into the lungs as needed.      Pulmonology:  Beta Agonists Failed - 12/07/2019  3:42 PM      Failed - One inhaler should last at least one month. If the patient is requesting refills earlier, contact the patient to check for uncontrolled symptoms.      Passed - Valid encounter within last 12 months    Recent Outpatient Visits           2 months ago Attention deficit hyperactivity disorder (ADHD), predominantly inattentive type   [facility] [place], [place]   5 months ago Attention deficit hyperactivity disorder (ADHD), predominantly inattentive type   [place], [place], [place]   8 months ago Severe recurrent major depression without psychotic features [facility])   [place], [place], [place]   9 months ago Severe recurrent major depression without psychotic features [facility])   [facility] [place], [place]   2 years ago BV (bacterial vaginosis)   [place], [place], [place]

## 2019-12-07 NOTE — Telephone Encounter (Signed)
PT need a refill albuterol [facility] HFA) 108 (90 Base) MCG/ACT inhaler [place], [place]  [place] [address]  Phone: [phone] Fax: [phone]

## 2019-12-09 ENCOUNTER — Other Ambulatory Visit: Payer: Self-pay | Admitting: Nurse Practitioner

## 2019-12-09 MED ORDER — ALBUTEROL SULFATE HFA 108 (90 BASE) MCG/ACT IN AERS: 2.0000 | INHALATION_SPRAY | RESPIRATORY_TRACT | 4 refills | Status: DC | PRN

## 2019-12-09 NOTE — Telephone Encounter (Signed)
Pt would like an update on RX request, Please advise.

## 2019-12-09 NOTE — Telephone Encounter (Signed)
Refills sent to [facility]. 

## 2020-06-22 ENCOUNTER — Other Ambulatory Visit: Payer: Self-pay | Admitting: Internal Medicine

## 2020-06-22 MED ORDER — DULOXETINE HCL 20 MG PO CPEP: 20.0000 mg | ORAL_CAPSULE | Freq: Every day | ORAL | 0 refills | Status: DC

## 2020-06-22 NOTE — Telephone Encounter (Signed)
Pt is calling in to request a refill for DULoxetine (CYMBALTA) 20 MG capsule.    Pharmacy: [place], [place]  [address] [place] [address]  Phone:  [phone] Fax:  [phone]

## 2020-06-22 NOTE — Telephone Encounter (Signed)
Refill request for Duloxetine; no valid encounter within last 6 months; no upcoming visits noted; pt notified; she is to be seen by Dr Neomia Dear, [place]; pt informed courtesy refill to be sent but visit needed for additional refills; she verbalized understanding; pt transferred to [facility] for scheduling. Requested Prescriptions  Pending Prescriptions Disp Refills  . DULoxetine (CYMBALTA) 20 MG capsule 90 capsule 1    Sig: Take 1 capsule (20 mg total) by mouth daily.     Psychiatry: Antidepressants - SNRI Failed - 06/22/2020  1:08 PM      Failed - Valid encounter within last 6 months    Recent Outpatient Visits          9 months ago Attention deficit hyperactivity disorder (ADHD), predominantly inattentive type   [facility] [place], [place]   11 months ago Attention deficit hyperactivity disorder (ADHD), predominantly inattentive type   [facility] [place], [place]   1 year ago Severe recurrent major depression without psychotic features [facility])   [facility] [place], [place]   1 year ago Severe recurrent major depression without psychotic features [facility])   [facility] [place], [place]   3 years ago BV (bacterial vaginosis)   [facility], [place], [place]      Future Appointments            In 5 days Vigg, Avanti, MD [facility], [place] - Completed PHQ-2 or PHQ-9 in the last 360 days      Passed - Last BP in normal range    BP Readings from Last 1 Encounters:  02/23/19 126/87

## 2020-06-27 ENCOUNTER — Other Ambulatory Visit: Payer: Self-pay

## 2020-06-27 ENCOUNTER — Encounter: Payer: Self-pay | Admitting: Internal Medicine

## 2020-06-27 ENCOUNTER — Ambulatory Visit (INDEPENDENT_AMBULATORY_CARE_PROVIDER_SITE_OTHER): Payer: Self-pay | Admitting: Internal Medicine

## 2020-06-27 VITALS — BP 120/84 | HR 90 | Temp 98.6°F | Ht 62.0 in | Wt 140.6 lb

## 2020-06-27 DIAGNOSIS — F339 Major depressive disorder, recurrent, unspecified: Secondary | ICD-10-CM

## 2020-06-27 DIAGNOSIS — F988 Other specified behavioral and emotional disorders with onset usually occurring in childhood and adolescence: Secondary | ICD-10-CM

## 2020-06-27 MED ORDER — LORAZEPAM 0.5 MG PO TABS: 0.5000 mg | ORAL_TABLET | ORAL | 0 refills | Status: DC | PRN

## 2020-06-27 MED ORDER — DULOXETINE HCL 20 MG PO CPEP: 20.0000 mg | ORAL_CAPSULE | Freq: Every day | ORAL | 3 refills | Status: DC

## 2020-06-27 MED ORDER — AMPHETAMINE-DEXTROAMPHETAMINE 10 MG PO TABS: 10.0000 mg | ORAL_TABLET | Freq: Two times a day (BID) | ORAL | 0 refills | Status: DC | PRN

## 2020-06-27 NOTE — Progress Notes (Signed)
Ht 5\' 2"  (1.575 m)   Wt 140 lb 9.6 oz (63.8 kg)   LMP 04/24/2015   BMI 25.72 kg/m    Subjective:    Patient ID: Karen Marsh, female    DOB: 1980-11-25, 40 y.o.   MRN: [id number]  HPI: Karen Marsh is a 40 y.o. female  Medication Refill Pertinent negatives include no abdominal pain, chest pain, fever, headaches, numbness or weakness.  Back Pain This is a chronic problem. The current episode started more than 1 year ago (rt lower back - sexually active ). The pain is at a severity of 6/10. The pain is moderate. Pertinent negatives include no abdominal pain, bladder incontinence, bowel incontinence, chest pain, dysuria, fever, headaches, leg pain, numbness, paresis, paresthesias, pelvic pain, perianal numbness, tingling, weakness or weight loss.  Anxiety Presents for follow-up (has panic attacks ) visit. Symptoms include irritability and nervous/anxious behavior. Patient reports no chest pain, confusion, decreased concentration, depressed mood, dizziness, dry mouth, feeling of choking, hyperventilation, impotence, malaise, muscle tension, palpitations, panic, restlessness or shortness of breath. Primary symptoms comment: has panic palapations - has them about 3-4 months - triggers stress self employed .      Chief Complaint  Patient presents with  . Medication Refill    Would like to have Adderall, Cymbalta, and Lorazepam refilled today.  . Back Pain    Right lower back for about the past year or so.   . Breast exam    Patient would like to have a breast exam, she has random bumps that come and go for years. Patient states that she has had some  left breast tenderness for past few days.   [name]    Patient would like to have yearly lab work today.    Relevant past medical, surgical, family and social history reviewed and updated as indicated. Interim medical history since our last visit reviewed. Allergies and medications reviewed and updated.  Review of Systems   Constitutional: Positive for irritability. Negative for fever and weight loss.  Respiratory: Negative for shortness of breath.   Cardiovascular: Negative for chest pain and palpitations.  Gastrointestinal: Negative for abdominal pain and bowel incontinence.  Genitourinary: Negative for bladder incontinence, dysuria, impotence and pelvic pain.  Musculoskeletal: Positive for back pain.  Neurological: Negative for dizziness, tingling, weakness, numbness, headaches and paresthesias.  Psychiatric/Behavioral: Negative for confusion and decreased concentration. The patient is nervous/anxious.     Per HPI unless specifically indicated above     Objective:    Ht 5\' 2"  (1.575 m)   Wt 140 lb 9.6 oz (63.8 kg)   LMP 04/24/2015   BMI 25.72 kg/m   Wt Readings from Last 3 Encounters:  06/27/20 140 lb 9.6 oz (63.8 kg)  09/23/19 133 lb (60.3 kg)  03/26/19 134 lb (60.8 kg)    Physical Exam Vitals and nursing note reviewed.  Constitutional:      General: She is not in acute distress.    Appearance: Normal appearance. She is not ill-appearing or diaphoretic.  HENT:     Head: Normocephalic and atraumatic.     Right Ear: Tympanic membrane and external ear normal. There is no impacted cerumen.     Left Ear: External ear normal.     Nose: No congestion or rhinorrhea.     Mouth/Throat:     Pharynx: No oropharyngeal exudate or posterior oropharyngeal erythema.  Eyes:     Conjunctiva/sclera: Conjunctivae normal.     Pupils: Pupils are equal, round, and  reactive to light.  Cardiovascular:     Rate and Rhythm: Normal rate and regular rhythm.     Heart sounds: No murmur heard. No friction rub. No gallop.   Pulmonary:     Effort: No respiratory distress.     Breath sounds: No stridor. No wheezing or rhonchi.  Chest:     Chest wall: No tenderness.  Abdominal:     General: Abdomen is flat. Bowel sounds are normal. There is no distension.     Palpations: Abdomen is soft. There is no mass.      Tenderness: There is no abdominal tenderness. There is no guarding.  Musculoskeletal:        General: No swelling or deformity.     Cervical back: Normal range of motion and neck supple. No rigidity or tenderness.     Right lower leg: No edema.     Left lower leg: No edema.  Skin:    Coloration: Skin is not jaundiced.     Findings: No erythema.  Neurological:     Mental Status: She is alert and oriented to person, place, and time. Mental status is at baseline.  Psychiatric:        Mood and Affect: Mood normal.        Behavior: Behavior normal.        Thought Content: Thought content normal.        Judgment: Judgment normal.     Results for orders placed or performed in visit on 02/23/19  TSH  Result Value Ref Range   TSH 1.760 0.450 - 4.500 uIU/mL  CBC with Differential/Platelet out  Result Value Ref Range   WBC 7.0 3.4 - 10.8 x10E3/uL   RBC 4.76 3.77 - 5.28 x10E6/uL   Hemoglobin 14.5 11.1 - 15.9 g/dL   Hematocrit 33.2 95.1 - 46.6 %   MCV 90 79 - 97 fL   MCH 30.5 26.6 - 33.0 pg   MCHC 34.0 31.5 - 35.7 g/dL   RDW 88.4 16.6 - 06.3 %   Platelets 396 150 - 450 x10E3/uL   Neutrophils 55 Not Estab. %   Lymphs 32 Not Estab. %   Monocytes 7 Not Estab. %   Eos 5 Not Estab. %   Basos 1 Not Estab. %   Neutrophils Absolute 3.8 1.4 - 7.0 x10E3/uL   Lymphocytes Absolute 2.2 0.7 - 3.1 x10E3/uL   Monocytes Absolute 0.5 0.1 - 0.9 x10E3/uL   EOS (ABSOLUTE) 0.4 0.0 - 0.4 x10E3/uL   Basophils Absolute 0.1 0.0 - 0.2 x10E3/uL   Immature Granulocytes 0 Not Estab. %   Immature Grans (Abs) 0.0 0.0 - 0.1 x10E3/uL      Assessment & Plan:  1. Anxiety :  Is on ativan for panic attacks will need to take this.  2. ADD : is on adderall for such  Will refer psych.  Patient is self-pay referral to such been evaluated by an MD was seen by psychologist per her verbal record however no notes available this was more than 5 years ago.  Will need to be reevaluated if she absolutely needs Adderall.  She  says she takes this as needed.  Unsure if this is something she will need long-term. We will refill for now patient aware that no more refills will be provided and she will need to see a psychiatrist for further follow-up and management of her ADD. Verbalized understanding of the above  3. Depression Is on cymbalta able chronic refill supplied check TSH  .4.  Back pain probably secondary to muscle strain to take over-the-counter ibuprofen use heat and cold packs stretching exercises advised to patient. We will check a UA rule out a UTI   Problem List Items Addressed This Visit   None      Follow up plan: No follow-ups on file.

## 2020-06-28 ENCOUNTER — Other Ambulatory Visit: Payer: Self-pay | Admitting: Internal Medicine

## 2020-06-28 ENCOUNTER — Encounter: Payer: Self-pay | Admitting: Internal Medicine

## 2020-06-28 LAB — CBC WITH DIFFERENTIAL/PLATELET
Basophils Absolute: 0.1 10*3/uL (ref 0.0–0.2)
Basos: 1 %
EOS (ABSOLUTE): 0.2 10*3/uL (ref 0.0–0.4)
Eos: 3 %
Hematocrit: 38.3 % (ref 34.0–46.6)
Hemoglobin: 13.1 g/dL (ref 11.1–15.9)
Immature Grans (Abs): 0 10*3/uL (ref 0.0–0.1)
Immature Granulocytes: 0 %
Lymphocytes Absolute: 2.5 10*3/uL (ref 0.7–3.1)
Lymphs: 27 %
MCH: 29.6 pg (ref 26.6–33.0)
MCHC: 34.2 g/dL (ref 31.5–35.7)
MCV: 87 fL (ref 79–97)
Monocytes Absolute: 0.6 10*3/uL (ref 0.1–0.9)
Monocytes: 6 %
Neutrophils Absolute: 5.9 10*3/uL (ref 1.4–7.0)
Neutrophils: 63 %
Platelets: 394 10*3/uL (ref 150–450)
RBC: 4.43 x10E6/uL (ref 3.77–5.28)
RDW: 12.3 % (ref 11.7–15.4)
WBC: 9.3 10*3/uL (ref 3.4–10.8)

## 2020-06-28 LAB — URINALYSIS, ROUTINE W REFLEX MICROSCOPIC
Bilirubin, UA: NEGATIVE
Glucose, UA: NEGATIVE
Ketones, UA: NEGATIVE
Leukocytes,UA: NEGATIVE
Nitrite, UA: NEGATIVE
Protein,UA: NEGATIVE
RBC, UA: NEGATIVE
Specific Gravity, UA: 1.015 (ref 1.005–1.030)
Urobilinogen, Ur: 0.2 mg/dL (ref 0.2–1.0)
pH, UA: 7 (ref 5.0–7.5)

## 2020-06-28 LAB — COMPREHENSIVE METABOLIC PANEL
ALT: 17 IU/L (ref 0–32)
AST: 21 IU/L (ref 0–40)
Albumin/Globulin Ratio: 2 (ref 1.2–2.2)
Albumin: 4.5 g/dL (ref 3.8–4.8)
Alkaline Phosphatase: 60 IU/L (ref 44–121)
BUN/Creatinine Ratio: 15 (ref 9–23)
BUN: 9 mg/dL (ref 6–20)
Bilirubin Total: 0.2 mg/dL (ref 0.0–1.2)
CO2: 21 mmol/L (ref 20–29)
Calcium: 9.5 mg/dL (ref 8.7–10.2)
Chloride: 102 mmol/L (ref 96–106)
Creatinine, Ser: 0.6 mg/dL (ref 0.57–1.00)
Globulin, Total: 2.2 g/dL (ref 1.5–4.5)
Glucose: 115 mg/dL — ABNORMAL HIGH (ref 65–99)
Potassium: 3.7 mmol/L (ref 3.5–5.2)
Sodium: 139 mmol/L (ref 134–144)
Total Protein: 6.7 g/dL (ref 6.0–8.5)
eGFR: 118 mL/min/{1.73_m2} (ref >59)

## 2020-06-28 LAB — TSH: TSH: 1.79 u[IU]/mL (ref 0.450–4.500)

## 2020-06-28 LAB — HIV ANTIBODY (ROUTINE TESTING W REFLEX): HIV Screen 4th Generation wRfx: NONREACTIVE

## 2020-06-28 MED ORDER — LORAZEPAM 0.5 MG PO TABS
0.5000 mg | ORAL_TABLET | ORAL | 0 refills | Status: DC | PRN
Start: 2020-06-28 — End: 2020-09-12

## 2020-06-28 MED ORDER — LORAZEPAM 0.5 MG PO TABS
0.5000 mg | ORAL_TABLET | ORAL | 0 refills | Status: DC | PRN
Start: 2020-06-28 — End: 2020-06-28

## 2020-08-31 ENCOUNTER — Telehealth: Payer: Self-pay

## 2020-08-31 ENCOUNTER — Other Ambulatory Visit: Payer: Self-pay | Admitting: Family Medicine

## 2020-08-31 NOTE — Telephone Encounter (Signed)
Pt has apt on 09/12/2020

## 2020-08-31 NOTE — Telephone Encounter (Signed)
Copied from [place] [phone]. Topic: Quick Communication - Rx Refill/Question >> Aug 31, 2020 12:35 PM Lenon Curt, Rana Snare A wrote: Medication: DULoxetine (CYMBALTA) 20 MG capsule [[id number]]   LORazepam (ATIVAN) 0.5 MG tablet [[id number]]   Has the patient contacted their pharmacy? No Patient was uncertain of their medication's status  Preferred Pharmacy (with phone number or street name): [facility], [place]  Phone:  [phone] Fax:  [phone]   Agent: Please be advised that RX refills may take up to 3 business days. We ask that you follow-up with your pharmacy.

## 2020-08-31 NOTE — Telephone Encounter (Signed)
Requested medication (s) are due for refill today - yes  Requested medication (s) are on the active medication list -yes  Future visit scheduled -yes  Last refill: 06/28/20  Notes to clinic: Request non delegated Rx  Requested Prescriptions  Pending Prescriptions Disp Refills   LORazepam (ATIVAN) 0.5 MG tablet 10 tablet 0    Sig: Take 1 tablet (0.5 mg total) by mouth as needed for anxiety.      Not Delegated - Psychiatry:  Anxiolytics/Hypnotics Failed - 08/31/2020 12:47 PM      Failed - This refill cannot be delegated      Failed - Urine Drug Screen completed in last 360 days      Passed - Valid encounter within last 6 months    Recent Outpatient Visits           2 months ago Depression, recurrent ([place])   [place], [place]   11 months ago Attention deficit hyperactivity disorder (ADHD), predominantly inattentive type   [facility], Lilia [place], [place]   1 year ago Attention deficit hyperactivity disorder (ADHD), predominantly inattentive type   [facility] [place], [place]   1 year ago Severe recurrent major depression without psychotic features [facility])   [facility] [place], [place]   1 year ago Severe recurrent major depression without psychotic features [facility])   [place], [place], [place]       Future Appointments             In 1 week Wynetta Emery, Megan P, DO [place], [facility]              Refused Prescriptions Disp Refills   DULoxetine (CYMBALTA) 20 MG capsule 30 capsule 3    Sig: Take 1 capsule (20 mg total) by mouth daily.      Psychiatry: Antidepressants - SNRI Passed - 08/31/2020 12:47 PM      Passed - Completed PHQ-2 or PHQ-9 in the last 360 days      Passed - Last BP in normal range    BP Readings from Last 1 Encounters:  06/27/20 120/84          Passed - Valid encounter within last 6 months    Recent Outpatient Visits            2 months ago Depression, recurrent ([place])   [facility], [place], MD   11 months ago Attention deficit hyperactivity disorder (ADHD), predominantly inattentive type   [facility], [place], [place]   1 year ago Attention deficit hyperactivity disorder (ADHD), predominantly inattentive type   [facility] [place], [place]   1 year ago Severe recurrent major depression without psychotic features [facility])   [facility] [place], [place]   1 year ago Severe recurrent major depression without psychotic features [facility])   [place], [place], [place]       Future Appointments             In 1 week Wynetta Emery, Barb Merino, DO [place], [facility]                 Requested Prescriptions  Pending Prescriptions Disp Refills   LORazepam (ATIVAN) 0.5 MG tablet 10 tablet 0    Sig: Take 1 tablet (0.5 mg total) by mouth as needed for anxiety.      Not Delegated - Psychiatry:  Anxiolytics/Hypnotics Failed -  08/31/2020 12:47 PM      Failed - This refill cannot be delegated      Failed - Urine Drug Screen completed in last 360 days      Passed - Valid encounter within last 6 months    Recent Outpatient Visits           2 months ago Depression, recurrent ([place])   [place], MD   11 months ago Attention deficit hyperactivity disorder (ADHD), predominantly inattentive type   [facility], [place], [place]   1 year ago Attention deficit hyperactivity disorder (ADHD), predominantly inattentive type   [facility] [place], [place]   1 year ago Severe recurrent major depression without psychotic features [facility])   [facility] [place], [place]   1 year ago Severe recurrent major depression without psychotic features [facility])   [place], [place], [place]        Future Appointments             In 1 week Wynetta Emery, Megan P, DO [place], [facility]              Refused Prescriptions Disp Refills   DULoxetine (CYMBALTA) 20 MG capsule 30 capsule 3    Sig: Take 1 capsule (20 mg total) by mouth daily.      Psychiatry: Antidepressants - SNRI Passed - 08/31/2020 12:47 PM      Passed - Completed PHQ-2 or PHQ-9 in the last 360 days      Passed - Last BP in normal range    BP Readings from Last 1 Encounters:  06/27/20 120/84          Passed - Valid encounter within last 6 months    Recent Outpatient Visits           2 months ago Depression, recurrent ([place])   [place], [place]   11 months ago Attention deficit hyperactivity disorder (ADHD), predominantly inattentive type   [facility], [place], [place]   1 year ago Attention deficit hyperactivity disorder (ADHD), predominantly inattentive type   [facility] [place], [place]   1 year ago Severe recurrent major depression without psychotic features [facility])   [facility] [place], [place]   1 year ago Severe recurrent major depression without psychotic features [facility])   [place], [place], [place]       Future Appointments             In 1 week Wynetta Emery, Barb Merino, DO [employer], [facility]

## 2020-08-31 NOTE — Telephone Encounter (Signed)
Copied from [place] [phone]. Topic: General - Other >> Aug 31, 2020 12:38 PM Tessa Lerner A wrote: Reason for CRM: Patient would like to be contacted regarding billing of their previous visit on 06/27/20  The patient would like to know if it's possible to have a portion of their charges returned  Please contact to further advise when possible   Pt stated practice admin was aware of the issue as she states she feels she should not of been billed for this visit. Pt aware practice admin will call.

## 2020-09-01 NOTE — Telephone Encounter (Signed)
Please find out if she's OK to wait until her appointment 6/6, otherwise very small number will be called in until then.

## 2020-09-02 NOTE — Telephone Encounter (Signed)
Left message for patient to give our office a call back to see if patient has enough medication to last until scheduled appointment per Dr.Johnson.

## 2020-09-02 NOTE — Telephone Encounter (Signed)
Please see below- does she have enough to last until 6/6?

## 2020-09-02 NOTE — Telephone Encounter (Signed)
LMOM advising patient that charges cannot be written off for DOS 06/27/20 because the visit took place and the provider did the work. Advised the patient to call me directly at [phone] with any questions.

## 2020-09-06 NOTE — Telephone Encounter (Signed)
Spoke with patient and she stated she should have enough medication until her scheduled appointment.

## 2020-09-12 ENCOUNTER — Ambulatory Visit (INDEPENDENT_AMBULATORY_CARE_PROVIDER_SITE_OTHER): Payer: Self-pay | Admitting: Family Medicine

## 2020-09-12 ENCOUNTER — Encounter: Payer: Self-pay | Admitting: Family Medicine

## 2020-09-12 ENCOUNTER — Other Ambulatory Visit: Payer: Self-pay

## 2020-09-12 VITALS — BP 120/84 | HR 80 | Temp 98.0°F | Ht 61.5 in | Wt 138.8 lb

## 2020-09-12 DIAGNOSIS — F900 Attention-deficit hyperactivity disorder, predominantly inattentive type: Secondary | ICD-10-CM

## 2020-09-12 DIAGNOSIS — F419 Anxiety disorder, unspecified: Secondary | ICD-10-CM

## 2020-09-12 DIAGNOSIS — N6321 Unspecified lump in the left breast, upper outer quadrant: Secondary | ICD-10-CM

## 2020-09-12 DIAGNOSIS — F332 Major depressive disorder, recurrent severe without psychotic features: Secondary | ICD-10-CM

## 2020-09-12 DIAGNOSIS — F339 Major depressive disorder, recurrent, unspecified: Secondary | ICD-10-CM

## 2020-09-12 DIAGNOSIS — Z79899 Other long term (current) drug therapy: Secondary | ICD-10-CM

## 2020-09-12 DIAGNOSIS — F988 Other specified behavioral and emotional disorders with onset usually occurring in childhood and adolescence: Secondary | ICD-10-CM

## 2020-09-12 DIAGNOSIS — J069 Acute upper respiratory infection, unspecified: Secondary | ICD-10-CM

## 2020-09-12 MED ORDER — AMPHETAMINE-DEXTROAMPHETAMINE 10 MG PO TABS: 10.0000 mg | ORAL_TABLET | Freq: Two times a day (BID) | ORAL | 0 refills | Status: DC

## 2020-09-12 MED ORDER — DULOXETINE HCL 30 MG PO CPEP: 30.0000 mg | ORAL_CAPSULE | Freq: Every day | ORAL | 2 refills | Status: DC

## 2020-09-12 MED ORDER — LIDOCAINE VISCOUS HCL 2 % MT SOLN: 15.0000 mL | OROMUCOSAL | 0 refills | Status: DC | PRN

## 2020-09-12 MED ORDER — AMPHETAMINE-DEXTROAMPHETAMINE 10 MG PO TABS: 10.0000 mg | ORAL_TABLET | Freq: Two times a day (BID) | ORAL | 0 refills | Status: DC | PRN

## 2020-09-12 MED ORDER — LORAZEPAM 0.5 MG PO TABS: 0.5000 mg | ORAL_TABLET | ORAL | 0 refills | Status: DC | PRN

## 2020-09-12 MED ORDER — PREDNISONE 50 MG PO TABS: 50.0000 mg | ORAL_TABLET | Freq: Every day | ORAL | 0 refills | Status: DC

## 2020-09-12 NOTE — Patient Instructions (Signed)
Call to schedule your mammogram/US: [facility] at [facility]  Address: [address], [place], [place] [address]  Phone: [phone]

## 2020-09-12 NOTE — Progress Notes (Signed)
BP 120/84   Pulse 80   Temp 98 F (36.7 C)   Ht 5' 1.5" (1.562 m)   Wt 138 lb 12.8 oz (63 kg)   LMP 04/24/2015   SpO2 98%   BMI 25.80 kg/m    Subjective:    Patient ID: Karen Marsh, female    DOB: Jun 06, 1980, 40 y.o.   MRN: [id number]  HPI: Karen Marsh is a 40 y.o. female  Chief Complaint  Patient presents with  . mood    Follow up   . Breast Mass    Patient states she has had a lump since about February in her left breast. Sometimes it is sore.   . Sinusitis    Patient states she is having a headache, sore throat, nasal congestion, right ear pain. Patient states she took at home COVID test last night and it was negative.    DEPRESSION- only taking the lorazpezpm about 1x a week Mood status: stable Satisfied with current treatment?: yes Symptom severity: mild  Duration of current treatment : chronic Side effects: no Medication compliance: excellent compliance Psychotherapy/counseling: no  Previous psychiatric medications: cymbalta Depressed mood: no Anxious mood: yes Anhedonia: no Significant weight loss or gain: no Insomnia: no  Fatigue: no Feelings of worthlessness or guilt: no Impaired concentration/indecisiveness: no Suicidal ideations: no Hopelessness: no Crying spells: no Depression screen [facility] 2/9 09/12/2020 06/27/2020 07/07/2019 03/26/2019 02/23/2019  Decreased Interest 0 0 0 0 3  Down, Depressed, Hopeless 0 0 0 1 2  PHQ - 2 Score 0 0 0 1 5  Altered sleeping 0 0 0 1 3  Tired, decreased energy 1 0 2 1 3   Change in appetite 0 0 0 1 3  Feeling bad or failure about yourself  0 0 0 0 1  Trouble concentrating 1 0 1 1 3   Moving slowly or fidgety/restless 0 0 0 0 2  Suicidal thoughts 0 0 0 0 0  PHQ-9 Score 2 0 3 5 20   Difficult doing work/chores Not difficult at all - Somewhat difficult Somewhat difficult Somewhat difficult   ADHD FOLLOW UP- just takes her medicine when she's working or really busy. She notes that she is taking 5-6 days a week ADHD  status: controlled Satisfied with current therapy: yes Medication compliance:  good compliance Controlled substance contract: yes Previous psychiatry evaluation: no Previous medications: yes    Taking meds on weekends/vacations: occasionally Work/school performance:  good Difficulty sustaining attention/completing tasks: no Distracted by extraneous stimuli: no Does not listen when spoken to: no  Fidgets with hands or feet: no Unable to stay in seat: no Blurts out/interrupts others: no ADHD Medication Side Effects: no    Decreased appetite: no    Headache: no    Sleeping disturbance pattern: no    Irritability: no    Rebound effects (worse than baseline) off medication: no    Anxiousness: no    Dizziness: no    Tics: no   BREAST LUMP Duration :4 months Location: left Onset: gradual Severity: moderate Quality: sore Frequency: intermittent Redness: no Swelling: no Trauma: no trauma Breastfeeding: no Associated with menstral cycle: unsure Nipple discharge: no Breast lump: yes Status: fluctuating Treatments attempted: none Previous mammogram: no   UPPER RESPIRATORY TRACT INFECTION Duration: 2 days Worst symptom: sore throat and congestion Fever: no Cough: yes Shortness of breath: no Wheezing: no Chest pain: no Chest tightness: no Chest congestion: no Nasal congestion: yes Runny nose: yes Post nasal drip: yes Sneezing: no Sore throat:  yes Swollen glands: yes Sinus pressure: yes Headache: no Face pain: no Toothache: yes Ear pain: yes, right  Ear pressure: no  Eyes red/itching:no Eye drainage/crusting: no  Vomiting: no Rash: no Fatigue: yes Sick contacts: no Strep contacts: no  Context: stable Recurrent sinusitis: no Relief with OTC cold/cough medications: no  Treatments attempted: cold/sinus and mucinex   Relevant past medical, surgical, family and social history reviewed and updated as indicated. Interim medical history since our last visit  reviewed. Allergies and medications reviewed and updated.  Review of Systems  Constitutional: Positive for fatigue. Negative for activity change, appetite change, chills, diaphoresis, fever and unexpected weight change.  HENT: Positive for congestion, postnasal drip, rhinorrhea, sinus pressure and sore throat. Negative for dental problem, drooling, ear discharge, ear pain, facial swelling, hearing loss, mouth sores, nosebleeds, sinus pain, sneezing, tinnitus, trouble swallowing and voice change.   Eyes: Negative.   Respiratory: Negative.   Cardiovascular: Negative.   Gastrointestinal: Negative.   Musculoskeletal: Negative.   Neurological: Negative.   Psychiatric/Behavioral: Negative.     Per HPI unless specifically indicated above     Objective:    BP 120/84   Pulse 80   Temp 98 F (36.7 C)   Ht 5' 1.5" (1.562 m)   Wt 138 lb 12.8 oz (63 kg)   LMP 04/24/2015   SpO2 98%   BMI 25.80 kg/m   Wt Readings from Last 3 Encounters:  09/12/20 138 lb 12.8 oz (63 kg)  06/27/20 140 lb 9.6 oz (63.8 kg)  09/23/19 133 lb (60.3 kg)    Physical Exam Vitals and nursing note reviewed. Exam conducted with a chaperone present.  Constitutional:      General: She is not in acute distress.    Appearance: Normal appearance. She is not ill-appearing, toxic-appearing or diaphoretic.  HENT:     Head: Normocephalic and atraumatic.     Right Ear: External ear normal.     Left Ear: External ear normal.     Nose: Nose normal.     Mouth/Throat:     Mouth: Mucous membranes are moist.     Pharynx: Oropharynx is clear.  Eyes:     General: No scleral icterus.       Right eye: No discharge.        Left eye: No discharge.     Extraocular Movements: Extraocular movements intact.     Conjunctiva/sclera: Conjunctivae normal.     Pupils: Pupils are equal, round, and reactive to light.  Cardiovascular:     Rate and Rhythm: Normal rate and regular rhythm.     Pulses: Normal pulses.     Heart sounds:  Normal heart sounds. No murmur heard. No friction rub. No gallop.   Pulmonary:     Effort: Pulmonary effort is normal. No respiratory distress.     Breath sounds: Normal breath sounds. No stridor. No wheezing, rhonchi or rales.  Chest:     Chest wall: No tenderness.  Breasts:     Right: Normal.     Musculoskeletal:        General: Normal range of motion.     Cervical back: Normal range of motion and neck supple.  Skin:    General: Skin is warm and dry.     Capillary Refill: Capillary refill takes less than 2 seconds.     Coloration: Skin is not jaundiced or pale.     Findings: No bruising, erythema, lesion or rash.  Neurological:     General: No focal  deficit present.     Mental Status: She is alert and oriented to person, place, and time. Mental status is at baseline.  Psychiatric:        Mood and Affect: Mood normal.        Behavior: Behavior normal.        Thought Content: Thought content normal.        Judgment: Judgment normal.     Results for orders placed or performed in visit on 06/27/20  CBC with Differential/Platelet  Result Value Ref Range   WBC 9.3 3.4 - 10.8 x10E3/uL   RBC 4.43 3.77 - 5.28 x10E6/uL   Hemoglobin 13.1 11.1 - 15.9 g/dL   Hematocrit 16.0 10.9 - 46.6 %   MCV 87 79 - 97 fL   MCH 29.6 26.6 - 33.0 pg   MCHC 34.2 31.5 - 35.7 g/dL   RDW 32.3 55.7 - 32.2 %   Platelets 394 150 - 450 x10E3/uL   Neutrophils 63 Not Estab. %   Lymphs 27 Not Estab. %   Monocytes 6 Not Estab. %   Eos 3 Not Estab. %   Basos 1 Not Estab. %   Neutrophils Absolute 5.9 1.4 - 7.0 x10E3/uL   Lymphocytes Absolute 2.5 0.7 - 3.1 x10E3/uL   Monocytes Absolute 0.6 0.1 - 0.9 x10E3/uL   EOS (ABSOLUTE) 0.2 0.0 - 0.4 x10E3/uL   Basophils Absolute 0.1 0.0 - 0.2 x10E3/uL   Immature Granulocytes 0 Not Estab. %   Immature Grans (Abs) 0.0 0.0 - 0.1 x10E3/uL  Comprehensive metabolic panel  Result Value Ref Range   Glucose 115 (H) 65 - 99 mg/dL   BUN 9 6 - 20 mg/dL   Creatinine, Ser  0.25 0.57 - 1.00 mg/dL   eGFR 427 >06 CB/JSE/8.31   BUN/Creatinine Ratio 15 9 - 23   Sodium 139 134 - 144 mmol/L   Potassium 3.7 3.5 - 5.2 mmol/L   Chloride 102 96 - 106 mmol/L   CO2 21 20 - 29 mmol/L   Calcium 9.5 8.7 - 10.2 mg/dL   Total Protein 6.7 6.0 - 8.5 g/dL   Albumin 4.5 3.8 - 4.8 g/dL   Globulin, Total 2.2 1.5 - 4.5 g/dL   Albumin/Globulin Ratio 2.0 1.2 - 2.2   Bilirubin Total 0.2 0.0 - 1.2 mg/dL   Alkaline Phosphatase 60 44 - 121 IU/L   AST 21 0 - 40 IU/L   ALT 17 0 - 32 IU/L  TSH  Result Value Ref Range   TSH 1.790 0.450 - 4.500 uIU/mL  Urinalysis, Routine w reflex microscopic  Result Value Ref Range   Specific Gravity, UA 1.015 1.005 - 1.030   pH, UA 7.0 5.0 - 7.5   Color, UA Yellow Yellow   Appearance Ur Clear Clear   Leukocytes,UA Negative Negative   Protein,UA Negative Negative/Trace   Glucose, UA Negative Negative   Ketones, UA Negative Negative   RBC, UA Negative Negative   Bilirubin, UA Negative Negative   Urobilinogen, Ur 0.2 0.2 - 1.0 mg/dL   Nitrite, UA Negative Negative  HIV antibody (with reflex)  Result Value Ref Range   HIV Screen 4th Generation wRfx Non Reactive Non Reactive      Assessment & Plan:   Problem List Items Addressed This Visit      Other   Severe recurrent major depression without psychotic features (HCC)    Under good control on current regimen. Continue current regimen. Continue to monitor. Call with any concerns. Refills given.  Relevant Medications   DULoxetine (CYMBALTA) 30 MG capsule   LORazepam (ATIVAN) 0.5 MG tablet   Attention deficit hyperactivity disorder (ADHD)    Under good control on current regimen. Continue current regimen. Continue to monitor. Call with any concerns. Refills given for 3 months. Follow up 3 months.        Anxiety    Under good control on current regimen. Continue current regimen. Continue to monitor. Call with any concerns. Refills given. 30 pills should last 6+ months. Call with  any concerns.         Relevant Medications   DULoxetine (CYMBALTA) 30 MG capsule   LORazepam (ATIVAN) 0.5 MG tablet   Controlled substance agreement signed    See scanned document. Signed 09/12/20       Other Visit Diagnoses    Breast lump on left side at 2 o'clock position    -  Primary   Will get her set up for mammogram/US. Await results. Treat as needed.    Relevant Orders   MM DIAG BREAST TOMO BILATERAL   MM DIAG BREAST TOMO UNI LEFT   US BREAST LTD UNI LEFT INC AXILLA   Upper respiratory tract infection, unspecified type       Will treat with prednisone. Call if not getting better or getting worse. Continue to monitor.        Follow up plan: Return before 9/5 ADD follow up.

## 2020-09-13 ENCOUNTER — Encounter: Payer: Self-pay | Admitting: Family Medicine

## 2020-09-13 NOTE — Assessment & Plan Note (Signed)
Under good control on current regimen. Continue current regimen. Continue to monitor. Call with any concerns. Refills given.   

## 2020-09-13 NOTE — Assessment & Plan Note (Signed)
Under good control on current regimen. Continue current regimen. Continue to monitor. Call with any concerns. Refills given for 3 months. Follow up 3 months.    

## 2020-09-13 NOTE — Assessment & Plan Note (Signed)
Under good control on current regimen. Continue current regimen. Continue to monitor. Call with any concerns. Refills given. 30 pills should last 6+ months. Call with any concerns.

## 2020-09-13 NOTE — Assessment & Plan Note (Signed)
See scanned document. Signed 09/12/20

## 2020-09-29 ENCOUNTER — Other Ambulatory Visit: Payer: Self-pay

## 2020-10-17 ENCOUNTER — Ambulatory Visit
Admission: RE | Admit: 2020-10-17 | Discharge: 2020-10-17 | Disposition: A | Discharge status: Home / Self Care | Payer: Self-pay | Source: Ambulatory Visit | Attending: Family Medicine | Admitting: Family Medicine

## 2020-10-17 ENCOUNTER — Other Ambulatory Visit: Payer: Self-pay | Admitting: Family Medicine

## 2020-10-17 ENCOUNTER — Other Ambulatory Visit: Payer: Self-pay

## 2020-10-17 DIAGNOSIS — N6310 Unspecified lump in the right breast, unspecified quadrant: Secondary | ICD-10-CM | POA: Insufficient documentation

## 2020-10-17 DIAGNOSIS — N6321 Unspecified lump in the left breast, upper outer quadrant: Secondary | ICD-10-CM | POA: Insufficient documentation

## 2020-10-18 ENCOUNTER — Other Ambulatory Visit: Payer: Self-pay | Admitting: Family Medicine

## 2020-10-18 DIAGNOSIS — N6310 Unspecified lump in the right breast, unspecified quadrant: Secondary | ICD-10-CM

## 2020-10-18 DIAGNOSIS — R928 Other abnormal and inconclusive findings on diagnostic imaging of breast: Secondary | ICD-10-CM

## 2020-10-25 ENCOUNTER — Ambulatory Visit
Admission: RE | Admit: 2020-10-25 | Discharge: 2020-10-25 | Disposition: A | Discharge status: Home / Self Care | Payer: Self-pay | Source: Ambulatory Visit | Attending: Family Medicine | Admitting: Family Medicine

## 2020-10-25 ENCOUNTER — Other Ambulatory Visit: Payer: Self-pay

## 2020-10-25 DIAGNOSIS — R928 Other abnormal and inconclusive findings on diagnostic imaging of breast: Secondary | ICD-10-CM

## 2020-10-25 DIAGNOSIS — N6310 Unspecified lump in the right breast, unspecified quadrant: Secondary | ICD-10-CM

## 2020-10-25 HISTORY — PX: BREAST BIOPSY: SHX20

## 2020-10-26 LAB — SURGICAL PATHOLOGY

## 2020-12-05 ENCOUNTER — Ambulatory Visit: Payer: Self-pay | Admitting: Family Medicine

## 2021-01-02 ENCOUNTER — Other Ambulatory Visit: Payer: Self-pay

## 2021-01-02 ENCOUNTER — Ambulatory Visit (INDEPENDENT_AMBULATORY_CARE_PROVIDER_SITE_OTHER): Payer: Self-pay | Admitting: Family Medicine

## 2021-01-02 ENCOUNTER — Encounter: Payer: Self-pay | Admitting: Family Medicine

## 2021-01-02 VITALS — BP 118/90 | HR 73 | Ht 61.5 in | Wt 140.0 lb

## 2021-01-02 DIAGNOSIS — F419 Anxiety disorder, unspecified: Secondary | ICD-10-CM

## 2021-01-02 DIAGNOSIS — F332 Major depressive disorder, recurrent severe without psychotic features: Secondary | ICD-10-CM

## 2021-01-02 DIAGNOSIS — F900 Attention-deficit hyperactivity disorder, predominantly inattentive type: Secondary | ICD-10-CM

## 2021-01-02 MED ORDER — LORAZEPAM 0.5 MG PO TABS: 0.5000 mg | ORAL_TABLET | ORAL | 0 refills | Status: DC | PRN

## 2021-01-02 MED ORDER — AMPHETAMINE-DEXTROAMPHETAMINE 10 MG PO TABS
10.0000 mg | ORAL_TABLET | Freq: Two times a day (BID) | ORAL | 0 refills | Status: DC
Start: 2021-01-02 — End: 2021-04-05

## 2021-01-02 MED ORDER — AMPHETAMINE-DEXTROAMPHETAMINE 10 MG PO TABS: 10.0000 mg | ORAL_TABLET | Freq: Two times a day (BID) | ORAL | 0 refills | Status: DC

## 2021-01-02 MED ORDER — DULOXETINE HCL 40 MG PO CPEP: 40.0000 mg | ORAL_CAPSULE | Freq: Every day | ORAL | 2 refills | Status: DC

## 2021-01-02 NOTE — Progress Notes (Signed)
BP 118/90   Pulse 73   Ht 5' 1.5" (1.562 m)   Wt 140 lb (63.5 kg)   LMP 04/24/2015   BMI 26.02 kg/m    Subjective:    Patient ID: Karen Marsh, female    DOB: 1980/11/07, 40 y.o.   MRN: [id number]  HPI: Karen Marsh is a 40 y.o. female  Chief Complaint  Patient presents with   Anxiety   ADHD   ANXIETY/DEPRESSION- doing well with the depression, but having issues with her kid, has been needing her lorazepam about 3-4x a week  Duration: chronic Status:exacerbated Anxious mood: yes  Excessive worrying: yes Irritability: yes  Sweating: no Nausea: no Palpitations:no Hyperventilation: no Panic attacks: no Agoraphobia: no  Obscessions/compulsions: no Depressed mood: no Depression screen [facility] 2/9 01/02/2021 09/12/2020 06/27/2020 07/07/2019 03/26/2019  Decreased Interest 0 0 0 0 0  Down, Depressed, Hopeless 0 0 0 0 1  PHQ - 2 Score 0 0 0 0 1  Altered sleeping - 0 0 0 1  Tired, decreased energy - 1 0 2 1  Change in appetite - 0 0 0 1  Feeling bad or failure about yourself  - 0 0 0 0  Trouble concentrating - 1 0 1 1  Moving slowly or fidgety/restless - 0 0 0 0  Suicidal thoughts - 0 0 0 0  PHQ-9 Score - 2 0 3 5  Difficult doing work/chores - Not difficult at all - Somewhat difficult Somewhat difficult   Anhedonia: no Weight changes: no Insomnia: no   Hypersomnia: no Fatigue/loss of energy: yes Feelings of worthlessness: no Feelings of guilt: no Impaired concentration/indecisiveness: no Suicidal ideations: no  Crying spells: no Recent Stressors/Life Changes: yes   Relationship problems: no   Family stress: yes     Financial stress: no    Job stress: no    Recent death/loss: no  ADHD FOLLOW UP ADHD status: stable Satisfied with current therapy: yes Medication compliance:  excellent compliance Controlled substance contract: yes Previous psychiatry evaluation: no Previous medications: no    Taking meds on weekends/vacations: yes Work/school performance:   good Difficulty sustaining attention/completing tasks: no Distracted by extraneous stimuli: no Does not listen when spoken to: no  Fidgets with hands or feet: no Unable to stay in seat: no Blurts out/interrupts others: no ADHD Medication Side Effects: no    Decreased appetite: no    Headache: no    Sleeping disturbance pattern: no    Irritability: no    Rebound effects (worse than baseline) off medication: no    Anxiousness: no    Dizziness: no    Tics: no  Relevant past medical, surgical, family and social history reviewed and updated as indicated. Interim medical history since our last visit reviewed. Allergies and medications reviewed and updated.  Review of Systems  Constitutional: Negative.   Respiratory: Negative.    Cardiovascular: Negative.   Gastrointestinal: Negative.   Musculoskeletal: Negative.   Psychiatric/Behavioral:  Positive for decreased concentration. Negative for agitation, behavioral problems, confusion, dysphoric mood, hallucinations, self-injury, sleep disturbance and suicidal ideas. The patient is nervous/anxious. The patient is not hyperactive.    Per HPI unless specifically indicated above     Objective:    BP 118/90   Pulse 73   Ht 5' 1.5" (1.562 m)   Wt 140 lb (63.5 kg)   LMP 04/24/2015   BMI 26.02 kg/m   Wt Readings from Last 3 Encounters:  01/02/21 140 lb (63.5 kg)  09/12/20 138 lb  12.8 oz (63 kg)  06/27/20 140 lb 9.6 oz (63.8 kg)    Physical Exam Vitals and nursing note reviewed.  Constitutional:      General: She is not in acute distress.    Appearance: Normal appearance. She is not ill-appearing, toxic-appearing or diaphoretic.  HENT:     Head: Normocephalic and atraumatic.     Right Ear: External ear normal.     Left Ear: External ear normal.     Nose: Nose normal.     Mouth/Throat:     Mouth: Mucous membranes are moist.     Pharynx: Oropharynx is clear.  Eyes:     General: No scleral icterus.       Right eye: No discharge.         Left eye: No discharge.     Extraocular Movements: Extraocular movements intact.     Conjunctiva/sclera: Conjunctivae normal.     Pupils: Pupils are equal, round, and reactive to light.  Cardiovascular:     Rate and Rhythm: Normal rate and regular rhythm.     Pulses: Normal pulses.     Heart sounds: Normal heart sounds. No murmur heard.   No friction rub. No gallop.  Pulmonary:     Effort: Pulmonary effort is normal. No respiratory distress.     Breath sounds: Normal breath sounds. No stridor. No wheezing, rhonchi or rales.  Chest:     Chest wall: No tenderness.  Musculoskeletal:        General: Normal range of motion.     Cervical back: Normal range of motion and neck supple.  Skin:    General: Skin is warm and dry.     Capillary Refill: Capillary refill takes less than 2 seconds.     Coloration: Skin is not jaundiced or pale.     Findings: No bruising, erythema, lesion or rash.  Neurological:     General: No focal deficit present.     Mental Status: She is alert and oriented to person, place, and time. Mental status is at baseline.  Psychiatric:        Mood and Affect: Mood normal.        Behavior: Behavior normal.        Thought Content: Thought content normal.        Judgment: Judgment normal.    Results for orders placed or performed during the hospital encounter of 10/25/20  Surgical pathology  Result Value Ref Range   SURGICAL PATHOLOGY      SURGICAL PATHOLOGY CASE: [id number] PATIENT: [facility] Surgical Pathology Report     Specimen Submitted: A. Breast mass, right 11:00, 3 cmfn  Clinical History: 1.9 cm right breast. 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 Heart shaped biopsy marker deployed.      DIAGNOSIS: A. BREAST, RIGHT, 11:00, 3 CM FROM NIPPLE, MASS; CORE BIOPSY: - FIBROADENOMA.   GROSS DESCRIPTION: A. Labeled: Right breast 11:00 3 cm nipple Received: Formalin Time/date in fixative: Collected and placed in formalin at 9:15 AM  on 10/25/2020 Cold ischemic time: Less than 1 minute Total fixation time: Approximately 10 hours Core pieces: 4 cores Size: Range from 1.4-1.9 cm in length and 0.2 cm in diameter Description: Received are cores of yellow and white fibrofatty tissue. Ink color: Green Entirely submitted in cassettes 1-2 with 2 cores per cassette.   RB 10/25/2020  Final Diagnosis performed by Redmond Pulling, MD.   Electronically signed 10/26/2020 10:38:20AM The electronic s Tobey Grim indicates that the named Attending Pathologist has evaluated the specimen  Technical component performed at [facility], [address] [place], [place] [address] Lab: [phone] Dir: Jolene Schimke, MD, MMM  Professional component performed at [facility], [facility], [address] [place], [place], [place] [address] Lab: [phone] Dir: Georgiann Cocker. Oneita Kras, MD       Assessment & Plan:   Problem List Items Addressed This Visit       Other   Severe recurrent major depression without psychotic features (HCC)    Improved. Anxiety still acting up. Will increase her cymbalta and recheck 3 months.       Relevant Medications   DULoxetine 40 MG CPEP   LORazepam (ATIVAN) 0.5 MG tablet   Attention deficit hyperactivity disorder (ADHD)    Under good control on current regimen. Continue current regimen. Continue to monitor. Call with any concerns. Refills given for 3 months, follow up 3 months.        Anxiety - Primary    Anxiety exacerbated. Will increase her cymbalta to 40mg . 45 lorazepam for 3 months. Call with any concerns. Continue to monitor.       Relevant Medications   DULoxetine 40 MG CPEP   LORazepam (ATIVAN) 0.5 MG tablet     Follow up plan: Return in about 3 months (around 04/03/2021) for follow up mood/add.

## 2021-01-02 NOTE — Assessment & Plan Note (Signed)
Under good control on current regimen. Continue current regimen. Continue to monitor. Call with any concerns. Refills given for 3 months, follow up 3 months.   

## 2021-01-02 NOTE — Assessment & Plan Note (Signed)
Anxiety exacerbated. Will increase her cymbalta to 40mg . 45 lorazepam for 3 months. Call with any concerns. Continue to monitor.

## 2021-01-02 NOTE — Assessment & Plan Note (Signed)
Improved. Anxiety still acting up. Will increase her cymbalta and recheck 3 months.

## 2021-01-11 ENCOUNTER — Other Ambulatory Visit: Payer: Self-pay

## 2021-02-16 ENCOUNTER — Other Ambulatory Visit: Payer: Self-pay | Admitting: Family Medicine

## 2021-02-16 NOTE — Telephone Encounter (Signed)
Requested medication (s) are due for refill today: Yes  Requested medication (s) are on the active medication list: Yes  Last refill:  12/30/20  Future visit scheduled: Yes  Notes to clinic:  Unable to refill per protocol, cannot delegate.      Requested Prescriptions  Pending Prescriptions Disp Refills   LORazepam (ATIVAN) 0.5 MG tablet [Pharmacy Med Name: LORAZEPAM 0.5 MG TABLET] 45 tablet     Sig: TAKE 1 TABLET BY MOUTH AS NEEDED FOR ANXIETY     Not Delegated - Psychiatry:  Anxiolytics/Hypnotics Failed - 02/16/2021 11:54 AM      Failed - This refill cannot be delegated      Failed - Urine Drug Screen completed in last 360 days      Passed - Valid encounter within last 6 months    Recent Outpatient Visits           1 month ago [place], Megan P, DO   5 months ago Breast lump on left side at 2 o'clock position   [facility], Megan P, DO   7 months ago Depression, recurrent ([place])   [place], [place]   1 year ago Attention deficit hyperactivity disorder (ADHD), predominantly inattentive type   [facility], Lilia Argue, [place]   1 year ago Attention deficit hyperactivity disorder (ADHD), predominantly inattentive type   [facility], Lilia Argue, [place]       Future Appointments             In 1 month Wynetta Emery, Barb Merino, DO [employer], [facility]

## 2021-03-30 ENCOUNTER — Other Ambulatory Visit: Payer: Self-pay | Admitting: Family Medicine

## 2021-03-30 NOTE — Telephone Encounter (Signed)
Requested medication (s) are due for refill today: yes  Requested medication (s) are on the active medication list:yes Last refill:01/02/21  Future visit scheduled: yes  Notes to clinic:  This refill cannot be delegated      Requested Prescriptions  Pending Prescriptions Disp Refills   LORazepam (ATIVAN) 0.5 MG tablet [Pharmacy Med Name: LORAZEPAM 0.5 MG TABLET] 45 tablet     Sig: TAKE 1 TABLET BY MOUTH AS NEEDED FOR ANXIETY     Not Delegated - Psychiatry:  Anxiolytics/Hypnotics Failed - 03/30/2021  1:13 PM      Failed - This refill cannot be delegated      Failed - Urine Drug Screen completed in last 360 days      Passed - Valid encounter within last 6 months    Recent Outpatient Visits           2 months ago [place], Megan P, DO   6 months ago Breast lump on left side at 2 o'clock position   [facility], Megan P, DO   9 months ago Depression, recurrent ([place])   [place], MD   1 year ago Attention deficit hyperactivity disorder (ADHD), predominantly inattentive type   [facility], Lilia Argue, [place]   1 year ago Attention deficit hyperactivity disorder (ADHD), predominantly inattentive type   [facility], Lilia Argue, [place]       Future Appointments             In 6 days Wynetta Emery, Barb Merino, DO [employer], [facility]

## 2021-03-30 NOTE — Telephone Encounter (Signed)
Requested medication (s) are due for refill today: yes  Requested medication (s) are on the active medication list: yes    Last refill: 01/02/21  #45  0 refills  Future visit scheduled yes 04/05/21  Notes to clinic:Not delegated  Requested Prescriptions  Pending Prescriptions Disp Refills   LORazepam (ATIVAN) 0.5 MG tablet [Pharmacy Med Name: LORAZEPAM 0.5 MG TABLET] 45 tablet     Sig: TAKE 1 TABLET BY MOUTH AS NEEDED FOR ANXIETY     Not Delegated - Psychiatry:  Anxiolytics/Hypnotics Failed - 03/30/2021  1:13 PM      Failed - This refill cannot be delegated      Failed - Urine Drug Screen completed in last 360 days      Passed - Valid encounter within last 6 months    Recent Outpatient Visits           2 months ago [place], Megan P, DO   6 months ago Breast lump on left side at 2 o'clock position   [facility], Megan P, DO   9 months ago Depression, recurrent ([place])   [place], MD   1 year ago Attention deficit hyperactivity disorder (ADHD), predominantly inattentive type   [facility], Lilia Argue, [place]   1 year ago Attention deficit hyperactivity disorder (ADHD), predominantly inattentive type   [facility], Lilia Argue, [place]       Future Appointments             In 6 days Wynetta Emery, Barb Merino, DO [employer], [facility]

## 2021-04-05 ENCOUNTER — Encounter: Payer: Self-pay | Admitting: Family Medicine

## 2021-04-05 ENCOUNTER — Ambulatory Visit (INDEPENDENT_AMBULATORY_CARE_PROVIDER_SITE_OTHER): Payer: Self-pay | Admitting: Family Medicine

## 2021-04-05 ENCOUNTER — Other Ambulatory Visit: Payer: Self-pay

## 2021-04-05 VITALS — BP 107/75 | HR 85 | Temp 98.2°F | Ht 61.5 in | Wt 128.8 lb

## 2021-04-05 DIAGNOSIS — R61 Generalized hyperhidrosis: Secondary | ICD-10-CM

## 2021-04-05 DIAGNOSIS — F332 Major depressive disorder, recurrent severe without psychotic features: Secondary | ICD-10-CM

## 2021-04-05 DIAGNOSIS — F419 Anxiety disorder, unspecified: Secondary | ICD-10-CM

## 2021-04-05 DIAGNOSIS — F900 Attention-deficit hyperactivity disorder, predominantly inattentive type: Secondary | ICD-10-CM

## 2021-04-05 MED ORDER — LORAZEPAM 0.5 MG PO TABS: 0.5000 mg | ORAL_TABLET | ORAL | 0 refills | Status: DC | PRN

## 2021-04-05 MED ORDER — DULOXETINE HCL 40 MG PO CPEP: 40.0000 mg | ORAL_CAPSULE | Freq: Every day | ORAL | 2 refills | Status: DC

## 2021-04-05 MED ORDER — AMPHETAMINE-DEXTROAMPHETAMINE 10 MG PO TABS
10.0000 mg | ORAL_TABLET | Freq: Two times a day (BID) | ORAL | 0 refills | Status: DC
Start: 2021-06-04 — End: 2021-07-17

## 2021-04-05 MED ORDER — AMPHETAMINE-DEXTROAMPHETAMINE 10 MG PO TABS: 10.0000 mg | ORAL_TABLET | Freq: Two times a day (BID) | ORAL | 0 refills | Status: DC

## 2021-04-05 MED ORDER — AMPHETAMINE-DEXTROAMPHETAMINE 10 MG PO TABS
10.0000 mg | ORAL_TABLET | Freq: Two times a day (BID) | ORAL | 0 refills | Status: DC
Start: 2021-05-05 — End: 2021-07-17

## 2021-04-05 NOTE — Progress Notes (Signed)
BP 107/75    Pulse 85    Temp 98.2 F (36.8 C)    Ht 5' 1.5" (1.562 m)    Wt 128 lb 12.8 oz (58.4 kg)    LMP 04/24/2015    SpO2 98%    BMI 23.94 kg/m    Subjective:    Patient ID: Karen Marsh, female    DOB: 11/10/1980, 40 y.o.   MRN: [id number]  HPI: Karen Marsh is a 40 y.o. female  Chief Complaint  Patient presents with   Depression   Anxiety   ADHD   Night Sweats    Patient states she think she is going through pre-menopause. Patient has night sweats certain nights of the month    ANXIETY/DEPRESSION Duration: chronic Status:stable Anxious mood: yes  Excessive worrying: yes Irritability: no  Sweating: yes Nausea: no Palpitations:no Hyperventilation: no Panic attacks: no Agoraphobia: no  Obscessions/compulsions: no Depressed mood: no Depression screen [facility] 2/9 04/05/2021 01/02/2021 09/12/2020 06/27/2020 07/07/2019  Decreased Interest 0 0 0 0 0  Down, Depressed, Hopeless 0 0 0 0 0  PHQ - 2 Score 0 0 0 0 0  Altered sleeping 0 - 0 0 0  Tired, decreased energy 1 - 1 0 2  Change in appetite 1 - 0 0 0  Feeling bad or failure about yourself  0 - 0 0 0  Trouble concentrating 1 - 1 0 1  Moving slowly or fidgety/restless 1 - 0 0 0  Suicidal thoughts 0 - 0 0 0  PHQ-9 Score 4 - 2 0 3  Difficult doing work/chores - - Not difficult at all - Somewhat difficult   Anhedonia: no Weight changes: yes Insomnia: no   Hypersomnia: no Fatigue/loss of energy: yes Feelings of worthlessness: no Feelings of guilt: no Impaired concentration/indecisiveness: no Suicidal ideations: no  Crying spells: no Recent Stressors/Life Changes: yes   Relationship problems: no   Family stress: no     Financial stress: no    Job stress: yes    Recent death/loss: no  ADHD FOLLOW UP ADHD status: controlled Satisfied with current therapy: yes Medication compliance:  excellent compliance Controlled substance contract: yes Previous psychiatry evaluation: no Previous medications: yes    Taking  meds on weekends/vacations: yes Work/school performance:  good Difficulty sustaining attention/completing tasks: no Distracted by extraneous stimuli: no Does not listen when spoken to: no  Fidgets with hands or feet: no Unable to stay in seat: no Blurts out/interrupts others: no ADHD Medication Side Effects: no    Decreased appetite: no    Headache: no    Sleeping disturbance pattern: no    Irritability: no    Rebound effects (worse than baseline) off medication: no    Anxiousness: no    Dizziness: no    Tics: no  Relevant past medical, surgical, family and social history reviewed and updated as indicated. Interim medical history since our last visit reviewed. Allergies and medications reviewed and updated.  Review of Systems  Constitutional:  Positive for diaphoresis. Negative for activity change, appetite change, chills, fatigue, fever and unexpected weight change.  Respiratory: Negative.    Cardiovascular: Negative.   Musculoskeletal: Negative.   Neurological: Negative.   Psychiatric/Behavioral: Negative.     Per HPI unless specifically indicated above     Objective:    BP 107/75    Pulse 85    Temp 98.2 F (36.8 C)    Ht 5' 1.5" (1.562 m)    Wt 128 lb 12.8 oz (58.4  kg)    LMP 04/24/2015    SpO2 98%    BMI 23.94 kg/m   Wt Readings from Last 3 Encounters:  04/05/21 128 lb 12.8 oz (58.4 kg)  01/02/21 140 lb (63.5 kg)  09/12/20 138 lb 12.8 oz (63 kg)    Physical Exam Vitals and nursing note reviewed.  Constitutional:      General: She is not in acute distress.    Appearance: Normal appearance. She is not ill-appearing, toxic-appearing or diaphoretic.  HENT:     Head: Normocephalic and atraumatic.     Right Ear: External ear normal.     Left Ear: External ear normal.     Nose: Nose normal.     Mouth/Throat:     Mouth: Mucous membranes are moist.     Pharynx: Oropharynx is clear.  Eyes:     General: No scleral icterus.       Right eye: No discharge.        Left  eye: No discharge.     Extraocular Movements: Extraocular movements intact.     Conjunctiva/sclera: Conjunctivae normal.     Pupils: Pupils are equal, round, and reactive to light.  Cardiovascular:     Rate and Rhythm: Normal rate and regular rhythm.     Pulses: Normal pulses.     Heart sounds: Normal heart sounds. No murmur heard.   No friction rub. No gallop.  Pulmonary:     Effort: Pulmonary effort is normal. No respiratory distress.     Breath sounds: Normal breath sounds. No stridor. No wheezing, rhonchi or rales.  Chest:     Chest wall: No tenderness.  Musculoskeletal:        General: Normal range of motion.     Cervical back: Normal range of motion and neck supple.  Skin:    General: Skin is warm and dry.     Capillary Refill: Capillary refill takes less than 2 seconds.     Coloration: Skin is not jaundiced or pale.     Findings: No bruising, erythema, lesion or rash.  Neurological:     General: No focal deficit present.     Mental Status: She is alert and oriented to person, place, and time. Mental status is at baseline.  Psychiatric:        Mood and Affect: Mood normal.        Behavior: Behavior normal.        Thought Content: Thought content normal.        Judgment: Judgment normal.    Results for orders placed or performed during the hospital encounter of 10/25/20  Surgical pathology  Result Value Ref Range   SURGICAL PATHOLOGY      SURGICAL PATHOLOGY CASE: [id number] PATIENT: [facility] Surgical Pathology Report     Specimen Submitted: A. Breast mass, right 11:00, 3 cmfn  Clinical History: 1.9 cm right breast. 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 Heart shaped biopsy marker deployed.      DIAGNOSIS: A. BREAST, RIGHT, 11:00, 3 CM FROM NIPPLE, MASS; CORE BIOPSY: - FIBROADENOMA.   GROSS DESCRIPTION: A. Labeled: Right breast 11:00 3 cm nipple Received: Formalin Time/date in fixative: Collected and placed in formalin at 9:15 AM  on 10/25/2020 Cold ischemic time: Less than 1 minute Total fixation time: Approximately 10 hours Core pieces: 4 cores Size: Range from 1.4-1.9 cm in length and 0.2 cm in diameter Description: Received are cores of yellow and white fibrofatty tissue. Ink color: Green Entirely submitted in cassettes 1-2 with 2  cores per cassette.   RB 10/25/2020  Final Diagnosis performed by Redmond Pulling, MD.   Electronically signed 10/26/2020 10:38:20AM The electronic s ignature indicates that the named Attending Pathologist has evaluated the specimen Technical component performed at [facility], [address] [place], [place] [address] Lab: [phone] Dir: Jolene Schimke, MD, MMM  Professional component performed at [facility], [facility] [address] [place], [place], [place] [address] Lab: [phone] Dir: Georgiann Cocker. Rubinas, MD       Assessment & Plan:   Problem List Items Addressed This Visit       Other   Severe recurrent major depression without psychotic features (HCC)    Under good control on current regimen. Continue current regimen. Continue to monitor. Call with any concerns. Refills given.        Relevant Medications   DULoxetine HCl 40 MG CPEP   LORazepam (ATIVAN) 0.5 MG tablet   Attention deficit hyperactivity disorder (ADHD) - Primary    Under good control on current regimen. Continue current regimen. Continue to monitor. Call with any concerns. Refills given for 3 months. Follow up 3 months.        Anxiety    Under good control on current regimen. Continue current regimen. Continue to monitor. Call with any concerns. Refills given. 45 pills should last for 3 months. Follow up 3 months.        Relevant Medications   DULoxetine HCl 40 MG CPEP   LORazepam (ATIVAN) 0.5 MG tablet   Other Visit Diagnoses     Night sweats       Likely med related. Continue to monitor. Call if getting worse and we can check labs.         Follow up plan: Return in about 3  months (around 07/04/2021).

## 2021-04-05 NOTE — Assessment & Plan Note (Signed)
Under good control on current regimen. Continue current regimen. Continue to monitor. Call with any concerns. Refills given for 3 months. Follow up 3 months.    

## 2021-04-05 NOTE — Assessment & Plan Note (Signed)
Under good control on current regimen. Continue current regimen. Continue to monitor. Call with any concerns. Refills given.   

## 2021-04-05 NOTE — Assessment & Plan Note (Signed)
Under good control on current regimen. Continue current regimen. Continue to monitor. Call with any concerns. Refills given. 45 pills should last for 3 months. Follow up 3 months.

## 2021-06-07 ENCOUNTER — Other Ambulatory Visit: Payer: Self-pay | Admitting: Family Medicine

## 2021-06-07 NOTE — Telephone Encounter (Signed)
Requested medication (s) are due for refill today: yes  ? ?Requested medication (s) are on the active medication list: yes ? ?Last refill:  04/05/21 #45 0 refill ? ?Future visit scheduled: yes in 1 month ? ?Notes to clinic:  not delegated per protocol .  ? ? ?  ?Requested Prescriptions  ?Pending Prescriptions Disp Refills  ? LORazepam (ATIVAN) 0.5 MG tablet [Pharmacy Med Name: LORazepam 0.5 MG TABLET] 45 tablet   ?  Sig: TAKE ONE TABLET BY MOUTH AS NEEDED FOR ANXIETY  ?  ? Not Delegated - Psychiatry: Anxiolytics/Hypnotics 2 Failed - 06/07/2021 12:32 PM  ?  ?  Failed - This refill cannot be delegated  ?  ?  Failed - Urine Drug Screen completed in last 360 days  ?  ?  Passed - Patient is not pregnant  ?  ?  Passed - Valid encounter within last 6 months  ?  Recent Outpatient Visits   ? ?      ? 2 months ago Attention deficit hyperactivity disorder (ADHD), predominantly inattentive type  ? [place], DO  ? 5 months ago Anxiety  ? [place], Megan P, DO  ? 8 months ago Breast lump on left side at 2 o'clock position  ? [place], DO  ? 11 months ago Depression, recurrent ([place])  ? [facility] V[place], [place], MD  ? 1 year ago Attention deficit hyperactivity disorder (ADHD), predominantly inattentive type  ? [place], [place], [place]  ? ?  ?  ?Future Appointments   ? ?        ? In 1 month Johnson, Barb Merino, DO [facility]  ? ?  ? ?  ?  ?  ? ?

## 2021-07-17 ENCOUNTER — Encounter: Payer: Self-pay | Admitting: Family Medicine

## 2021-07-17 ENCOUNTER — Ambulatory Visit (INDEPENDENT_AMBULATORY_CARE_PROVIDER_SITE_OTHER): Payer: Self-pay | Admitting: Family Medicine

## 2021-07-17 VITALS — BP 121/87 | HR 81 | Temp 98.0°F | Wt 128.6 lb

## 2021-07-17 DIAGNOSIS — F900 Attention-deficit hyperactivity disorder, predominantly inattentive type: Secondary | ICD-10-CM

## 2021-07-17 DIAGNOSIS — M25511 Pain in right shoulder: Secondary | ICD-10-CM

## 2021-07-17 DIAGNOSIS — B029 Zoster without complications: Secondary | ICD-10-CM

## 2021-07-17 DIAGNOSIS — F419 Anxiety disorder, unspecified: Secondary | ICD-10-CM

## 2021-07-17 DIAGNOSIS — F332 Major depressive disorder, recurrent severe without psychotic features: Secondary | ICD-10-CM

## 2021-07-17 MED ORDER — VALACYCLOVIR HCL 1 G PO TABS: 1000.0000 mg | ORAL_TABLET | Freq: Two times a day (BID) | ORAL | 0 refills | Status: DC

## 2021-07-17 MED ORDER — AMPHETAMINE-DEXTROAMPHETAMINE 10 MG PO TABS: 10.0000 mg | ORAL_TABLET | Freq: Two times a day (BID) | ORAL | 0 refills | Status: DC

## 2021-07-17 MED ORDER — TRIAMCINOLONE ACETONIDE 0.5 % EX OINT: 1.0000 "application " | TOPICAL_OINTMENT | Freq: Two times a day (BID) | CUTANEOUS | 0 refills | Status: DC

## 2021-07-17 MED ORDER — LORAZEPAM 0.5 MG PO TABS: 0.5000 mg | ORAL_TABLET | ORAL | 0 refills | Status: DC | PRN

## 2021-07-17 MED ORDER — ALBUTEROL SULFATE HFA 108 (90 BASE) MCG/ACT IN AERS: 2.0000 | INHALATION_SPRAY | RESPIRATORY_TRACT | 4 refills | Status: DC | PRN

## 2021-07-17 MED ORDER — DULOXETINE HCL 40 MG PO CPEP: 40.0000 mg | ORAL_CAPSULE | Freq: Every day | ORAL | 1 refills | Status: DC

## 2021-07-17 NOTE — Progress Notes (Addendum)
? ?BP 121/87   Pulse 81   Temp 98 ?F (36.7 ?C)   Wt 128 lb 9.6 oz (58.3 kg)   LMP 04/24/2015   SpO2 98%   BMI 23.91 kg/m?   ? ?Subjective:  ? ? Patient ID: Karen Marsh, female    DOB: [birth year], 41 y.o.   MRN: [id number] ? ?HPI: ?Karen Marsh is a 41 y.o. female ? ?Chief Complaint  ?Patient presents with  ? ADHD  ? Anxiety  ? Depression  ? Rash  ?  Patient states she has an area on her right side that looks itchy, red, and scaly. Patient states she had the same thing in 2000 and was told it was shingles.   ? ?ADHD FOLLOW UP ?ADHD status: controlled ?Satisfied with current therapy: yes ?Medication compliance:  excellent compliance ?Controlled substance contract: yes ?Previous psychiatry evaluation: no ?Previous medications: yes    ?Taking meds on weekends/vacations: yes ?Work/school performance:  good ?Difficulty sustaining attention/completing tasks: no ?Distracted by extraneous stimuli: no ?Does not listen when spoken to: no  ?Fidgets with hands or feet: no ?Unable to stay in seat: no ?Blurts out/interrupts others: no ?ADHD Medication Side Effects: no ?   Decreased appetite: no ?   Headache: no ?   Sleeping disturbance pattern: no ?   Irritability: no ?   Rebound effects (worse than baseline) off medication: no ?   Anxiousness: no ?   Dizziness: no ?   Tics: no ? ?ANXIETY/DEPRESSION- has had a lot of stressors recently ?Duration: chronic ?Status:stable ?Anxious mood: yes  ?Excessive worrying: yes ?Irritability: no  ?Sweating: no ?Nausea: no ?Palpitations:no ?Hyperventilation: no ?Panic attacks: yes ?Agoraphobia: no  ?Obscessions/compulsions: no ?Depressed mood: yes ? ?  07/17/2021  ? 10:21 AM 04/05/2021  ?  9:55 AM 01/02/2021  ? 10:26 AM 09/12/2020  ?  4:10 PM 06/27/2020  ?  3:50 PM  ?Depression screen PHQ 2/9  ?Decreased Interest 0 0 0 0 0  ?Down, Depressed, Hopeless 0 0 0 0 0  ?PHQ - 2 Score 0 0 0 0 0  ?Altered sleeping 1 0  0 0  ?Tired, decreased energy 2 1  1  0  ?Change in appetite 1 1  0 0  ?Feeling bad  or failure about yourself  0 0  0 0  ?Trouble concentrating 2 1  1  0  ?Moving slowly or fidgety/restless 0 1  0 0  ?Suicidal thoughts 0 0  0 0  ?PHQ-9 Score 6 4  2  0  ?Difficult doing work/chores    Not difficult at all   ? ? ?  07/17/2021  ? 12:10 PM 04/05/2021  ?  9:55 AM 01/02/2021  ? 10:26 AM 09/12/2020  ?  4:10 PM  ?GAD 7 : Generalized Anxiety Score  ?Nervous, Anxious, on Edge 2 1 3 2   ?Control/stop worrying 1 0 2 1  ?Worry too much - different things 1 0 3 1  ?Trouble relaxing 2 1 3 2   ?Restless 2 2 3 2   ?Easily annoyed or irritable 2 2 3 2   ?Afraid - awful might happen 1 0 2 0  ?Total GAD 7 Score 11 6 19 10   ?Anxiety Difficulty Somewhat difficult Somewhat difficult  Somewhat difficult  ? ?Anhedonia: no ?Weight changes: no ?Insomnia: no   ?Hypersomnia: no ?Fatigue/loss of energy: no ?Feelings of worthlessness: no ?Feelings of guilt: no ?Impaired concentration/indecisiveness: no ?Suicidal ideations: no  ?Crying spells: no ?Recent Stressors/Life Changes: no ?  Relationship problems: no ?  Family stress: no   ?  Financial stress: no  ?  Job stress: no  ?  Recent death/loss: no ? ?RASH ?Duration:   about 2 months   ?Location: back  ?Itching: yes ?Burning: yes ?Redness: no ?Oozing: no ?Scaling: no ?Blisters: no ?Painful: no ?Fevers: no ?Change in detergents/soaps/personal care products: no ?Recent illness: no ?Recent travel:no ?History of same: yes ?Context: better ?Alleviating factors: nothing ?Treatments attempted:nothing ?Shortness of breath: no  ?Throat/tongue swelling: no ?Myalgias/arthralgias: no ? ?SHOULDER PAIN ?Duration: 10 days ?Involved shoulder: R shoulder ?Mechanism of injury: fall on concrete ?Location: diffuse ?Onset:sudden ?Severity: moderate  ?Quality:  sore ?Frequency: constant ?Radiation: no ?Aggravating factors: movement  ?Alleviating factors: rest  ?Status: stable ?Treatments attempted: rest, ice, heat, APAP, and ibuprofen  ?Relief with NSAIDs?:  mild ?Weakness: no ?Numbness: no ?Decreased grip  strength: no ?Redness: no ?Swelling: no ?Bruising: no ?Fevers: no ? ? ?Relevant past medical, surgical, family and social history reviewed and updated as indicated. Interim medical history since our last visit reviewed. ?Allergies and medications reviewed and updated. ? ?Review of Systems  ?Constitutional: Negative.   ?Respiratory: Negative.    ?Cardiovascular: Negative.   ?Gastrointestinal: Negative.   ?Musculoskeletal:  Positive for arthralgias and myalgias. Negative for back pain, gait problem, joint swelling, neck pain and neck stiffness.  ?Skin: Negative.   ?Neurological: Negative.   ?Psychiatric/Behavioral:  Positive for dysphoric mood. Negative for agitation, behavioral problems, confusion, decreased concentration, hallucinations, self-injury, sleep disturbance and suicidal ideas. The patient is nervous/anxious. The patient is not hyperactive.   ? ?Per HPI unless specifically indicated above ? ?   ?Objective:  ?  ?BP 121/87   Pulse 81   Temp 98 ?F (36.7 ?C)   Wt 128 lb 9.6 oz (58.3 kg)   LMP 04/24/2015   SpO2 98%   BMI 23.91 kg/m?   ?Wt Readings from Last 3 Encounters:  ?07/17/21 128 lb 9.6 oz (58.3 kg)  ?04/05/21 128 lb 12.8 oz (58.4 kg)  ?01/02/21 140 lb (63.5 kg)  ?  ?Physical Exam ?Vitals and nursing note reviewed.  ?Constitutional:   ?   General: She is not in acute distress. ?   Appearance: Normal appearance. She is not ill-appearing, toxic-appearing or diaphoretic.  ?HENT:  ?   Head: Normocephalic and atraumatic.  ?   Right Ear: External ear normal.  ?   Left Ear: External ear normal.  ?   Nose: Nose normal.  ?   Mouth/Throat:  ?   Mouth: Mucous membranes are moist.  ?   Pharynx: Oropharynx is clear.  ?Eyes:  ?   General: No scleral icterus.    ?   Right eye: No discharge.     ?   Left eye: No discharge.  ?   Extraocular Movements: Extraocular movements intact.  ?   Conjunctiva/sclera: Conjunctivae normal.  ?   Pupils: Pupils are equal, round, and reactive to light.  ?Cardiovascular:  ?   Rate  and Rhythm: Normal rate and regular rhythm.  ?   Pulses: Normal pulses.  ?   Heart sounds: Normal heart sounds. No murmur heard. ?  No friction rub. No gallop.  ?Pulmonary:  ?   Effort: Pulmonary effort is normal. No respiratory distress.  ?   Breath sounds: Normal breath sounds. No stridor. No wheezing, rhonchi or rales.  ?Chest:  ?   Chest wall: No tenderness.  ?Musculoskeletal:     ?   General: Tenderness present. Normal range of motion.  ?  Cervical back: Normal range of motion and neck supple.  ?Skin: ?   General: Skin is warm and dry.  ?   Capillary Refill: Capillary refill takes less than 2 seconds.  ?   Coloration: Skin is not jaundiced or pale.  ?   Findings: Rash (flesh colored papules on mid back on the R) present. No bruising, erythema or lesion.  ?Neurological:  ?   General: No focal deficit present.  ?   Mental Status: She is alert and oriented to person, place, and time. Mental status is at baseline.  ?Psychiatric:     ?   Mood and Affect: Mood normal.     ?   Behavior: Behavior normal.     ?   Thought Content: Thought content normal.     ?   Judgment: Judgment normal.  ? ? ?Results for orders placed or performed during the hospital encounter of 10/25/20  ?Surgical pathology  ?Result Value Ref Range  ? SURGICAL PATHOLOGY    ?  SURGICAL PATHOLOGY ?CASE: [id number] ?PATIENT: Caleesi Havel ?Surgical Pathology Report ? ? ? ? ?Specimen Submitted: ?A. Breast mass, right 11:00, 3 cmfn ? ?Clinical History: 1.9 cm right breast. 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 Heart shaped biopsy marker deployed. ? ? ? ? ? ?DIAGNOSIS: ?A. 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 ? ? ?GROSS DESCRIPTION: ?A. Labeled: Right breast 11:00 3 cm nipple ?Received: Formalin ?Time/date in fixative: Collected and placed in formalin at 9:15 AM on ?10/25/2020 ?Cold ischemic time: Less than 1 minute ?Total fixation time: Approximately 10 hours ?Core pieces: 4 cores ?Size: Range from 1.4-1.9 cm in  length and 0.2 cm in diameter ?Description: Received are cores of yellow and white fibrofatty tissue. ?Ink color: Green ?Entirely submitted in cassettes 1-2 with 2 cores per cassette. ? ? ?RB 10/25/2020 ? ?Final Diagnosi

## 2021-07-17 NOTE — Assessment & Plan Note (Signed)
Under good control on current regimen. Continue current regimen. Continue to monitor. Call with any concerns. Refills given.   

## 2021-07-17 NOTE — Assessment & Plan Note (Signed)
Under good control on current regimen. Continue current regimen. Continue to monitor. Call with any concerns. Refills given for 3 months. Follow up 3 months.    

## 2021-09-07 ENCOUNTER — Other Ambulatory Visit: Payer: Self-pay | Admitting: Family Medicine

## 2021-09-08 NOTE — Telephone Encounter (Signed)
Can we find out if patient needs refill and why she has increased use since her last visit?

## 2021-09-08 NOTE — Telephone Encounter (Signed)
Spoke with patient and she informed me that she went to pick up her Adderrall prescription yesterday and she asked the pharmacy "how many refills" she had left. She says they must have thought she asked for a refill, but she does not and she is good until her scheduled appointment.

## 2021-09-08 NOTE — Telephone Encounter (Signed)
Requested medication (s) are due for refill today: yes  Requested medication (s) are on the active medication list: yes  Last refill:  07/17/21 #45  Future visit scheduled: yes  Notes to clinic:  med not delegated to NT to RF   Requested Prescriptions  Pending Prescriptions Disp Refills   LORazepam (ATIVAN) 0.5 MG tablet [Pharmacy Med Name: LORazepam 0.5 MG TABLET] 45 tablet     Sig: TAKE 1 TABLET BY MOUTH AS NEEDED FOR ANXIETY     Not Delegated - Psychiatry: Anxiolytics/Hypnotics 2 Failed - 09/07/2021 12:04 PM      Failed - This refill cannot be delegated      Failed - Urine Drug Screen completed in last 360 days      Passed - Patient is not pregnant      Passed - Valid encounter within last 6 months    Recent Outpatient Visits           1 month ago Attention deficit hyperactivity disorder (ADHD), predominantly inattentive type   [facility], Megan P, DO   5 months ago Attention deficit hyperactivity disorder (ADHD), predominantly inattentive type   [facility], Megan P, DO   8 months ago [place] P, DO   12 months ago Breast lump on left side at 2 o'clock position   [place], Megan P, DO   1 year ago Depression, recurrent ([place])   [place], MD       Future Appointments             In 1 month Johnson, Barb Merino, DO [place], [facility]

## 2021-10-23 ENCOUNTER — Encounter: Payer: Self-pay | Admitting: Family Medicine

## 2021-10-23 ENCOUNTER — Ambulatory Visit (INDEPENDENT_AMBULATORY_CARE_PROVIDER_SITE_OTHER): Payer: Self-pay | Admitting: Family Medicine

## 2021-10-23 DIAGNOSIS — F332 Major depressive disorder, recurrent severe without psychotic features: Secondary | ICD-10-CM

## 2021-10-23 DIAGNOSIS — F419 Anxiety disorder, unspecified: Secondary | ICD-10-CM

## 2021-10-23 DIAGNOSIS — F900 Attention-deficit hyperactivity disorder, predominantly inattentive type: Secondary | ICD-10-CM

## 2021-10-23 MED ORDER — LORAZEPAM 0.5 MG PO TABS: 0.5000 mg | ORAL_TABLET | ORAL | 0 refills | Status: DC | PRN

## 2021-10-23 MED ORDER — VALACYCLOVIR HCL 1 G PO TABS: 1000.0000 mg | ORAL_TABLET | Freq: Two times a day (BID) | ORAL | 12 refills | Status: DC

## 2021-10-23 MED ORDER — AMPHETAMINE-DEXTROAMPHETAMINE 10 MG PO TABS: 10.0000 mg | ORAL_TABLET | Freq: Two times a day (BID) | ORAL | 0 refills | Status: DC

## 2021-10-23 MED ORDER — DULOXETINE HCL 60 MG PO CPEP: 60.0000 mg | ORAL_CAPSULE | Freq: Every day | ORAL | 2 refills | Status: DC

## 2021-10-23 NOTE — Assessment & Plan Note (Signed)
Not doing great. Will increase her cymbalta to '60mg'$  and recheck by mychart in about 2 weeks. If not better consider changing her adderall. Call with any concerns. Refill of lorazepam given.

## 2021-10-23 NOTE — Assessment & Plan Note (Signed)
Will keep current regimen now. May need to increase dose if cymbalta not helping. Follow up in 1 month.

## 2021-10-23 NOTE — Progress Notes (Signed)
BP 119/86   Pulse 99   Temp 98.5 F (36.9 C) (Oral)   Ht 5' 1.5" (1.562 m)   Wt 127 lb 12.8 oz (58 kg)   LMP 04/19/2015 (Exact Date)   SpO2 97%   BMI 23.76 kg/m    Subjective:    Patient ID: Karen Marsh, female    DOB: 06/14/1980, 41 y.o.   MRN: [id number]  HPI: Karen Marsh is a 41 y.o. female  Chief Complaint  Patient presents with   Depression    Wants to have her Cymbalta increased    Anxiety   ADHD    Wants to have her Adderall increased   ANXIETY/DEPRESSION Duration: chronic Status:exacerbated Anxious mood: yes  Excessive worrying: yes Irritability: yes  Sweating: no Nausea: no Palpitations:no Hyperventilation: no Panic attacks: no Agoraphobia: no  Obscessions/compulsions: no Depressed mood: yes    10/23/2021    3:06 PM 07/17/2021   10:21 AM 04/05/2021    9:55 AM 01/02/2021   10:26 AM 09/12/2020    4:10 PM  Depression screen PHQ 2/9  Decreased Interest 0 0 0 0 0  Down, Depressed, Hopeless 0 0 0 0 0  PHQ - 2 Score 0 0 0 0 0  Altered sleeping 1 1 0  0  Tired, decreased energy 2 2 1  1   Change in appetite 0 1 1  0  Feeling bad or failure about yourself  0 0 0  0  Trouble concentrating 3 2 1  1   Moving slowly or fidgety/restless 2 0 1  0  Suicidal thoughts 0 0 0  0  PHQ-9 Score 8 6 4  2   Difficult doing work/chores Somewhat difficult    Not difficult at all      10/23/2021    3:06 PM 07/17/2021   12:10 PM 04/05/2021    9:55 AM 01/02/2021   10:26 AM  GAD 7 : Generalized Anxiety Score  Nervous, Anxious, on Edge 3 2 1 3   Control/stop worrying 1 1 0 2  Worry too much - different things 2 1 0 3  Trouble relaxing 3 2 1 3   Restless 1 2 2 3   Easily annoyed or irritable 3 2 2 3   Afraid - awful might happen 0 1 0 2  Total GAD 7 Score 13 11 6 19   Anxiety Difficulty Somewhat difficult Somewhat difficult Somewhat difficult    Anhedonia: no Weight changes: no Insomnia: yes hard to fall asleep  Hypersomnia: yes Fatigue/loss of energy: yes Feelings  of worthlessness: yes Feelings of guilt: yes Impaired concentration/indecisiveness: yes Suicidal ideations: no  Crying spells: no Recent Stressors/Life Changes: yes   Relationship problems: no   Family stress: no     Financial stress: no    Job stress: yes    Recent death/loss: yes  ADHD FOLLOW UP ADHD status: uncontrolled Satisfied with current therapy: no Medication compliance:  excellent compliance Controlled substance contract: yes Previous psychiatry evaluation: yes Previous medications: yes    Taking meds on weekends/vacations: yes Work/school performance:  excellent Difficulty sustaining attention/completing tasks: yes Distracted by extraneous stimuli: yes Does not listen when spoken to: no  Fidgets with hands or feet: no Unable to stay in seat: no Blurts out/interrupts others: no ADHD Medication Side Effects: no    Decreased appetite: no    Headache: no    Sleeping disturbance pattern: no    Irritability: no    Rebound effects (worse than baseline) off medication: no    Anxiousness: no  Dizziness: no    Tics: no  Relevant past medical, surgical, family and social history reviewed and updated as indicated. Interim medical history since our last visit reviewed. Allergies and medications reviewed and updated.  Review of Systems  Constitutional: Negative.   Respiratory: Negative.    Cardiovascular: Negative.   Gastrointestinal: Negative.   Musculoskeletal: Negative.   Neurological: Negative.   Psychiatric/Behavioral:  Positive for decreased concentration and dysphoric mood. Negative for agitation, behavioral problems, confusion, hallucinations, self-injury, sleep disturbance and suicidal ideas. The patient is nervous/anxious. The patient is not hyperactive.     Per HPI unless specifically indicated above     Objective:    BP 119/86   Pulse 99   Temp 98.5 F (36.9 C) (Oral)   Ht 5' 1.5" (1.562 m)   Wt 127 lb 12.8 oz (58 kg)   LMP 04/19/2015 (Exact  Date)   SpO2 97%   BMI 23.76 kg/m   Wt Readings from Last 3 Encounters:  10/23/21 127 lb 12.8 oz (58 kg)  07/17/21 128 lb 9.6 oz (58.3 kg)  04/05/21 128 lb 12.8 oz (58.4 kg)    Physical Exam Vitals and nursing note reviewed.  Constitutional:      General: She is not in acute distress.    Appearance: Normal appearance. She is normal weight. She is not ill-appearing, toxic-appearing or diaphoretic.  HENT:     Head: Normocephalic and atraumatic.     Right Ear: External ear normal.     Left Ear: External ear normal.     Nose: Nose normal.     Mouth/Throat:     Mouth: Mucous membranes are moist.     Pharynx: Oropharynx is clear.  Eyes:     General: No scleral icterus.       Right eye: No discharge.        Left eye: No discharge.     Extraocular Movements: Extraocular movements intact.     Conjunctiva/sclera: Conjunctivae normal.     Pupils: Pupils are equal, round, and reactive to light.  Cardiovascular:     Rate and Rhythm: Normal rate and regular rhythm.     Pulses: Normal pulses.     Heart sounds: Normal heart sounds. No murmur heard.    No friction rub. No gallop.  Pulmonary:     Effort: Pulmonary effort is normal. No respiratory distress.     Breath sounds: Normal breath sounds. No stridor. No wheezing, rhonchi or rales.  Chest:     Chest wall: No tenderness.  Musculoskeletal:        General: Normal range of motion.     Cervical back: Normal range of motion and neck supple.  Skin:    General: Skin is warm and dry.     Capillary Refill: Capillary refill takes less than 2 seconds.     Coloration: Skin is not jaundiced or pale.     Findings: No bruising, erythema, lesion or rash.  Neurological:     General: No focal deficit present.     Mental Status: She is alert and oriented to person, place, and time. Mental status is at baseline.  Psychiatric:        Mood and Affect: Mood normal.        Behavior: Behavior normal.        Thought Content: Thought content normal.         Judgment: Judgment normal.     Results for orders placed or performed during the hospital encounter of 10/25/20  Surgical pathology  Result Value Ref Range   SURGICAL PATHOLOGY      SURGICAL PATHOLOGY CASE: [id number] PATIENT: Ledell Noss Surgical Pathology Report     Specimen Submitted: A. Breast mass, right 11:00, 3 cmfn  Clinical History: 1.9 cm right breast. 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 Heart shaped biopsy marker deployed.      DIAGNOSIS: A. BREAST, RIGHT, 11:00, 3 CM FROM NIPPLE, MASS; CORE BIOPSY: - FIBROADENOMA.   GROSS DESCRIPTION: A. Labeled: Right breast 11:00 3 cm nipple Received: Formalin Time/date in fixative: Collected and placed in formalin at 9:15 AM on 10/25/2020 Cold ischemic time: Less than 1 minute Total fixation time: Approximately 10 hours Core pieces: 4 cores Size: Range from 1.4-1.9 cm in length and 0.2 cm in diameter Description: Received are cores of yellow and white fibrofatty tissue. Ink color: Green Entirely submitted in cassettes 1-2 with 2 cores per cassette.   RB 10/25/2020  Final Diagnosis performed by Redmond Pulling, MD.   Electronically signed 10/26/2020 10:38:20AM The electronic s ignature indicates that the named Attending Pathologist has evaluated the specimen Technical component performed at [facility], [address] [place], [place] [address] Lab: [phone] Dir: Jolene Schimke, MD, MMM  Professional component performed at [facility], [address] [place], [place], [place] [address] Lab: [phone] Dir: Georgiann Cocker. Oneita Kras, MD       Assessment & Plan:   Problem List Items Addressed This Visit       Other   Severe recurrent major depression without psychotic features [facility])    Not doing great. Will increase her cymbalta to 60mg  and recheck by mychart in about 2 weeks. If not better consider changing her adderall. Call with any concerns. Refill of lorazepam given.       Relevant  Medications   DULoxetine (CYMBALTA) 60 MG capsule   LORazepam (ATIVAN) 0.5 MG tablet   Attention deficit hyperactivity disorder (ADHD)    Will keep current regimen now. May need to increase dose if cymbalta not helping. Follow up in 1 month.       Anxiety    Not doing great. Will increase her cymbalta to 60mg  and recheck by mychart in about 2 weeks. If not better consider changing her adderall. Call with any concerns. Refill of lorazepam given.       Relevant Medications   DULoxetine (CYMBALTA) 60 MG capsule   LORazepam (ATIVAN) 0.5 MG tablet     Follow up plan: Return in about 4 weeks (around 11/20/2021) for virtual OK.

## 2021-11-20 ENCOUNTER — Telehealth: Payer: Self-pay | Admitting: Family Medicine

## 2022-03-05 ENCOUNTER — Other Ambulatory Visit: Payer: Self-pay | Admitting: Family Medicine

## 2022-03-05 NOTE — Telephone Encounter (Signed)
Medication Refill - Medication: amphetamine-dextroamphetamine (ADDERALL) 10 MG tablet   Patient had to cancel her last appointment because she had COVID. Patient would like PCP to refill her medication to hold her over until her next appointment on 12/11  Preferred Pharmacy (with phone number or street name):  [facility] [id number], [place] Phone: [phone]  Fax: [phone]      Has the patient been seen for an appointment in the last year OR does the patient have an upcoming appointment? Yes.    Agent: Please be advised that RX refills may take up to 3 business days. We ask that you follow-up with your pharmacy.

## 2022-03-06 NOTE — Telephone Encounter (Signed)
Requested medications are due for refill today.  unsure  Requested medications are on the active medications list.  yes  Last refill. 10/23/2021 #60 0 rf  Future visit scheduled.   yes  Notes to clinic.  Rx expired 11/22/2021. Note from PT:  Patient had to cancel her last appointment because she had COVID. Patient would like PCP to refill her medication to hold her over until her next appointment on 12/11  Refill not delelgated.    Requested Prescriptions  Pending Prescriptions Disp Refills   amphetamine-dextroamphetamine (ADDERALL) 10 MG tablet 60 tablet 0    Sig: Take 1 tablet (10 mg total) by mouth 2 (two) times daily.     Not Delegated - Psychiatry:  Stimulants/ADHD Failed - 03/05/2022  3:16 PM      Failed - This refill cannot be delegated      Failed - Urine Drug Screen completed in last 360 days      Passed - Last BP in normal range    BP Readings from Last 1 Encounters:  10/23/21 119/86         Passed - Last Heart Rate in normal range    Pulse Readings from Last 1 Encounters:  10/23/21 99         Passed - Valid encounter within last 6 months    Recent Outpatient Visits           4 months ago Attention deficit hyperactivity disorder (ADHD), predominantly inattentive type   [facility], Megan P, DO   7 months ago Attention deficit hyperactivity disorder (ADHD), predominantly inattentive type   [facility], Megan P, DO   11 months ago Attention deficit hyperactivity disorder (ADHD), predominantly inattentive type   [place], [place], DO   1 year ago [place], DO   1 year ago Breast lump on left side at 2 o'clock position   [place], [place], DO       Future Appointments             In 1 week Wynetta Emery, Barb Merino, DO [employer], [facility]

## 2022-03-14 ENCOUNTER — Other Ambulatory Visit: Payer: Self-pay | Admitting: Family Medicine

## 2022-03-14 NOTE — Telephone Encounter (Signed)
Requested medication (s) are due for refill today: yes  Requested medication (s) are on the active medication list: yes  Last refill:  10/23/21 #60/0  Future visit scheduled: yes  Notes to clinic:  not delegated, patient has requested a modified quantity of 5 tablets until their appt on 03/19/22      Requested Prescriptions  Pending Prescriptions Disp Refills   amphetamine-dextroamphetamine (ADDERALL) 10 MG tablet 60 tablet 0    Sig: Take 1 tablet (10 mg total) by mouth 2 (two) times daily.     Not Delegated - Psychiatry:  Stimulants/ADHD Failed - 03/14/2022  5:14 PM      Failed - This refill cannot be delegated      Failed - Urine Drug Screen completed in last 360 days      Passed - Last BP in normal range    BP Readings from Last 1 Encounters:  10/23/21 119/86         Passed - Last Heart Rate in normal range    Pulse Readings from Last 1 Encounters:  10/23/21 99         Passed - Valid encounter within last 6 months    Recent Outpatient Visits           4 months ago Attention deficit hyperactivity disorder (ADHD), predominantly inattentive type   [facility], Megan P, DO   8 months ago Attention deficit hyperactivity disorder (ADHD), predominantly inattentive type   [facility], Megan P, DO   11 months ago Attention deficit hyperactivity disorder (ADHD), predominantly inattentive type   [place], [place], DO   1 year ago [place], DO   1 year ago Breast lump on left side at 2 o'clock position   [place], [place], DO       Future Appointments             In 5 days Wynetta Emery, Barb Merino, DO [employer], [facility]

## 2022-03-14 NOTE — Telephone Encounter (Signed)
Copied from [place] [id number]. Topic: General - Other >> Mar 14, 2022  4:23 PM Everette C wrote: Reason for CRM: Medication Refill - Medication: amphetamine-dextroamphetamine (ADDERALL) 10 MG tablet [[id number]] - the patient has requested a modified quantity of 5 tablets until their appt on 03/19/22  Has the patient contacted their pharmacy? Yes.   (Agent: If no, request that the patient contact the pharmacy for the refill. If patient does not wish to contact the pharmacy document the reason why and proceed with request.) (Agent: If yes, when and what did the pharmacy advise?)  Preferred Pharmacy (with phone number or street name): [facility] [id number], [place] [id number] Phone: [phone] Fax: [phone] Hours: Not open 24 hours   Has the patient been seen for an appointment in the last year OR does the patient have an upcoming appointment? Yes.    Agent: Please be advised that RX refills may take up to 3 business days. We ask that you follow-up with your pharmacy.

## 2022-03-15 NOTE — Telephone Encounter (Signed)
See message from [facility] in the prescription.

## 2022-03-16 MED ORDER — AMPHETAMINE-DEXTROAMPHETAMINE 10 MG PO TABS: 10.0000 mg | ORAL_TABLET | Freq: Two times a day (BID) | ORAL | 0 refills | Status: DC

## 2022-03-19 ENCOUNTER — Encounter: Payer: Self-pay | Admitting: Family Medicine

## 2022-03-19 ENCOUNTER — Ambulatory Visit (INDEPENDENT_AMBULATORY_CARE_PROVIDER_SITE_OTHER): Payer: Self-pay | Admitting: Family Medicine

## 2022-03-19 VITALS — BP 100/69 | HR 69 | Temp 98.3°F | Ht 61.5 in | Wt 121.3 lb

## 2022-03-19 DIAGNOSIS — F900 Attention-deficit hyperactivity disorder, predominantly inattentive type: Secondary | ICD-10-CM

## 2022-03-19 DIAGNOSIS — F332 Major depressive disorder, recurrent severe without psychotic features: Secondary | ICD-10-CM

## 2022-03-19 DIAGNOSIS — F419 Anxiety disorder, unspecified: Secondary | ICD-10-CM

## 2022-03-19 MED ORDER — AMPHETAMINE-DEXTROAMPHETAMINE 10 MG PO TABS: 10.0000 mg | ORAL_TABLET | Freq: Two times a day (BID) | ORAL | 0 refills | Status: DC

## 2022-03-19 MED ORDER — LORAZEPAM 0.5 MG PO TABS: 0.5000 mg | ORAL_TABLET | ORAL | 0 refills | Status: DC | PRN

## 2022-03-19 NOTE — Assessment & Plan Note (Signed)
Under good control on current regimen. Continue current regimen. Continue to monitor. Call with any concerns. Refills given. Call with any concerns.

## 2022-03-19 NOTE — Assessment & Plan Note (Signed)
Under good control on current regimen. Continue current regimen. Continue to monitor. Call with any concerns. Refills given for 3 months. Call with any concerns.  ?

## 2022-03-19 NOTE — Progress Notes (Signed)
BP 100/69   Pulse 69   Temp 98.3 F (36.8 C) (Oral)   Ht 5' 1.5" (1.562 m)   Wt 121 lb 4.8 oz (55 kg)   LMP 04/19/2015 (Exact Date)   SpO2 99%   BMI 22.55 kg/m    Subjective:    Patient ID: Karen Marsh, female    DOB: [birth year], 41 y.o.   MRN: [id number]  HPI: Karen Marsh is a 41 y.o. female  Chief Complaint  Patient presents with   Anxiety   Depression   ADHD   ADHD FOLLOW UP ADHD status: controlled Satisfied with current therapy: yes Medication compliance:  excellent compliance Controlled substance contract: yes Previous psychiatry evaluation: no Previous medications: yes    Taking meds on weekends/vacations: occasionally Work/school performance:  good Difficulty sustaining attention/completing tasks: no Distracted by extraneous stimuli: no Does not listen when spoken to: no  Fidgets with hands or feet: no Unable to stay in seat: no Blurts out/interrupts others: no ADHD Medication Side Effects: no    Decreased appetite: no    Headache: no    Sleeping disturbance pattern: no    Irritability: no    Rebound effects (worse than baseline) off medication: no    Anxiousness: no    Dizziness: no    Tics: no  ANXIETY/DEPRESSION- cut down on drinking about 30 days, so anxiety got a bit worse, was taking a little more lorazepam Duration: chronic Status:stable Anxious mood: yes  Excessive worrying: no Irritability: no  Sweating: no Nausea: no Palpitations:no Hyperventilation: no Panic attacks: no Agoraphobia: no  Obscessions/compulsions: no Depressed mood: no    03/19/2022    8:57 AM 10/23/2021    3:06 PM 07/17/2021   10:21 AM 04/05/2021    9:55 AM 01/02/2021   10:26 AM  Depression screen PHQ 2/9  Decreased Interest 1 0 0 0 0  Down, Depressed, Hopeless 1 0 0 0 0  PHQ - 2 Score 2 0 0 0 0  Altered sleeping 0 1 1 0   Tired, decreased energy 2 2 2 1    Change in appetite 1 0 1 1   Feeling bad or failure about yourself  2 0 0 0   Trouble  concentrating 1 3 2 1    Moving slowly or fidgety/restless 1 2 0 1   Suicidal thoughts 0 0 0 0   PHQ-9 Score 9 8 6 4    Difficult doing work/chores Somewhat difficult Somewhat difficult         03/19/2022    8:58 AM 10/23/2021    3:06 PM 07/17/2021   12:10 PM 04/05/2021    9:55 AM  GAD 7 : Generalized Anxiety Score  Nervous, Anxious, on Edge 2 3 2 1   Control/stop worrying 2 1 1  0  Worry too much - different things 2 2 1  0  Trouble relaxing 2 3 2 1   Restless 2 1 2 2   Easily annoyed or irritable 2 3 2 2   Afraid - awful might happen 1 0 1 0  Total GAD 7 Score 13 13 11 6   Anxiety Difficulty Somewhat difficult Somewhat difficult Somewhat difficult Somewhat difficult   Anhedonia: no Weight changes: no Insomnia: no   Hypersomnia: no Fatigue/loss of energy: no Feelings of worthlessness: no Feelings of guilt: no Impaired concentration/indecisiveness: no Suicidal ideations: no  Crying spells: no Recent Stressors/Life Changes: yes   Relationship problems: no   Family stress: no     Financial stress: no    Job stress:  yes    Recent death/loss: no   Relevant past medical, surgical, family and social history reviewed and updated as indicated. Interim medical history since our last visit reviewed. Allergies and medications reviewed and updated.  Review of Systems  Constitutional: Negative.   Respiratory: Negative.    Cardiovascular: Negative.   Gastrointestinal: Negative.   Musculoskeletal: Negative.   Neurological: Negative.   Psychiatric/Behavioral:  Negative for agitation, behavioral problems, confusion, decreased concentration, dysphoric mood, hallucinations, self-injury, sleep disturbance and suicidal ideas. The patient is nervous/anxious. The patient is not hyperactive.     Per HPI unless specifically indicated above     Objective:    BP 100/69   Pulse 69   Temp 98.3 F (36.8 C) (Oral)   Ht 5' 1.5" (1.562 m)   Wt 121 lb 4.8 oz (55 kg)   LMP 04/19/2015 (Exact Date)    SpO2 99%   BMI 22.55 kg/m   Wt Readings from Last 3 Encounters:  03/19/22 121 lb 4.8 oz (55 kg)  10/23/21 127 lb 12.8 oz (58 kg)  07/17/21 128 lb 9.6 oz (58.3 kg)    Physical Exam Vitals and nursing note reviewed.  Constitutional:      General: She is not in acute distress.    Appearance: Normal appearance. She is normal weight. She is not ill-appearing, toxic-appearing or diaphoretic.  HENT:     Head: Normocephalic and atraumatic.     Right Ear: External ear normal.     Left Ear: External ear normal.     Nose: Nose normal.     Mouth/Throat:     Mouth: Mucous membranes are moist.     Pharynx: Oropharynx is clear.  Eyes:     General: No scleral icterus.       Right eye: No discharge.        Left eye: No discharge.     Extraocular Movements: Extraocular movements intact.     Conjunctiva/sclera: Conjunctivae normal.     Pupils: Pupils are equal, round, and reactive to light.  Cardiovascular:     Rate and Rhythm: Normal rate and regular rhythm.     Pulses: Normal pulses.     Heart sounds: Normal heart sounds. No murmur heard.    No friction rub. No gallop.  Pulmonary:     Effort: Pulmonary effort is normal. No respiratory distress.     Breath sounds: Normal breath sounds. No stridor. No wheezing, rhonchi or rales.  Chest:     Chest wall: No tenderness.  Musculoskeletal:        General: Normal range of motion.     Cervical back: Normal range of motion and neck supple.  Skin:    General: Skin is warm and dry.     Capillary Refill: Capillary refill takes less than 2 seconds.     Coloration: Skin is not jaundiced or pale.     Findings: No bruising, erythema, lesion or rash.  Neurological:     General: No focal deficit present.     Mental Status: She is alert and oriented to person, place, and time. Mental status is at baseline.  Psychiatric:        Mood and Affect: Mood normal.        Behavior: Behavior normal.        Thought Content: Thought content normal.         Judgment: Judgment normal.     Results for orders placed or performed during the hospital encounter of 10/25/20  Surgical pathology  Result Value Ref Range   SURGICAL PATHOLOGY      SURGICAL PATHOLOGY CASE: [id number] PATIENT: Ledell Noss Surgical Pathology Report     Specimen Submitted: A. Breast mass, right 11:00, 3 cmfn  Clinical History: 1.9 cm right breast. 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 Heart shaped biopsy marker deployed.      DIAGNOSIS: A. BREAST, RIGHT, 11:00, 3 CM FROM NIPPLE, MASS; CORE BIOPSY: - FIBROADENOMA.   GROSS DESCRIPTION: A. Labeled: Right breast 11:00 3 cm nipple Received: Formalin Time/date in fixative: Collected and placed in formalin at 9:15 AM on 10/25/2020 Cold ischemic time: Less than 1 minute Total fixation time: Approximately 10 hours Core pieces: 4 cores Size: Range from 1.4-1.9 cm in length and 0.2 cm in diameter Description: Received are cores of yellow and white fibrofatty tissue. Ink color: Green Entirely submitted in cassettes 1-2 with 2 cores per cassette.   RB 10/25/2020  Final Diagnosis performed by Redmond Pulling, MD.   Electronically signed 10/26/2020 10:38:20AM The electronic s ignature indicates that the named Attending Pathologist has evaluated the specimen Technical component performed at [facility], [address], [place], [place] [address] Lab: [phone] Dir: Jolene Schimke, MD, MMM  Professional component performed at [facility], [facility], [address] [place], [place], [place] [address] Lab: [phone] Dir: Georgiann Cocker. [place], MD       Assessment & Plan:   Problem List Items Addressed This Visit       Other   Severe recurrent major depression without psychotic features (HCC)    Under good control on current regimen. Continue current regimen. Continue to monitor. Call with any concerns. Refills given. Call with any concerns.       Relevant Medications   LORazepam (ATIVAN) 0.5 MG  tablet   Attention deficit hyperactivity disorder (ADHD) - Primary    Under good control on current regimen. Continue current regimen. Continue to monitor. Call with any concerns. Refills given for 3 months. Call with any concerns.        Anxiety    Under good control on current regimen. Continue current regimen. Continue to monitor. Call with any concerns. Refills given for 3 months. Call with any concerns.       Relevant Medications   LORazepam (ATIVAN) 0.5 MG tablet     Follow up plan: Return in about 3 months (around 06/18/2022).

## 2022-05-16 ENCOUNTER — Other Ambulatory Visit: Payer: Self-pay | Admitting: Family Medicine

## 2022-05-16 NOTE — Telephone Encounter (Signed)
Requested medication (s) are due for refill today: yes  Requested medication (s) are on the active medication list: yes  Last refill:  03/19/22  Future visit scheduled: no  Notes to clinic:  Unable to refill per protocol, cannot delegate.      Requested Prescriptions  Pending Prescriptions Disp Refills   LORazepam (ATIVAN) 0.5 MG tablet [Pharmacy Med Name: LORazepam 0.5 MG TABLET] 45 tablet     Sig: TAKE 1 TABLET BY MOUTH AS NEEDED FOR ANXIETY     Not Delegated - Psychiatry: Anxiolytics/Hypnotics 2 Failed - 05/16/2022  9:45 AM      Failed - This refill cannot be delegated      Failed - Urine Drug Screen completed in last 360 days      Passed - Patient is not pregnant      Passed - Valid encounter within last 6 months    Recent Outpatient Visits           1 month ago Attention deficit hyperactivity disorder (ADHD), predominantly inattentive type   [facility], Karen P, DO   6 months ago Attention deficit hyperactivity disorder (ADHD), predominantly inattentive type   [facility], Karen P, DO   10 months ago Attention deficit hyperactivity disorder (ADHD), predominantly inattentive type    [facility] [place], [place], DO   1 year ago Attention deficit hyperactivity disorder (ADHD), predominantly inattentive type   [place], Karen [place], DO   1 year ago [place], Karen Merino, DO       Future Appointments             In 1 month [place], Karen Merino, DO [place], [facility]

## 2022-05-20 NOTE — Telephone Encounter (Signed)
Please find out if something is going on- she should not be due until next visit

## 2022-06-18 ENCOUNTER — Ambulatory Visit: Payer: Self-pay | Admitting: Family Medicine

## 2022-07-25 ENCOUNTER — Other Ambulatory Visit: Payer: Self-pay | Admitting: Family Medicine

## 2022-07-26 NOTE — Telephone Encounter (Signed)
Requested Prescriptions  Pending Prescriptions Disp Refills   DULoxetine (CYMBALTA) 60 MG capsule [Pharmacy Med Name: DULoxetine HCL DR 60 MG CAPSULE] 30 capsule 2    Sig: TAKE 1 CAPSULE BY MOUTH DAILY     Psychiatry: Antidepressants - SNRI - duloxetine Failed - 07/25/2022  6:22 AM      Failed - Cr in normal range and within 360 days    Creatinine, Ser  Date Value Ref Range Status  06/27/2020 0.60 0.57 - 1.00 mg/dL Final         Failed - eGFR is 30 or above and within 360 days    GFR calc Af Amer  Date Value Ref Range Status  06/10/2017 >60 >60 mL/min Final    Comment:    (NOTE) The eGFR has been calculated using the CKD EPI equation. This calculation has not been validated in all clinical situations. eGFR's persistently <60 mL/min signify possible Chronic Kidney Disease.    GFR calc non Af Amer  Date Value Ref Range Status  06/10/2017 >60 >60 mL/min Final   eGFR  Date Value Ref Range Status  06/27/2020 118 >59 mL/min/1.73 Final         Passed - Completed PHQ-2 or PHQ-9 in the last 360 days      Passed - Last BP in normal range    BP Readings from Last 1 Encounters:  03/19/22 100/69         Passed - Valid encounter within last 6 months    Recent Outpatient Visits           4 months ago Attention deficit hyperactivity disorder (ADHD), predominantly inattentive type   [place] [facility] [place], Megan P, DO   9 months ago Attention deficit hyperactivity disorder (ADHD), predominantly inattentive type   [place] [facility] [place], Megan P, DO   1 year ago Attention deficit hyperactivity disorder (ADHD), predominantly inattentive type   [place] [facility] [place], Megan P, DO   1 year ago Attention deficit hyperactivity disorder (ADHD), predominantly inattentive type   [place] [facility] [place], Megan P, DO   1 year ago Anxiety   [place] [facility] [place], [place], DO

## 2022-08-29 ENCOUNTER — Encounter: Payer: Self-pay | Admitting: Family Medicine

## 2022-08-29 ENCOUNTER — Ambulatory Visit (INDEPENDENT_AMBULATORY_CARE_PROVIDER_SITE_OTHER): Payer: Self-pay | Admitting: Family Medicine

## 2022-08-29 VITALS — BP 119/79 | HR 73 | Temp 98.2°F | Wt 116.0 lb

## 2022-08-29 DIAGNOSIS — Z13220 Encounter for screening for lipoid disorders: Secondary | ICD-10-CM

## 2022-08-29 DIAGNOSIS — Z1231 Encounter for screening mammogram for malignant neoplasm of breast: Secondary | ICD-10-CM

## 2022-08-29 DIAGNOSIS — F419 Anxiety disorder, unspecified: Secondary | ICD-10-CM

## 2022-08-29 DIAGNOSIS — F332 Major depressive disorder, recurrent severe without psychotic features: Secondary | ICD-10-CM

## 2022-08-29 DIAGNOSIS — F900 Attention-deficit hyperactivity disorder, predominantly inattentive type: Secondary | ICD-10-CM

## 2022-08-29 MED ORDER — DULOXETINE HCL 40 MG PO CPEP: 40.0000 mg | ORAL_CAPSULE | Freq: Every day | ORAL | 2 refills | Status: DC

## 2022-08-29 MED ORDER — AMPHETAMINE-DEXTROAMPHETAMINE 10 MG PO TABS: 10.0000 mg | ORAL_TABLET | Freq: Two times a day (BID) | ORAL | 0 refills | Status: DC

## 2022-08-29 MED ORDER — LORAZEPAM 0.5 MG PO TABS: 0.5000 mg | ORAL_TABLET | ORAL | 0 refills | Status: DC | PRN

## 2022-08-29 NOTE — Progress Notes (Signed)
BP 119/79   Pulse 73   Temp 98.2 F (36.8 C) (Oral)   Wt 116 lb (52.6 kg)   LMP 04/19/2015 (Exact Date)   SpO2 97%   BMI 21.56 kg/m    Subjective:    Patient ID: Karen Marsh, female    DOB: Nov 28, 1980, 42 y.o.   MRN: [id number]  HPI: Karen Marsh is a 42 y.o. female  Chief Complaint  Patient presents with  . ADHD  . Anxiety  . Depression  . Medication Dose Change    Patient says she would like to discuss possibly lowering her current dose of Cymbalta as she feels it is too much.    ANXIETY/DEPRESSION Duration:{Blank single:19197::"controlled","uncontrolled","better","worse","exacerbated","stable"} Anxious mood: {Blank single:19197::"yes","no"}  Excessive worrying: {Blank single:19197::"yes","no"} Irritability: {Blank single:19197::"yes","no"}  Sweating: {Blank single:19197::"yes","no"} Nausea: {Blank single:19197::"yes","no"} Palpitations:{Blank single:19197::"yes","no"} Hyperventilation: {Blank single:19197::"yes","no"} Panic attacks: {Blank single:19197::"yes","no"} Agoraphobia: {Blank single:19197::"yes","no"}  Obscessions/compulsions: {Blank single:19197::"yes","no"} Depressed mood: {Blank single:19197::"yes","no"}    08/29/2022    9:46 AM 03/19/2022    8:57 AM 10/23/2021    3:06 PM 07/17/2021   10:21 AM 04/05/2021    9:55 AM  Depression screen PHQ 2/9  Decreased Interest 0 1 0 0 0  Down, Depressed, Hopeless 0 1 0 0 0  PHQ - 2 Score 0 2 0 0 0  Altered sleeping 0 0 1 1 0  Tired, decreased energy 2 2 2 2 1   Change in appetite 0 1 0 1 1  Feeling bad or failure about yourself  0 2 0 0 0  Trouble concentrating 1 1 3 2 1   Moving slowly or fidgety/restless 0 1 2 0 1  Suicidal thoughts 0 0 0 0 0  PHQ-9 Score 3 9 8 6 4   Difficult doing work/chores Somewhat difficult Somewhat difficult Somewhat difficult     Anhedonia: {Blank single:19197::"yes","no"} Weight changes: {Blank single:19197::"yes","no"} Insomnia: {Blank single:19197::"yes","no"} {Blank  single:19197::"hard to fall asleep","hard to stay asleep"}  Hypersomnia: {Blank single:19197::"yes","no"} Fatigue/loss of energy: {Blank single:19197::"yes","no"} Feelings of worthlessness: {Blank single:19197::"yes","no"} Feelings of guilt: {Blank single:19197::"yes","no"} Impaired concentration/indecisiveness: {Blank single:19197::"yes","no"} Suicidal ideations: {Blank single:19197::"yes","no"}  Crying spells: {Blank single:19197::"yes","no"} Recent Stressors/Life Changes: {Blank single:19197::"yes","no"}   Relationship problems: {Blank single:19197::"yes","no"}   Family stress: {Blank single:19197::"yes","no"}     Financial stress: {Blank single:19197::"yes","no"}    Job stress: {Blank single:19197::"yes","no"}    Recent death/loss: {Blank single:19197::"yes","no"}  ADHD FOLLOW UP ADHD status: {Blank single:19197::"controlled","uncontrolled","better","worse","exacerbated","stable"} Satisfied with current therapy: {Blank single:19197::"yes","no"} Medication compliance:  {Blank single:19197::"excellent compliance","good compliance","fair compliance","poor compliance"} Controlled substance contract: {Blank single:19197::"yes","no"} Previous psychiatry evaluation: {Blank single:19197::"yes","no"} Previous medications: {Blank single:19197::"yes","no"} {Blank multiple:19196::"adderall","adderall XR","concerta","daytrana (methylphenidate)","focalin (dexamethylphenidate)". "ritalin","ritalin LA","ritalin SR","stratera (atomoxetine)","vyvanse (lisdexamfethamine)","wellbutrin"}   Taking meds on weekends/vacations: {Blank single:19197::"yes","no","occasionally"} Work/school performance:  {Blank single:19197::"excellent","good","average","fair","poor"} Difficulty sustaining attention/completing tasks: {Blank single:19197::"yes","no"} Distracted by extraneous stimuli: {Blank single:19197::"yes","no"} Does not listen when spoken to: {Blank single:19197::"yes","no"}  Fidgets with hands or feet: {Blank  single:19197::"yes","no"} Unable to stay in seat: {Blank single:19197::"yes","no"} Blurts out/interrupts others: {Blank single:19197::"yes","no"} ADHD Medication Side Effects: {Blank single:19197::"yes","no"}    Decreased appetite: {Blank single:19197::"yes","no"}    Headache: {Blank single:19197::"yes","no"}    Sleeping disturbance pattern: {Blank single:19197::"yes","no"}    Irritability: {Blank single:19197::"yes","no"}    Rebound effects (worse than baseline) off medication: {Blank single:19197::"yes","no"}    Anxiousness: {Blank single:19197::"yes","no"}    Dizziness: {Blank single:19197::"yes","no"}    Tics: {Blank single:19197::"yes","no"}  Relevant past medical, surgical, family and social history reviewed and updated as indicated. Interim medical history since our last visit reviewed. Allergies and medications reviewed and updated.  Review of Systems  Per  HPI unless specifically indicated above     Objective:    BP 119/79   Pulse 73   Temp 98.2 F (36.8 C) (Oral)   Wt 116 lb (52.6 kg)   LMP 04/19/2015 (Exact Date)   SpO2 97%   BMI 21.56 kg/m   Wt Readings from Last 3 Encounters:  08/29/22 116 lb (52.6 kg)  03/19/22 121 lb 4.8 oz (55 kg)  10/23/21 127 lb 12.8 oz (58 kg)    Physical Exam  Results for orders placed or performed during the hospital encounter of 10/25/20  Surgical pathology  Result Value Ref Range   SURGICAL PATHOLOGY      SURGICAL PATHOLOGY CASE: [id number] PATIENT: [facility] Surgical Pathology Report     Specimen Submitted: A. Breast mass, right 11:00, 3 cmfn  Clinical History: 1.9 cm right breast. 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 Heart shaped biopsy marker deployed.      DIAGNOSIS: A. BREAST, RIGHT, 11:00, 3 CM FROM NIPPLE, MASS; CORE BIOPSY: - FIBROADENOMA.   GROSS DESCRIPTION: A. Labeled: Right breast 11:00 3 cm nipple Received: Formalin Time/date in fixative: Collected and placed in formalin at 9:15 AM  on 10/25/2020 Cold ischemic time: Less than 1 minute Total fixation time: Approximately 10 hours Core pieces: 4 cores Size: Range from 1.4-1.9 cm in length and 0.2 cm in diameter Description: Received are cores of yellow and white fibrofatty tissue. Ink color: Green Entirely submitted in cassettes 1-2 with 2 cores per cassette.   RB 10/25/2020  Final Diagnosis performed by Redmond Pulling, MD.   Electronically signed 10/26/2020 10:38:20AM The electronic s ignature indicates that the named Attending Pathologist has evaluated the specimen Technical component performed at [facility], [address], [place], [place] [address] Lab: [phone] Dir: Jolene Schimke, MD, MMM  Professional component performed at [facility], [facility], [address] [place], [place], [place] [address] Lab: [phone] Dir: Georgiann Cocker. Oneita Kras, MD       Assessment & Plan:   Problem List Items Addressed This Visit   None    Follow up plan: No follow-ups on file.

## 2022-08-30 LAB — CBC WITH DIFFERENTIAL/PLATELET
Basophils Absolute: 0.1 10*3/uL (ref 0.0–0.2)
Basos: 1 %
EOS (ABSOLUTE): 0.1 10*3/uL (ref 0.0–0.4)
Eos: 1 %
Hematocrit: 43.4 % (ref 34.0–46.6)
Hemoglobin: 14.5 g/dL (ref 11.1–15.9)
Immature Grans (Abs): 0 10*3/uL (ref 0.0–0.1)
Immature Granulocytes: 0 %
Lymphocytes Absolute: 1.8 10*3/uL (ref 0.7–3.1)
Lymphs: 25 %
MCH: 30.1 pg (ref 26.6–33.0)
MCHC: 33.4 g/dL (ref 31.5–35.7)
MCV: 90 fL (ref 79–97)
Monocytes Absolute: 0.4 10*3/uL (ref 0.1–0.9)
Monocytes: 6 %
Neutrophils Absolute: 4.7 10*3/uL (ref 1.4–7.0)
Neutrophils: 67 %
Platelets: 369 10*3/uL (ref 150–450)
RBC: 4.82 x10E6/uL (ref 3.77–5.28)
RDW: 12.9 % (ref 11.7–15.4)
WBC: 7.1 10*3/uL (ref 3.4–10.8)

## 2022-08-30 LAB — COMPREHENSIVE METABOLIC PANEL
ALT: 23 IU/L (ref 0–32)
AST: 19 IU/L (ref 0–40)
Albumin/Globulin Ratio: 2 (ref 1.2–2.2)
Albumin: 4.5 g/dL (ref 3.9–4.9)
Alkaline Phosphatase: 61 IU/L (ref 44–121)
BUN/Creatinine Ratio: 18 (ref 9–23)
BUN: 13 mg/dL (ref 6–24)
Bilirubin Total: 0.4 mg/dL (ref 0.0–1.2)
CO2: 21 mmol/L (ref 20–29)
Calcium: 9.4 mg/dL (ref 8.7–10.2)
Chloride: 103 mmol/L (ref 96–106)
Creatinine, Ser: 0.74 mg/dL (ref 0.57–1.00)
Globulin, Total: 2.3 g/dL (ref 1.5–4.5)
Glucose: 88 mg/dL (ref 70–99)
Potassium: 4.7 mmol/L (ref 3.5–5.2)
Sodium: 138 mmol/L (ref 134–144)
Total Protein: 6.8 g/dL (ref 6.0–8.5)
eGFR: 104 mL/min/{1.73_m2} (ref >59)

## 2022-08-30 LAB — LIPID PANEL W/O CHOL/HDL RATIO
Cholesterol, Total: 252 mg/dL — ABNORMAL HIGH (ref 100–199)
HDL: 77 mg/dL (ref >39)
LDL Chol Calc (NIH): 159 mg/dL — ABNORMAL HIGH (ref 0–99)
Triglycerides: 95 mg/dL (ref 0–149)
VLDL Cholesterol Cal: 16 mg/dL (ref 5–40)

## 2022-08-30 LAB — TSH: TSH: 1.63 u[IU]/mL (ref 0.450–4.500)

## 2022-08-30 NOTE — Assessment & Plan Note (Signed)
Doing great. Will cut down on her cymbalta and recheck in 3 months. Call with any concerns. Continue to monitor. Refill of lorazepam given even though she doesn't need it right now.

## 2022-08-30 NOTE — Assessment & Plan Note (Signed)
Under good control on current regimen. Continue current regimen. Continue to monitor. Call with any concerns. Refills given for 3 months. Follow up 3 months.    

## 2022-08-30 NOTE — Assessment & Plan Note (Signed)
Doing great. Will cut down on her cymbalta and recheck in 3 months. Call with any concerns. Continue to monitor. Refill of lorazepam given even though she doesn't need it right now. 

## 2022-11-06 ENCOUNTER — Telehealth: Payer: Self-pay

## 2022-11-06 NOTE — Telephone Encounter (Signed)
Contacted patient due to patient being listed on the [employer] report for our office, verified patients name and DOB. Report states that the patient was discharged from an ER in [place] 11/05/22. Patient states that she is not in [place], has not been in [place], and is currently in [place]. Patient stated she has not been to an ER anywhere. Patient asked if this was some kind of fraud or is someone was using her name. I explained to the patient that I was taking this matter to my Research officer, political party and that we would get more information for her.

## 2022-11-06 NOTE — Telephone Encounter (Signed)
Contacted patient and informed her that [employer] will be contacted and informed that information was sent on the wrong patient. Patient has the same name and DOB but does not have [employer] insurance plan and was not seen at a hospital in [place] on 11/05/22. Patient acknowledged understanding and thanked me for calling.

## 2022-11-29 ENCOUNTER — Ambulatory Visit: Payer: Self-pay | Admitting: Family Medicine

## 2022-12-17 ENCOUNTER — Ambulatory Visit (INDEPENDENT_AMBULATORY_CARE_PROVIDER_SITE_OTHER): Payer: Self-pay | Admitting: Family Medicine

## 2022-12-17 ENCOUNTER — Encounter: Payer: Self-pay | Admitting: Family Medicine

## 2022-12-17 VITALS — BP 119/80 | HR 71 | Temp 97.7°F | Wt 124.0 lb

## 2022-12-17 DIAGNOSIS — F900 Attention-deficit hyperactivity disorder, predominantly inattentive type: Secondary | ICD-10-CM

## 2022-12-17 DIAGNOSIS — F332 Major depressive disorder, recurrent severe without psychotic features: Secondary | ICD-10-CM

## 2022-12-17 DIAGNOSIS — F419 Anxiety disorder, unspecified: Secondary | ICD-10-CM

## 2022-12-17 DIAGNOSIS — D6851 Activated protein C resistance: Secondary | ICD-10-CM

## 2022-12-17 MED ORDER — AMPHETAMINE-DEXTROAMPHETAMINE 10 MG PO TABS: 10.0000 mg | ORAL_TABLET | Freq: Two times a day (BID) | ORAL | 0 refills | Status: DC

## 2022-12-17 MED ORDER — DULOXETINE HCL 40 MG PO CPEP: 40.0000 mg | ORAL_CAPSULE | Freq: Every day | ORAL | 2 refills | Status: DC

## 2022-12-17 MED ORDER — ALBUTEROL SULFATE HFA 108 (90 BASE) MCG/ACT IN AERS: 2.0000 | INHALATION_SPRAY | RESPIRATORY_TRACT | 4 refills | Status: DC | PRN

## 2022-12-17 MED ORDER — LORAZEPAM 0.5 MG PO TABS: 0.5000 mg | ORAL_TABLET | ORAL | 0 refills | Status: DC | PRN

## 2022-12-17 MED ORDER — VALACYCLOVIR HCL 1 G PO TABS: 1000.0000 mg | ORAL_TABLET | Freq: Two times a day (BID) | ORAL | 12 refills | Status: DC

## 2022-12-17 NOTE — Assessment & Plan Note (Signed)
Continue aspirin. Call with any concerns.

## 2022-12-17 NOTE — Progress Notes (Signed)
BP 119/80   Pulse 71   Temp 97.7 F (36.5 C) (Oral)   Wt 124 lb (56.2 kg)   LMP 04/19/2015 (Exact Date)   SpO2 98%   BMI 23.05 kg/m    Subjective:    Patient ID: Karen Marsh, female    DOB: 1981/03/08, 42 y.o.   MRN: [id number]  HPI: Karen Marsh is a 42 y.o. female  Chief Complaint  Patient presents with   ADHD   Anxiety   Depression   ANXIETY/Depression Duration: chronic Status:exacerbated Anxious mood: yes  Excessive worrying: no Irritability: no  Sweating: no Nausea: no Palpitations:no Hyperventilation: no Panic attacks: no Agoraphobia: no  Obscessions/compulsions: no Depressed mood: no    12/17/2022    2:16 PM 08/29/2022    9:46 AM 03/19/2022    8:57 AM 10/23/2021    3:06 PM 07/17/2021   10:21 AM  Depression screen PHQ 2/9  Decreased Interest 1 0 1 0 0  Down, Depressed, Hopeless 1 0 1 0 0  PHQ - 2 Score 2 0 2 0 0  Altered sleeping 1 0 0 1 1  Tired, decreased energy 1 2 2 2 2   Change in appetite 0 0 1 0 1  Feeling bad or failure about yourself  1 0 2 0 0  Trouble concentrating 1 1 1 3 2   Moving slowly or fidgety/restless 1 0 1 2 0  Suicidal thoughts 0 0 0 0 0  PHQ-9 Score 7 3 9 8 6   Difficult doing work/chores Somewhat difficult Somewhat difficult Somewhat difficult Somewhat difficult    Anhedonia: no Weight changes: no Insomnia: no   Hypersomnia: no Fatigue/loss of energy: no Feelings of worthlessness: no Feelings of guilt: no Impaired concentration/indecisiveness: no Suicidal ideations: no  Crying spells: no Recent Stressors/Life Changes: no   Relationship problems: no   Family stress: no     Financial stress: no    Job stress: no    Recent death/loss: no  ADHD FOLLOW UP ADHD status: stable Satisfied with current therapy: was better on previous brand she was on Medication compliance:  excellent compliance Controlled substance contract: yes Previous psychiatry evaluation: yes Previous medications: yes    Taking meds on  weekends/vacations: occasionally Work/school performance:  good Difficulty sustaining attention/completing tasks: yes Distracted by extraneous stimuli: no Does not listen when spoken to: no  Fidgets with hands or feet: no Unable to stay in seat: no Blurts out/interrupts others: no ADHD Medication Side Effects: no    Decreased appetite: no    Headache: no    Sleeping disturbance pattern: no    Irritability: no    Rebound effects (worse than baseline) off medication: yes    Anxiousness: yes    Dizziness: no    Tics: no   Relevant past medical, surgical, family and social history reviewed and updated as indicated. Interim medical history since our last visit reviewed. Allergies and medications reviewed and updated.  Review of Systems  Constitutional: Negative.   Respiratory: Negative.    Cardiovascular: Negative.   Gastrointestinal: Negative.   Musculoskeletal: Negative.   Neurological: Negative.   Psychiatric/Behavioral: Negative.      Per HPI unless specifically indicated above     Objective:    BP 119/80   Pulse 71   Temp 97.7 F (36.5 C) (Oral)   Wt 124 lb (56.2 kg)   LMP 04/19/2015 (Exact Date)   SpO2 98%   BMI 23.05 kg/m   Wt Readings from Last 3 Encounters:  12/17/22 124 lb (56.2 kg)  08/29/22 116 lb (52.6 kg)  03/19/22 121 lb 4.8 oz (55 kg)    Physical Exam Vitals and nursing note reviewed.  Constitutional:      General: She is not in acute distress.    Appearance: Normal appearance. She is not ill-appearing, toxic-appearing or diaphoretic.  HENT:     Head: Normocephalic and atraumatic.     Right Ear: External ear normal.     Left Ear: External ear normal.     Nose: Nose normal.     Mouth/Throat:     Mouth: Mucous membranes are moist.     Pharynx: Oropharynx is clear.  Eyes:     General: No scleral icterus.       Right eye: No discharge.        Left eye: No discharge.     Extraocular Movements: Extraocular movements intact.      Conjunctiva/sclera: Conjunctivae normal.     Pupils: Pupils are equal, round, and reactive to light.  Cardiovascular:     Rate and Rhythm: Normal rate and regular rhythm.     Pulses: Normal pulses.     Heart sounds: Normal heart sounds. No murmur heard.    No friction rub. No gallop.  Pulmonary:     Effort: Pulmonary effort is normal. No respiratory distress.     Breath sounds: Normal breath sounds. No stridor. No wheezing, rhonchi or rales.  Chest:     Chest wall: No tenderness.  Musculoskeletal:        General: Normal range of motion.     Cervical back: Normal range of motion and neck supple.  Skin:    General: Skin is warm and dry.     Capillary Refill: Capillary refill takes less than 2 seconds.     Coloration: Skin is not jaundiced or pale.     Findings: No bruising, erythema, lesion or rash.  Neurological:     General: No focal deficit present.     Mental Status: She is alert and oriented to person, place, and time. Mental status is at baseline.  Psychiatric:        Mood and Affect: Mood normal.        Behavior: Behavior normal.        Thought Content: Thought content normal.        Judgment: Judgment normal.     Results for orders placed or performed in visit on 08/29/22  CBC with Differential/Platelet  Result Value Ref Range   WBC 7.1 3.4 - 10.8 x10E3/uL   RBC 4.82 3.77 - 5.28 x10E6/uL   Hemoglobin 14.5 11.1 - 15.9 g/dL   Hematocrit 16.1 09.6 - 46.6 %   MCV 90 79 - 97 fL   MCH 30.1 26.6 - 33.0 pg   MCHC 33.4 31.5 - 35.7 g/dL   RDW 04.5 40.9 - 81.1 %   Platelets 369 150 - 450 x10E3/uL   Neutrophils 67 Not Estab. %   Lymphs 25 Not Estab. %   Monocytes 6 Not Estab. %   Eos 1 Not Estab. %   Basos 1 Not Estab. %   Neutrophils Absolute 4.7 1.4 - 7.0 x10E3/uL   Lymphocytes Absolute 1.8 0.7 - 3.1 x10E3/uL   Monocytes Absolute 0.4 0.1 - 0.9 x10E3/uL   EOS (ABSOLUTE) 0.1 0.0 - 0.4 x10E3/uL   Basophils Absolute 0.1 0.0 - 0.2 x10E3/uL   Immature Granulocytes 0 Not  Estab. %   Immature Grans (Abs) 0.0 0.0 - 0.1 x10E3/uL  Comprehensive metabolic panel  Result Value Ref Range   Glucose 88 70 - 99 mg/dL   BUN 13 6 - 24 mg/dL   Creatinine, Ser 4.74 0.57 - 1.00 mg/dL   eGFR 259 >56 LO/VFI/4.33   BUN/Creatinine Ratio 18 9 - 23   Sodium 138 134 - 144 mmol/L   Potassium 4.7 3.5 - 5.2 mmol/L   Chloride 103 96 - 106 mmol/L   CO2 21 20 - 29 mmol/L   Calcium 9.4 8.7 - 10.2 mg/dL   Total Protein 6.8 6.0 - 8.5 g/dL   Albumin 4.5 3.9 - 4.9 g/dL   Globulin, Total 2.3 1.5 - 4.5 g/dL   Albumin/Globulin Ratio 2.0 1.2 - 2.2   Bilirubin Total 0.4 0.0 - 1.2 mg/dL   Alkaline Phosphatase 61 44 - 121 IU/L   AST 19 0 - 40 IU/L   ALT 23 0 - 32 IU/L  Lipid Panel w/o Chol/HDL Ratio  Result Value Ref Range   Cholesterol, Total 252 (H) 100 - 199 mg/dL   Triglycerides 95 0 - 149 mg/dL   HDL 77 >29 mg/dL   VLDL Cholesterol Cal 16 5 - 40 mg/dL   LDL Chol Calc (NIH) 518 (H) 0 - 99 mg/dL  TSH  Result Value Ref Range   TSH 1.630 0.450 - 4.500 uIU/mL      Assessment & Plan:   Problem List Items Addressed This Visit       Hematopoietic and Hemostatic   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 Call with any concerns.         Other   Severe recurrent major depression without psychotic features (HCC)    In exacerbation but doesn't want to change meds right now. Continue current regimen and call with any concerns. Refills given that should last 3 months.       Relevant Medications   DULoxetine HCl 40 MG CPEP   LORazepam (ATIVAN) 0.5 MG tablet   Attention deficit hyperactivity disorder (ADHD)    Under good control on current regimen. Continue current regimen. Continue to monitor. Call with any concerns. Refills given for 3 months. Follow up in 3 months. Previous brand of adderall is currently on back order.        Anxiety - Primary    In exacerbation but doesn't want to change meds right now. Continue current regimen  and call with any concerns. Refills given that should last 3 months.       Relevant Medications   DULoxetine HCl 40 MG CPEP   LORazepam (ATIVAN) 0.5 MG tablet     Follow up plan: Return in about 3 months (around 03/18/2023).

## 2022-12-17 NOTE — Assessment & Plan Note (Signed)
Under good control on current regimen. Continue current regimen. Continue to monitor. Call with any concerns. Refills given for 3 months. Follow up in 3 months. Previous brand of adderall is currently on back order.

## 2022-12-17 NOTE — Assessment & Plan Note (Signed)
In exacerbation but doesn't want to change meds right now. Continue current regimen and call with any concerns. Refills given that should last 3 months.

## 2022-12-27 ENCOUNTER — Encounter: Payer: Self-pay | Admitting: Emergency Medicine

## 2022-12-27 ENCOUNTER — Emergency Department: Payer: Self-pay

## 2022-12-27 ENCOUNTER — Emergency Department
Admission: EM | Admit: 2022-12-27 | Discharge: 2022-12-27 | Disposition: A | Discharge status: Home / Self Care | Payer: Self-pay | Attending: Emergency Medicine | Admitting: Emergency Medicine

## 2022-12-27 ENCOUNTER — Other Ambulatory Visit: Payer: Self-pay

## 2022-12-27 DIAGNOSIS — N390 Urinary tract infection, site not specified: Secondary | ICD-10-CM | POA: Insufficient documentation

## 2022-12-27 LAB — URINALYSIS, ROUTINE W REFLEX MICROSCOPIC
Bilirubin Urine: NEGATIVE
Glucose, UA: NEGATIVE mg/dL
Ketones, ur: NEGATIVE mg/dL
Nitrite: NEGATIVE
Protein, ur: NEGATIVE mg/dL
Specific Gravity, Urine: 1.001 — ABNORMAL LOW (ref 1.005–1.030)
pH: 6 (ref 5.0–8.0)

## 2022-12-27 LAB — CBC
HCT: 42.6 % (ref 36.0–46.0)
Hemoglobin: 14.2 g/dL (ref 12.0–15.0)
MCH: 30.3 pg (ref 26.0–34.0)
MCHC: 33.3 g/dL (ref 30.0–36.0)
MCV: 91 fL (ref 80.0–100.0)
Platelets: 426 10*3/uL — ABNORMAL HIGH (ref 150–400)
RBC: 4.68 MIL/uL (ref 3.87–5.11)
RDW: 12.4 % (ref 11.5–15.5)
WBC: 13.3 10*3/uL — ABNORMAL HIGH (ref 4.0–10.5)
nRBC: 0 % (ref 0.0–0.2)

## 2022-12-27 LAB — COMPREHENSIVE METABOLIC PANEL
ALT: 32 U/L (ref 0–44)
AST: 24 U/L (ref 15–41)
Albumin: 4.1 g/dL (ref 3.5–5.0)
Alkaline Phosphatase: 52 U/L (ref 38–126)
Anion gap: 10 (ref 5–15)
BUN: 10 mg/dL (ref 6–20)
CO2: 25 mmol/L (ref 22–32)
Calcium: 8.8 mg/dL — ABNORMAL LOW (ref 8.9–10.3)
Chloride: 104 mmol/L (ref 98–111)
Creatinine, Ser: 0.61 mg/dL (ref 0.44–1.00)
GFR, Estimated: >60 mL/min (ref >60)
Glucose, Bld: 92 mg/dL (ref 70–99)
Potassium: 3.6 mmol/L (ref 3.5–5.1)
Sodium: 139 mmol/L (ref 135–145)
Total Bilirubin: 0.6 mg/dL (ref 0.3–1.2)
Total Protein: 7.2 g/dL (ref 6.5–8.1)

## 2022-12-27 LAB — LIPASE, BLOOD: Lipase: 31 U/L (ref 11–51)

## 2022-12-27 MED ORDER — SODIUM CHLORIDE 0.9 % IV SOLN
1.0000 g | INTRAVENOUS | Status: AC
  Administered 2022-12-27: 1 g via INTRAVENOUS
  Filled 2022-12-27: qty 10

## 2022-12-27 MED ORDER — CEFPODOXIME PROXETIL 200 MG PO TABS: 200.0000 mg | ORAL_TABLET | Freq: Two times a day (BID) | ORAL | 0 refills | Status: AC

## 2022-12-27 MED ORDER — ONDANSETRON 4 MG PO TBDP
4.0000 mg | ORAL_TABLET | Freq: Four times a day (QID) | ORAL | 0 refills | Status: AC | PRN
Start: 2022-12-27 — End: ?

## 2022-12-27 MED ORDER — HYDROCODONE-ACETAMINOPHEN 5-325 MG PO TABS: 1.0000 | ORAL_TABLET | Freq: Four times a day (QID) | ORAL | 0 refills | Status: DC | PRN

## 2022-12-27 MED ORDER — KETOROLAC TROMETHAMINE 30 MG/ML IJ SOLN
30.0000 mg | Freq: Once | INTRAMUSCULAR | Status: AC
  Administered 2022-12-27: 30 mg via INTRAVENOUS
  Filled 2022-12-27: qty 1

## 2022-12-27 NOTE — ED Notes (Signed)
See triage note  Presents with some dysuria ,lower back pain and some hematuria

## 2022-12-27 NOTE — Discharge Instructions (Addendum)
No driving within 8 hours of use of hydrocodone, use only as prescribed.

## 2022-12-27 NOTE — ED Provider Notes (Signed)
[facility] Provider Note    Event Date/Time   First MD Initiated Contact with Patient 12/27/22 [phone]     (approximate)   History   Dysuria   HPI  Karen Marsh is a 42 y.o. female with a history of nephrolithiasis and total hysterectomy as well as factor V Leiden.  Patient began experiencing hematuria with discomfort around the lower abdomen bladder area yesterday evening.  She reports that when she urinates she can see that it appears slightly bloody.  She reports is also somewhat painful to urinate and she feels like she needs to urinate frequently.  She has had no fevers or chills.  No nausea or vomiting.  Slight achiness in the right lower back  No chest pain or shortness of breath.  Denies pregnancy previous total hysterectomy   He does not know if she is ever passed a kidney stone she thinks she may have once in the past without seeing a doctor but was told that she has kidney stones on a previous CT scan from about 4 5 years ago and thinks she might be passing 1  Physical Exam   Triage Vital Signs: ED Triage Vitals  Encounter Vitals Group     BP 12/27/22 0104 (!) 123/93     Systolic BP Percentile --      Diastolic BP Percentile --      Pulse Rate 12/27/22 0104 100     Resp 12/27/22 0104 16     Temp 12/27/22 0104 98.2 F (36.8 C)     Temp Source 12/27/22 0104 Oral     SpO2 12/27/22 0104 98 %     Weight 12/27/22 0103 120 lb (54.4 kg)     Height 12/27/22 0103 5\' 1"  (1.549 m)     Head Circumference --      Peak Flow --      Pain Score 12/27/22 0103 7     Pain Loc --      Pain Education --      Exclude from Growth Chart --     Most recent vital signs: Vitals:   12/27/22 0534 12/27/22 0933  BP: (!) 113/91 110/80  Pulse: 94 80  Resp: 18 18  Temp: 98.2 F (36.8 C) 98 F (36.7 C)  SpO2: 99% 99%     General: Awake, no distress.  CV:  Good peripheral perfusion.  Normal tones and rate Resp:  Normal effort.  Clear bilateral Abd:  No  distention.  Abdomen is soft nontender nondistended throughout except she does report slight discomfort suprapubic region without rebound or guarding.  Mild tenderness to percussion of the right costovertebral angle none noted on the left Other:  Is very pleasant.  No acute distress   ED Results / Procedures / Treatments   Labs (all labs ordered are listed, but only abnormal results are displayed) Labs Reviewed  URINALYSIS, ROUTINE W REFLEX MICROSCOPIC - Abnormal; Notable for the following components:      Result Value   Color, Urine YELLOW (*)    APPearance CLEAR (*)    Specific Gravity, Urine 1.001 (*)    Hgb urine dipstick LARGE (*)    Leukocytes,Ua MODERATE (*)    Bacteria, UA RARE (*)    All other components within normal limits  CBC - Abnormal; Notable for the following components:   WBC 13.3 (*)    Platelets 426 (*)    All other components within normal limits  COMPREHENSIVE METABOLIC PANEL - Abnormal;  Notable for the following components:   Calcium 8.8 (*)    All other components within normal limits  URINE CULTURE  LIPASE, BLOOD  POC URINE PREG, ED   Urinalysis interpreted by me as notable for hematuria.  Also noted are bacteria and leukocytes sent for culture.   EKG     RADIOLOGY  CT Renal Stone Study  Result Date: 12/27/2022 CLINICAL DATA:  Abdominal/flank pain, stone suspected EXAM: CT ABDOMEN AND PELVIS WITHOUT CONTRAST TECHNIQUE: Multidetector CT imaging of the abdomen and pelvis was performed following the standard protocol without IV contrast. RADIATION DOSE REDUCTION: This exam was performed according to the departmental dose-optimization program which includes automated exposure control, adjustment of the mA and/or kV according to patient size and/or use of iterative reconstruction technique. COMPARISON:  CT scan renal stone protocol from 06/11/2017. FINDINGS: Lower chest: The lung bases are clear. No pleural effusion. The heart is normal in size. No  pericardial effusion. Hepatobiliary: The liver is normal in size. Non-cirrhotic configuration. No suspicious mass. No intrahepatic or extrahepatic bile duct dilation. No calcified gallstones. Normal gallbladder wall thickness. No pericholecystic inflammatory changes. Pancreas: Unremarkable. No pancreatic ductal dilatation or surrounding inflammatory changes. Spleen: Within normal limits. No focal lesion. Adrenals/Urinary Tract: Adrenal glands are unremarkable. No suspicious renal mass seen within the limitations of this unenhanced exam. There is a 5 mm nonobstructing calculus in the left kidney upper pole, similar to the prior study. There is a punctate nonobstructing calculus in the right kidney upper pole, also similar to the prior study. No other nephroureterolithiasis. No hydroureteronephrosis. Urinary bladder is under distended, precluding optimal assessment. However, no large mass or stones identified. No perivesical fat stranding. Stomach/Bowel: No disproportionate dilation of the small or large bowel loops. No evidence of abnormal bowel wall thickening or inflammatory changes. The appendix is unremarkable. Vascular/Lymphatic: No ascites or pneumoperitoneum. No abdominal or pelvic lymphadenopathy, by size criteria. No aneurysmal dilation of the major abdominal arteries. Reproductive: The uterus is surgically absent. No large adnexal mass. Other: There is a tiny fat containing periumbilical hernia. The soft tissues and abdominal wall are otherwise unremarkable. Musculoskeletal: No suspicious osseous lesions. IMPRESSION: 1. Bilateral nonobstructing nephrolithiasis, similar to the prior study. No hydroureteronephrosis. 2. Multiple other nonacute observations, as described above. Electronically Signed   By: Jules Schick M.D.   On: 12/27/2022 08:21      PROCEDURES:  Critical Care performed: No  Procedures   MEDICATIONS ORDERED IN ED: Medications  ketorolac (TORADOL) 30 MG/ML injection 30 mg (30 mg  Intravenous Given 12/27/22 0739)  cefTRIAXone (ROCEPHIN) 1 g in sodium chloride 0.9 % 100 mL IVPB (0 g Intravenous Stopped 12/27/22 0804)     IMPRESSION / MDM / ASSESSMENT AND PLAN / ED COURSE  I reviewed the triage vital signs and the nursing notes.                              Differential diagnosis includes, but is not limited to, urinary tract infection, hemorrhagic cystitis, nephrolithiasis, renal cyst, mass lesion etc. are all considered.  Symptoms appear to be of a urinary nature or potential renal nature.  She has a very reassuring abdominal exam except for suprapubic tenderness.  She is awake alert oriented without evidence of hemodynamic compromise.  Laboratory evaluation straits leukocytosis, urinalysis shows rare bacteria some white cells positive for leukocytes concerning for possible UTI  Discussed with patient we will treat with Toradol, also based on her urinalysis  and symptoms of urinary discomfort somewhat equivocal as to whether or not a UTI could be present.  If she has a kidney stone I think that would explain her symptoms fairly well, but some bacteria and leukocytes are present.  Will give dose of Rocephin and sent for culture.  She does not appear toxic or septic by clinical examination, white blood cell count is slightly elevated  Patient's presentation is most consistent with acute complicated illness / injury requiring diagnostic workup.  Pain is well-controlled with treatment.  On reassessment patient resting comfortably feels much improved.  Comfortable plan to plan with discharge will discharge with cefpodoxime, pain medication including hydrocodone for as needed discomfort.  Discussed careful return precautions.  Also discussed safety precautions around use of hydrocodone not driving while utilizing, to only use as prescribed.  Patient in agreement.  Return precautions and treatment recommendations and follow-up discussed with the patient who is agreeable with the  plan.         FINAL CLINICAL IMPRESSION(S) / ED DIAGNOSES   Final diagnoses:  Urinary tract infection, acute     Rx / DC Orders   ED Discharge Orders          Ordered    cefpodoxime (VANTIN) 200 MG tablet  2 times daily        12/27/22 0917    HYDROcodone-acetaminophen (NORCO/VICODIN) 5-325 MG tablet  Every 6 hours PRN        12/27/22 0917    ondansetron (ZOFRAN-ODT) 4 MG disintegrating tablet  Every 6 hours PRN        12/27/22 9604             Note:  This document was prepared using Dragon voice recognition software and may include unintentional dictation errors.   Sharyn Creamer, MD 12/27/22 1525

## 2022-12-27 NOTE — ED Triage Notes (Signed)
Patient ambulatory to triage with steady gait, without difficulty or distress noted; pt reports dysuria and hematuria; denies any abd or back pain

## 2022-12-29 LAB — URINE CULTURE: Culture: >=100000 — AB

## 2023-01-10 ENCOUNTER — Encounter: Payer: Self-pay | Admitting: Family Medicine

## 2023-01-14 ENCOUNTER — Telehealth (INDEPENDENT_AMBULATORY_CARE_PROVIDER_SITE_OTHER): Payer: Self-pay | Admitting: Family Medicine

## 2023-01-14 ENCOUNTER — Encounter: Payer: Self-pay | Admitting: Family Medicine

## 2023-01-14 ENCOUNTER — Other Ambulatory Visit: Payer: Self-pay

## 2023-01-14 DIAGNOSIS — R300 Dysuria: Secondary | ICD-10-CM

## 2023-01-14 DIAGNOSIS — N159 Renal tubulo-interstitial disease, unspecified: Secondary | ICD-10-CM

## 2023-01-14 LAB — URINALYSIS, ROUTINE W REFLEX MICROSCOPIC
Bilirubin, UA: NEGATIVE
Glucose, UA: NEGATIVE
Ketones, UA: NEGATIVE
Leukocytes,UA: NEGATIVE
Nitrite, UA: NEGATIVE
Protein,UA: NEGATIVE
Specific Gravity, UA: 1.015 (ref 1.005–1.030)
Urobilinogen, Ur: 0.2 mg/dL (ref 0.2–1.0)
pH, UA: 6.5 (ref 5.0–7.5)

## 2023-01-14 LAB — MICROSCOPIC EXAMINATION
Bacteria, UA: NONE SEEN
WBC, UA: NONE SEEN /[HPF] (ref 0–5)

## 2023-01-14 MED ORDER — METRONIDAZOLE 500 MG PO TABS: 500.0000 mg | ORAL_TABLET | Freq: Two times a day (BID) | ORAL | 0 refills | Status: DC

## 2023-01-14 MED ORDER — FLUCONAZOLE 150 MG PO TABS: 150.0000 mg | ORAL_TABLET | Freq: Every day | ORAL | 0 refills | Status: DC

## 2023-01-14 NOTE — Telephone Encounter (Signed)
Appt virtual with urine OK

## 2023-01-14 NOTE — Telephone Encounter (Signed)
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 Shela Commons @ 3:40 pm.

## 2023-01-14 NOTE — Progress Notes (Signed)
LMP 04/19/2015 (Exact Date)    Subjective:    Patient ID: Karen Marsh, female    DOB: Aug 03, 1980, 42 y.o.   MRN: [id number]  HPI: [name] is a 42 y.o. female  Chief Complaint  Patient presents with   Back Pain    Patient says she is still having burning with urination, but says it comes and goes that's associated with lower back pain on the R side. Patient says she has completed her course of treatment. Patient says she has not heard back from anyone in regards to her urine culture results. Patient says the lower back pain has been constant since yesterday.    Dysuria   URINARY SYMPTOMS Duration: 2-3 weeks Dysuria: yes Urinary frequency: no Urgency: no Small volume voids: no Symptom severity: mild Urinary incontinence: no Foul odor: no significant change Hematuria: no Abdominal pain: yes Back pain: yes Suprapubic pain/pressure: no Flank pain: no Fever:  no Vomiting: no Relief with cranberry juice: no Relief with pyridium: no Status: better- definitely not normal Previous urinary tract infection: yes Recurrent urinary tract infection: no History of sexually transmitted disease: no Vaginal discharge: no Treatments attempted: increasing fluids    Relevant past medical, surgical, family and social history reviewed and updated as indicated. Interim medical history since our last visit reviewed. Allergies and medications reviewed and updated.  Review of Systems  Constitutional: Negative.   Respiratory: Negative.    Cardiovascular: Negative.   Gastrointestinal:  Positive for abdominal pain. Negative for abdominal distention, anal bleeding, blood in stool, constipation, diarrhea, nausea, rectal pain and vomiting.  Genitourinary:  Positive for dysuria and frequency. Negative for decreased urine volume, difficulty urinating, dyspareunia, enuresis, flank pain, genital sores, hematuria, menstrual problem, pelvic pain, urgency, vaginal bleeding, vaginal discharge and  vaginal pain.    Per HPI unless specifically indicated above     Objective:    LMP 04/19/2015 (Exact Date)   Wt Readings from Last 3 Encounters:  12/27/22 120 lb (54.4 kg)  12/17/22 124 lb (56.2 kg)  08/29/22 116 lb (52.6 kg)    Physical Exam Vitals and nursing note reviewed.  Constitutional:      General: She is not in acute distress.    Appearance: Normal appearance. She is not ill-appearing, toxic-appearing or diaphoretic.  HENT:     Head: Normocephalic and atraumatic.     Right Ear: External ear normal.     Left Ear: External ear normal.     Nose: Nose normal.     Mouth/Throat:     Mouth: Mucous membranes are moist.     Pharynx: Oropharynx is clear.  Eyes:     General: No scleral icterus.       Right eye: No discharge.        Left eye: No discharge.     Conjunctiva/sclera: Conjunctivae normal.     Pupils: Pupils are equal, round, and reactive to light.  Pulmonary:     Effort: Pulmonary effort is normal. No respiratory distress.     Comments: Speaking in full sentences Musculoskeletal:        General: Normal range of motion.     Cervical back: Normal range of motion.  Skin:    Coloration: Skin is not jaundiced or pale.     Findings: No bruising, erythema, lesion or rash.  Neurological:     Mental Status: She is alert and oriented to person, place, and time. Mental status is at baseline.  Psychiatric:  Mood and Affect: Mood normal.        Behavior: Behavior normal.        Thought Content: Thought content normal.        Judgment: Judgment normal.     Results for orders placed or performed during the hospital encounter of 12/27/22  Urine Culture   Specimen: Urine, Clean Catch  Result Value Ref Range   Specimen Description      URINE, CLEAN CATCH Performed at Karen Marsh, [address] [place], [place] [address]    Special Requests      NONE Performed at Karen Marsh, [address] [place], [place] [address]    Culture  >=100,000 COLONIES/mL ESCHERICHIA COLI (A)    Report Status 12/29/2022 FINAL    Organism ID, Bacteria ESCHERICHIA COLI (A)       Susceptibility   Escherichia coli - MIC*    AMPICILLIN >=32 RESISTANT Resistant     CEFAZOLIN <=4 SENSITIVE Sensitive     CEFEPIME <=0.12 SENSITIVE Sensitive     CEFTRIAXONE <=0.25 SENSITIVE Sensitive     CIPROFLOXACIN <=0.25 SENSITIVE Sensitive     GENTAMICIN <=1 SENSITIVE Sensitive     IMIPENEM <=0.25 SENSITIVE Sensitive     NITROFURANTOIN <=16 SENSITIVE Sensitive     TRIMETH/SULFA >=320 RESISTANT Resistant     AMPICILLIN/SULBACTAM 4 SENSITIVE Sensitive     PIP/TAZO <=4 SENSITIVE Sensitive     * >=100,000 COLONIES/mL ESCHERICHIA COLI  Urinalysis, Routine w reflex microscopic -Urine, Clean Catch  Result Value Ref Range   Color, Urine YELLOW (A) YELLOW   APPearance CLEAR (A) CLEAR   Specific Gravity, Urine 1.001 (L) 1.005 - 1.030   pH 6.0 5.0 - 8.0   Glucose, UA NEGATIVE NEGATIVE mg/dL   Hgb urine dipstick LARGE (A) NEGATIVE   Bilirubin Urine NEGATIVE NEGATIVE   Ketones, ur NEGATIVE NEGATIVE mg/dL   Protein, ur NEGATIVE NEGATIVE mg/dL   Nitrite NEGATIVE NEGATIVE   Leukocytes,Ua MODERATE (A) NEGATIVE   RBC / HPF 0-5 0 - 5 RBC/hpf   WBC, UA 6-10 0 - 5 WBC/hpf   Bacteria, UA RARE (A) NONE SEEN   Squamous Epithelial / HPF 0-5 0 - 5 /HPF  CBC  Result Value Ref Range   WBC 13.3 (H) 4.0 - 10.5 K/uL   RBC 4.68 3.87 - 5.11 MIL/uL   Hemoglobin 14.2 12.0 - 15.0 g/dL   HCT 84.1 32.4 - 40.1 %   MCV 91.0 80.0 - 100.0 fL   MCH 30.3 26.0 - 34.0 pg   MCHC 33.3 30.0 - 36.0 g/dL   RDW 02.7 25.3 - 66.4 %   Platelets 426 (H) 150 - 400 K/uL   nRBC 0.0 0.0 - 0.2 %  Comprehensive metabolic panel  Result Value Ref Range   Sodium 139 135 - 145 mmol/L   Potassium 3.6 3.5 - 5.1 mmol/L   Chloride 104 98 - 111 mmol/L   CO2 25 22 - 32 mmol/L   Glucose, Bld 92 70 - 99 mg/dL   BUN 10 6 - 20 mg/dL   Creatinine, Ser 4.03 0.44 - 1.00 mg/dL   Calcium 8.8 (L) 8.9 - 10.3  mg/dL   Total Protein 7.2 6.5 - 8.1 g/dL   Albumin 4.1 3.5 - 5.0 g/dL   AST 24 15 - 41 U/L   ALT 32 0 - 44 U/L   Alkaline Phosphatase 52 38 - 126 U/L   Total Bilirubin 0.6 0.3 - 1.2 mg/dL   GFR, Estimated >47 >42  mL/min   Anion gap 10 5 - 15  Lipase, blood  Result Value Ref Range   Lipase 31 11 - 51 U/L      Assessment & Plan:   Problem List Items Addressed This Visit   None Visit Diagnoses     Dysuria    -  Primary   UA clear. Will send for culture and treat for ?BV as she is too busy to come in for swab. If not better in 24-48 hours, will come in for swab   Relevant Orders   Urine Culture   WET PREP FOR TRICH, YEAST, CLUE        Follow up plan: Return if symptoms worsen or fail to improve.   This visit was completed via video visit through MyChart due to the restrictions of the COVID-19 pandemic. All issues as above were discussed and addressed. Physical exam was done as above through visual confirmation on video through MyChart. If it was felt that the patient should be evaluated in the office, they were directed there. The patient verbally consented to this visit. Location of the patient: parking lot Location of the provider: work Those involved with this call:  Provider: Olevia Perches, DO CMA: Malen Gauze, CMA Front Desk/Registration:  Servando Snare   Time spent on call:  15 minutes with patient face to face via video conference. More than 50% of this time was spent in counseling and coordination of care. 23 minutes total spent in review of patient's record and preparation of their chart.

## 2023-03-18 ENCOUNTER — Ambulatory Visit: Payer: Self-pay | Admitting: Family Medicine

## 2023-03-19 ENCOUNTER — Encounter: Payer: Self-pay | Admitting: Family Medicine

## 2023-06-24 ENCOUNTER — Other Ambulatory Visit: Payer: Self-pay | Admitting: Family Medicine

## 2023-06-24 MED ORDER — DULOXETINE HCL 40 MG PO CPEP: 40.0000 mg | ORAL_CAPSULE | Freq: Every day | ORAL | 0 refills | Status: DC

## 2023-06-24 NOTE — Telephone Encounter (Signed)
 Requested medication (s) are due for refill today: yes  Requested medication (s) are on the active medication list: yes  Last refill:  12/17/22  Future visit scheduled: yes  Notes to clinic:  Unable to refill per protocol, cannot delegate.      Requested Prescriptions  Pending Prescriptions Disp Refills   amphetamine-dextroamphetamine (ADDERALL) 10 MG tablet 60 tablet 0    Sig: Take 1 tablet (10 mg total) by mouth 2 (two) times daily.     Not Delegated - Psychiatry:  Stimulants/ADHD Failed - 06/24/2023  8:13 AM      Failed - This refill cannot be delegated      Failed - Urine Drug Screen completed in last 360 days      Passed - Last BP in normal range    BP Readings from Last 1 Encounters:  12/27/22 110/80         Passed - Last Heart Rate in normal range    Pulse Readings from Last 1 Encounters:  12/27/22 80         Passed - Valid encounter within last 6 months    Recent Outpatient Visits           5 months ago Dysuria   [facility] [place], Megan P, DO   6 months ago Anxiety   [place] [facility] [place], [place], DO   9 months ago Anxiety   [facility] [place], [place] P, DO   1 year ago Attention deficit hyperactivity disorder (ADHD), predominantly inattentive type   [facility] [place], Megan P, DO   1 year ago Attention deficit hyperactivity disorder (ADHD), predominantly inattentive type    [facility] [place], Megan P, DO              Signed Prescriptions Disp Refills   DULoxetine HCl 40 MG CPEP 90 capsule 0    Sig: Take 1 capsule (40 mg total) by mouth daily.     Psychiatry: Antidepressants - SNRI - duloxetine Passed - 06/24/2023  8:13 AM      Passed - Cr in normal range and within 360 days    Creatinine, Ser  Date Value Ref Range Status  12/27/2022 0.61 0.44 - 1.00 mg/dL Final         Passed - eGFR is 30 or above and within 360 days     GFR calc Af Amer  Date Value Ref Range Status  06/10/2017 >60 >60 mL/min Final    Comment:    (NOTE) The eGFR has been calculated using the CKD EPI equation. This calculation has not been validated in all clinical situations. eGFR's persistently <60 mL/min signify possible Chronic Kidney Disease.    GFR, Estimated  Date Value Ref Range Status  12/27/2022 >60 >60 mL/min Final    Comment:    (NOTE) Calculated using the CKD-EPI Creatinine Equation (2021)    eGFR  Date Value Ref Range Status  08/29/2022 104 >59 mL/min/1.73 Final         Passed - Completed PHQ-2 or PHQ-9 in the last 360 days      Passed - Last BP in normal range    BP Readings from Last 1 Encounters:  12/27/22 110/80         Passed - Valid encounter within last 6 months    Recent Outpatient Visits           5 months ago Dysuria   [facility] [place], [place] P, DO   6 months ago Anxiety   [place] [facility] [place], [place], DO   9 months ago Anxiety   [place] [facility] [place], [place] P, DO   1 year ago Attention deficit hyperactivity disorder (ADHD), predominantly inattentive type   [place] [facility] [place], [place] P, DO   1 year ago Attention deficit hyperactivity disorder (ADHD), predominantly inattentive type   [facility] [place], Megan P, DO

## 2023-06-24 NOTE — Telephone Encounter (Signed)
 Copied from CRM [id number]. Topic: Clinical - Medication Refill >> Jun 21, 2023 10:31 AM Payton Doughty wrote: Most Recent Primary Care Visit:  Provider: [facility]  Department: [facility]  Visit Type: LAB  Date: 01/14/2023  Medication:  DULoxetine HCl 40 MG CPEP amphetamine-dextroamphetamine (ADDERALL) 10 MG tablet (Expired) Has the patient contacted their pharmacy? No Is this the correct pharmacy for this prescription? Yes If no, delete pharmacy and type the correct one.  This is the patient's preferred pharmacy:  [facility] [id number] [place], [place] - [address] [place] [address] Phone: [phone] Fax: [phone]   Has the prescription been filled recently? Yes  Is the patient out of the medication? Yes  Has the patient been seen for an appointment in the last year OR does the patient have an upcoming appointment? Yes  Can we respond through MyChart? Yes  Agent: Please be advised that Rx refills may take up to 3 business days. We ask that you follow-up with your pharmacy.

## 2023-06-24 NOTE — Telephone Encounter (Signed)
 Overdue for appointment but has one scheduled next month.

## 2023-06-24 NOTE — Telephone Encounter (Signed)
 Requested Prescriptions  Pending Prescriptions Disp Refills   amphetamine-dextroamphetamine (ADDERALL) 10 MG tablet 60 tablet 0    Sig: Take 1 tablet (10 mg total) by mouth 2 (two) times daily.     Not Delegated - Psychiatry:  Stimulants/ADHD Failed - 06/24/2023  8:12 AM      Failed - This refill cannot be delegated      Failed - Urine Drug Screen completed in last 360 days      Passed - Last BP in normal range    BP Readings from Last 1 Encounters:  12/27/22 110/80         Passed - Last Heart Rate in normal range    Pulse Readings from Last 1 Encounters:  12/27/22 80         Passed - Valid encounter within last 6 months    Recent Outpatient Visits           5 months ago Dysuria   [facility] [facility] [place], [place], DO   6 months ago Anxiety   [place] [facility] [place], [place], DO   9 months ago Anxiety   [facility] [place], [place] P, DO   1 year ago Attention deficit hyperactivity disorder (ADHD), predominantly inattentive type   [place] [facility] [place], Megan P, DO   1 year ago Attention deficit hyperactivity disorder (ADHD), predominantly inattentive type   [place] [facility], Megan P, DO               DULoxetine HCl 40 MG CPEP 90 capsule 0    Sig: Take 1 capsule (40 mg total) by mouth daily.     Psychiatry: Antidepressants - SNRI - duloxetine Passed - 06/24/2023  8:12 AM      Passed - Cr in normal range and within 360 days    Creatinine, Ser  Date Value Ref Range Status  12/27/2022 0.61 0.44 - 1.00 mg/dL Final         Passed - eGFR is 30 or above and within 360 days    GFR calc Af Amer  Date Value Ref Range Status  06/10/2017 >60 >60 mL/min Final    Comment:    (NOTE) The eGFR has been calculated using the CKD EPI equation. This calculation has not been validated in all clinical situations. eGFR's persistently <60 mL/min signify possible Chronic  Kidney Disease.    GFR, Estimated  Date Value Ref Range Status  12/27/2022 >60 >60 mL/min Final    Comment:    (NOTE) Calculated using the CKD-EPI Creatinine Equation (2021)    eGFR  Date Value Ref Range Status  08/29/2022 104 >59 mL/min/1.73 Final         Passed - Completed PHQ-2 or PHQ-9 in the last 360 days      Passed - Last BP in normal range    BP Readings from Last 1 Encounters:  12/27/22 110/80         Passed - Valid encounter within last 6 months    Recent Outpatient Visits           5 months ago Dysuria   [place] [facility] [place], Megan P, DO   6 months ago Anxiety   [place] [facility] [place], [place], DO   9 months ago Anxiety   [place] [facility] [place], [place] P, DO   1 year ago Attention deficit hyperactivity disorder (ADHD),  predominantly inattentive type   [place] [facility] [place], [place] P, DO   1 year ago Attention deficit hyperactivity disorder (ADHD), predominantly inattentive type   [facility] [place], Megan P, DO

## 2023-07-29 ENCOUNTER — Ambulatory Visit (INDEPENDENT_AMBULATORY_CARE_PROVIDER_SITE_OTHER): Payer: Self-pay | Admitting: Family Medicine

## 2023-07-29 ENCOUNTER — Encounter: Payer: Self-pay | Admitting: Family Medicine

## 2023-07-29 VITALS — BP 116/79 | HR 76 | Ht 61.5 in | Wt 139.6 lb

## 2023-07-29 DIAGNOSIS — F332 Major depressive disorder, recurrent severe without psychotic features: Secondary | ICD-10-CM

## 2023-07-29 DIAGNOSIS — F419 Anxiety disorder, unspecified: Secondary | ICD-10-CM

## 2023-07-29 DIAGNOSIS — F900 Attention-deficit hyperactivity disorder, predominantly inattentive type: Secondary | ICD-10-CM

## 2023-07-29 DIAGNOSIS — R232 Flushing: Secondary | ICD-10-CM

## 2023-07-29 MED ORDER — AMPHETAMINE-DEXTROAMPHETAMINE 5 MG PO TABS: 5.0000 mg | ORAL_TABLET | Freq: Every day | ORAL | 0 refills | Status: DC

## 2023-07-29 MED ORDER — AMPHETAMINE-DEXTROAMPHETAMINE 10 MG PO TABS: 10.0000 mg | ORAL_TABLET | Freq: Two times a day (BID) | ORAL | 0 refills | Status: DC

## 2023-07-29 MED ORDER — GABAPENTIN 100 MG PO CAPS: 100.0000 mg | ORAL_CAPSULE | Freq: Every day | ORAL | 3 refills | Status: DC

## 2023-07-29 MED ORDER — LORAZEPAM 0.5 MG PO TABS: 0.5000 mg | ORAL_TABLET | ORAL | 0 refills | Status: DC | PRN

## 2023-07-29 MED ORDER — DULOXETINE HCL 40 MG PO CPEP: 40.0000 mg | ORAL_CAPSULE | Freq: Every day | ORAL | 1 refills | Status: DC

## 2023-07-29 NOTE — Progress Notes (Signed)
 BP 116/79 (BP Location: Left Arm, Patient Position: Sitting, Cuff Size: Normal)   Pulse 76   Ht 5' 1.5" (1.562 m)   Wt 139 lb 9.6 oz (63.3 kg)   LMP 04/19/2015 (Exact Date)   SpO2 99%   BMI 25.95 kg/m    Subjective:    Patient ID: Karen Marsh, female    DOB: December 02, 1980, 43 y.o.   MRN: [id number]  HPI: Karen Marsh is a 43 y.o. female  Chief Complaint  Patient presents with   ADHD   Menopause    Pt has new onset of symptoms that she believes is the onset of menopause.    ADHD FOLLOW UP- feels like she has been having trouble focusing and feels like she has been taking 15mg  in the PM and that seems like it's helping ADHD status: uncontrolled Satisfied with current therapy: no Medication compliance:  excellent compliance Controlled substance contract: yes Previous psychiatry evaluation: yes Previous medications: yes    Taking meds on weekends/vacations: occasionally Work/school performance:  good Difficulty sustaining attention/completing tasks: yes Distracted by extraneous stimuli: yes Does not listen when spoken to: no  Fidgets with hands or feet: no Unable to stay in seat: no Blurts out/interrupts others: no ADHD Medication Side Effects: no    Decreased appetite: no    Headache: no    Sleeping disturbance pattern: no    Irritability: no    Rebound effects (worse than baseline) off medication: no    Anxiousness: no    Dizziness: no    Tics: no  ANXIETY/DEPRESSION Duration:stable Anxious mood: no  Excessive worrying: no Irritability: yes Sweating: no Nausea: no Palpitations:no Hyperventilation: no Panic attacks: no Agoraphobia: no  Obscessions/compulsions: no Depressed mood: yes    12/17/2022    2:16 PM 08/29/2022    9:46 AM 03/19/2022    8:57 AM 10/23/2021    3:06 PM 07/17/2021   10:21 AM  Depression screen PHQ 2/9  Decreased Interest 1 0 1 0 0  Down, Depressed, Hopeless 1 0 1 0 0  PHQ - 2 Score 2 0 2 0 0  Altered sleeping 1 0 0 1 1  Tired,  decreased energy 1 2 2 2 2   Change in appetite 0 0 1 0 1  Feeling bad or failure about yourself  1 0 2 0 0  Trouble concentrating 1 1 1 3 2   Moving slowly or fidgety/restless 1 0 1 2 0  Suicidal thoughts 0 0 0 0 0  PHQ-9 Score 7 3 9 8 6   Difficult doing work/chores Somewhat difficult Somewhat difficult Somewhat difficult Somewhat difficult       12/17/2022    2:17 PM 08/29/2022    9:46 AM 03/19/2022    8:58 AM 10/23/2021    3:06 PM  GAD 7 : Generalized Anxiety Score  Nervous, Anxious, on Edge 1 1 2 3   Control/stop worrying 0 0 2 1  Worry too much - different things 0 0 2 2  Trouble relaxing 1 1 2 3   Restless 1 1 2 1   Easily annoyed or irritable 2 1 2 3   Afraid - awful might happen 0 0 1 0  Total GAD 7 Score 5 4 13 13   Anxiety Difficulty Somewhat difficult Somewhat difficult Somewhat difficult Somewhat difficult   Anhedonia: no Weight changes: no Insomnia: no   Hypersomnia: no Fatigue/loss of energy: no Feelings of worthlessness: no Feelings of guilt: no Impaired concentration/indecisiveness: no Suicidal ideations: no  Crying spells: no Recent Stressors/Life  Changes: no   Relationship problems: no   Family stress: no     Financial stress: no    Job stress: no    Recent death/loss: no  MENOPAUSAL SYMPTOMS Duration: past few months Status exacerbated Symptom severity: moderate Hot flashes: yes Night sweats: yes Sleep disturbances: yes Vaginal dryness: no Dyspareunia:no Decreased libido: yes Emotional lability: yes Stress incontinence: yes Nipple Soreness: yes Previous HRT/pharmacotherapy: no Hysterectomy: yes Absolute Contraindications to Hormonal Therapy:     Undiagnosed vaginal bleeding: no    Breast cancer: no    Endometrial cancer: no    Coronary disease: no    Cerebrovascular disease: yes    Venous thromboembolic disease: yes   Relevant past medical, surgical, family and social history reviewed and updated as indicated. Interim medical history since  our last visit reviewed. Allergies and medications reviewed and updated.  Review of Systems  Constitutional:  Positive for diaphoresis. Negative for activity change, appetite change, chills, fatigue, fever and unexpected weight change.  Respiratory: Negative.    Cardiovascular: Negative.   Gastrointestinal: Negative.   Musculoskeletal: Negative.   Skin: Negative.   Psychiatric/Behavioral: Negative.      Per HPI unless specifically indicated above     Objective:    BP 116/79 (BP Location: Left Arm, Patient Position: Sitting, Cuff Size: Normal)   Pulse 76   Ht 5' 1.5" (1.562 m)   Wt 139 lb 9.6 oz (63.3 kg)   LMP 04/19/2015 (Exact Date)   SpO2 99%   BMI 25.95 kg/m   Wt Readings from Last 3 Encounters:  07/29/23 139 lb 9.6 oz (63.3 kg)  12/27/22 120 lb (54.4 kg)  12/17/22 124 lb (56.2 kg)    Physical Exam Vitals and nursing note reviewed.  Constitutional:      General: She is not in acute distress.    Appearance: Normal appearance. She is not ill-appearing, toxic-appearing or diaphoretic.  HENT:     Head: Normocephalic and atraumatic.     Right Ear: External ear normal.     Left Ear: External ear normal.     Nose: Nose normal.     Mouth/Throat:     Mouth: Mucous membranes are moist.     Pharynx: Oropharynx is clear.  Eyes:     General: No scleral icterus.       Right eye: No discharge.        Left eye: No discharge.     Extraocular Movements: Extraocular movements intact.     Conjunctiva/sclera: Conjunctivae normal.     Pupils: Pupils are equal, round, and reactive to light.  Cardiovascular:     Rate and Rhythm: Normal rate and regular rhythm.     Pulses: Normal pulses.     Heart sounds: Normal heart sounds. No murmur heard.    No friction rub. No gallop.  Pulmonary:     Effort: Pulmonary effort is normal. No respiratory distress.     Breath sounds: Normal breath sounds. No stridor. No wheezing, rhonchi or rales.  Chest:     Chest wall: No tenderness.   Musculoskeletal:        General: Normal range of motion.     Cervical back: Normal range of motion and neck supple.  Skin:    General: Skin is warm and dry.     Capillary Refill: Capillary refill takes less than 2 seconds.     Coloration: Skin is not jaundiced or pale.     Findings: No bruising, erythema, lesion or rash.  Neurological:  General: No focal deficit present.     Mental Status: She is alert and oriented to person, place, and time. Mental status is at baseline.  Psychiatric:        Mood and Affect: Mood normal.        Behavior: Behavior normal.        Thought Content: Thought content normal.        Judgment: Judgment normal.     Results for orders placed or performed in visit on 01/14/23  Microscopic Examination   Collection Time: 01/14/23  3:19 PM   Urine  Result Value Ref Range   WBC, UA None seen 0 - 5 /hpf   RBC, Urine 0-2 0 - 2 /hpf   Epithelial Cells (non renal) 0-10 0 - 10 /hpf   Bacteria, UA None seen None seen/Few  Urinalysis, Routine w reflex microscopic   Collection Time: 01/14/23  3:19 PM  Result Value Ref Range   Specific Gravity, UA 1.015 1.005 - 1.030   pH, UA 6.5 5.0 - 7.5   Color, UA Yellow Yellow   Appearance Ur Clear Clear   Leukocytes,UA Negative Negative   Protein,UA Negative Negative/Trace   Glucose, UA Negative Negative   Ketones, UA Negative Negative   RBC, UA Trace (A) Negative   Bilirubin, UA Negative Negative   Urobilinogen, Ur 0.2 0.2 - 1.0 mg/dL   Nitrite, UA Negative Negative   Microscopic Examination See below:       Assessment & Plan:   Problem List Items Addressed This Visit       Other   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 Continue current regimen. Continue to monitor. Call with any concerns. Refills given.       Relevant Medications   DULoxetine  HCl 40 MG CPEP   LORazepam  (ATIVAN ) 0.5 MG tablet   Attention deficit hyperactivity  disorder (ADHD)   Under good control on current regimen. Continue current regimen. Continue to monitor. Call with any concerns. Refills given for 3 months. Follow up 3 months. Will add in 5mg  PRN in the evenings for busy days.      Anxiety   Under good control on current regimen. Continue current regimen. Continue to monitor. Call with any concerns. Refills given.       Relevant Medications   DULoxetine  HCl 40 MG CPEP   LORazepam  (ATIVAN ) 0.5 MG tablet   Other Visit Diagnoses       Hot flashes       Will add in gabapentin  and recheck in 3 months. Call with any concerns. 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        Follow up plan: Return in about 3 months (around 10/28/2023).

## 2023-07-29 NOTE — Assessment & Plan Note (Signed)
 Under good control on current regimen. Continue current regimen. Continue to monitor. Call with any concerns. Refills given.

## 2023-07-29 NOTE — Assessment & Plan Note (Signed)
 Under good control on current regimen. Continue current regimen. Continue to monitor. Call with any concerns. Refills given for 3 months. Follow up 3 months. Will add in 5mg  PRN in the evenings for busy days.

## 2023-09-18 ENCOUNTER — Encounter: Payer: Self-pay | Admitting: Family Medicine

## 2023-09-22 MED ORDER — DULOXETINE HCL 20 MG PO CPEP: 40.0000 mg | ORAL_CAPSULE | Freq: Every day | ORAL | 1 refills | Status: DC

## 2023-10-28 ENCOUNTER — Ambulatory Visit (INDEPENDENT_AMBULATORY_CARE_PROVIDER_SITE_OTHER): Payer: Self-pay | Admitting: Family Medicine

## 2023-10-28 ENCOUNTER — Encounter: Payer: Self-pay | Admitting: Family Medicine

## 2023-10-28 VITALS — BP 123/87 | HR 79 | Ht 61.5 in | Wt 138.8 lb

## 2023-10-28 DIAGNOSIS — F332 Major depressive disorder, recurrent severe without psychotic features: Secondary | ICD-10-CM

## 2023-10-28 DIAGNOSIS — Z87440 Personal history of urinary (tract) infections: Secondary | ICD-10-CM

## 2023-10-28 DIAGNOSIS — F900 Attention-deficit hyperactivity disorder, predominantly inattentive type: Secondary | ICD-10-CM

## 2023-10-28 DIAGNOSIS — F419 Anxiety disorder, unspecified: Secondary | ICD-10-CM

## 2023-10-28 LAB — WET PREP FOR TRICH, YEAST, CLUE
Clue Cell Exam: NEGATIVE
Trichomonas Exam: NEGATIVE
Yeast Exam: NEGATIVE

## 2023-10-28 LAB — URINALYSIS, ROUTINE W REFLEX MICROSCOPIC
Bilirubin, UA: NEGATIVE
Glucose, UA: NEGATIVE
Ketones, UA: NEGATIVE
Leukocytes,UA: NEGATIVE
Nitrite, UA: NEGATIVE
Protein,UA: NEGATIVE
RBC, UA: NEGATIVE
Specific Gravity, UA: 1.02 (ref 1.005–1.030)
Urobilinogen, Ur: 0.2 mg/dL (ref 0.2–1.0)
pH, UA: 7 (ref 5.0–7.5)

## 2023-10-28 MED ORDER — LORAZEPAM 0.5 MG PO TABS: 0.5000 mg | ORAL_TABLET | ORAL | 0 refills | Status: DC | PRN

## 2023-10-28 MED ORDER — AMPHETAMINE-DEXTROAMPHETAMINE 10 MG PO TABS: 10.0000 mg | ORAL_TABLET | Freq: Two times a day (BID) | ORAL | 0 refills | Status: DC

## 2023-10-28 NOTE — Assessment & Plan Note (Signed)
 Under good control on current regimen. Continue current regimen. Continue to monitor. Call with any concerns. Refills given.

## 2023-10-28 NOTE — Progress Notes (Signed)
 BP 123/87   Pulse 79   Ht 5' 1.5 (1.562 m)   Wt 138 lb 12.8 oz (63 kg)   LMP 04/19/2015 (Exact Date)   SpO2 97%   BMI 25.80 kg/m    Subjective:    Patient ID: Karen Marsh, female    DOB: 1980-11-08, 43 y.o.   MRN: [id number]  HPI: Karen Marsh is a 43 y.o. female  Chief Complaint  Patient presents with   ADHD   Hot Flashes   Depression   Insomnia        Recurrent UTI   URINARY SYMPTOMS Duration: Few days Dysuria: yes Urinary frequency: yes Urgency: yes Small volume voids: yes Symptom severity: moderate Urinary incontinence: no Foul odor: no Hematuria: no Abdominal pain: no Back pain: no Suprapubic pain/pressure: no Flank pain: no Fever:  no Vomiting: no Relief with cranberry juice: no Relief with pyridium : no Status: better Previous urinary tract infection: yes Recurrent urinary tract infection: no Sexual activity: practicing safe sex History of sexually transmitted disease: no Vaginal discharge: no Treatments attempted: increasing fluids   ANXIETY/DEPRESSION Duration: chronic Status:stable Anxious mood: yes  Excessive worrying: yes Irritability: yes  Sweating: no Nausea: no Palpitations:no Hyperventilation: no Panic attacks: no Agoraphobia: no  Obscessions/compulsions: no Depressed mood: no    10/28/2023   10:56 AM 12/17/2022    2:16 PM 08/29/2022    9:46 AM 03/19/2022    8:57 AM 10/23/2021    3:06 PM  Depression screen PHQ 2/9  Decreased Interest 1 1 0 1 0  Down, Depressed, Hopeless  1 0 1 0  PHQ - 2 Score 1 2 0 2 0  Altered sleeping 2 1 0 0 1  Tired, decreased energy 2 1 2 2 2   Change in appetite 0 0 0 1 0  Feeling bad or failure about yourself  0 1 0 2 0  Trouble concentrating 1 1 1 1 3   Moving slowly or fidgety/restless 1 1 0 1 2  Suicidal thoughts 0 0 0 0 0  PHQ-9 Score 7 7 3 9 8   Difficult doing work/chores Somewhat difficult Somewhat difficult Somewhat difficult Somewhat difficult Somewhat difficult   Anhedonia:  no Weight changes: no Insomnia: no   Hypersomnia: no Fatigue/loss of energy: no Feelings of worthlessness: no Feelings of guilt: no Impaired concentration/indecisiveness: no Suicidal ideations: no  Crying spells: no Recent Stressors/Life Changes: no   Relationship problems: no   Family stress: no     Financial stress: no    Job stress: no    Recent death/loss: no  ADHD FOLLOW UP ADHD status: stable Satisfied with current therapy: yes Medication compliance:  excellent compliance Controlled substance contract: yes Previous psychiatry evaluation: yes Previous medications: yes    Taking meds on weekends/vacations: yes Work/school performance:  good Difficulty sustaining attention/completing tasks: no Distracted by extraneous stimuli: no Does not listen when spoken to: no  Fidgets with hands or feet: no Unable to stay in seat: no Blurts out/interrupts others: no ADHD Medication Side Effects: no    Decreased appetite: no    Headache: no    Sleeping disturbance pattern: no    Irritability: no    Rebound effects (worse than baseline) off medication: no    Anxiousness: no    Dizziness: no    Tics: no  Relevant past medical, surgical, family and social history reviewed and updated as indicated. Interim medical history since our last visit reviewed. Allergies and medications reviewed and updated.  Review of Systems  Constitutional: Negative.   Respiratory: Negative.    Cardiovascular: Negative.   Genitourinary:  Positive for dysuria, frequency and urgency. Negative for decreased urine volume, difficulty urinating, dyspareunia, enuresis, flank pain, genital sores, hematuria, menstrual problem, pelvic pain, vaginal bleeding, vaginal discharge and vaginal pain.  Musculoskeletal: Negative.   Neurological: Negative.   Psychiatric/Behavioral: Negative.      Per HPI unless specifically indicated above     Objective:    BP 123/87   Pulse 79   Ht 5' 1.5 (1.562 m)   Wt 138  lb 12.8 oz (63 kg)   LMP 04/19/2015 (Exact Date)   SpO2 97%   BMI 25.80 kg/m   Wt Readings from Last 3 Encounters:  10/28/23 138 lb 12.8 oz (63 kg)  07/29/23 139 lb 9.6 oz (63.3 kg)  12/27/22 120 lb (54.4 kg)    Physical Exam Vitals and nursing note reviewed.  Constitutional:      General: She is not in acute distress.    Appearance: Normal appearance. She is not ill-appearing, toxic-appearing or diaphoretic.  HENT:     Head: Normocephalic and atraumatic.     Right Ear: External ear normal.     Left Ear: External ear normal.     Nose: Nose normal.     Mouth/Throat:     Mouth: Mucous membranes are moist.     Pharynx: Oropharynx is clear.  Eyes:     General: No scleral icterus.       Right eye: No discharge.        Left eye: No discharge.     Extraocular Movements: Extraocular movements intact.     Conjunctiva/sclera: Conjunctivae normal.     Pupils: Pupils are equal, round, and reactive to light.  Cardiovascular:     Rate and Rhythm: Normal rate and regular rhythm.     Pulses: Normal pulses.     Heart sounds: Normal heart sounds. No murmur heard.    No friction rub. No gallop.  Pulmonary:     Effort: Pulmonary effort is normal. No respiratory distress.     Breath sounds: Normal breath sounds. No stridor. No wheezing, rhonchi or rales.  Chest:     Chest wall: No tenderness.  Musculoskeletal:        General: Normal range of motion.     Cervical back: Normal range of motion and neck supple.  Skin:    General: Skin is warm and dry.     Capillary Refill: Capillary refill takes less than 2 seconds.     Coloration: Skin is not jaundiced or pale.     Findings: No bruising, erythema, lesion or rash.  Neurological:     General: No focal deficit present.     Mental Status: She is alert and oriented to person, place, and time. Mental status is at baseline.  Psychiatric:        Mood and Affect: Mood normal.        Behavior: Behavior normal.        Thought Content: Thought  content normal.        Judgment: Judgment normal.     Results for orders placed or performed in visit on 10/28/23  WET PREP FOR TRICH, YEAST, CLUE   Collection Time: 10/28/23 10:57 AM   Specimen: Urine   Urine  Result Value Ref Range   Trichomonas Exam Negative Negative   Yeast Exam Negative Negative   Clue Cell Exam Negative Negative      Assessment & Plan:  Problem List Items Addressed This Visit       Other   Severe recurrent major depression without psychotic features (HCC)   Under good control on current regimen. Continue current regimen. Continue to monitor. Call with any concerns. Refills given.        Relevant Medications   LORazepam  (ATIVAN ) 0.5 MG tablet   Attention deficit hyperactivity disorder (ADHD) - Primary   Under good control on current regimen. Continue current regimen. Continue to monitor. Call with any concerns. Refills given for 3 months. Follow up 3 months.       Anxiety   Under good control on current regimen. Continue current regimen. Continue to monitor. Call with any concerns. Refills given.       Relevant Medications   LORazepam  (ATIVAN ) 0.5 MG tablet   Other Visit Diagnoses       History of UTI       UA clear. Await culture. Increase fluids. Call with any concerns.   Relevant Orders   Urine Culture   Urinalysis, Routine w reflex microscopic   WET PREP FOR TRICH, YEAST, CLUE (Completed)        Follow up plan: Return in about 3 months (around 01/28/2024) for virtual OK.

## 2023-10-28 NOTE — Assessment & Plan Note (Signed)
 Under good control on current regimen. Continue current regimen. Continue to monitor. Call with any concerns. Refills given for 3 months. Follow up 3 months.

## 2023-10-31 LAB — URINE CULTURE

## 2024-02-03 ENCOUNTER — Encounter: Payer: Self-pay | Admitting: Family Medicine

## 2024-02-03 ENCOUNTER — Telehealth: Payer: Self-pay | Admitting: Family Medicine

## 2024-02-03 VITALS — Ht 61.5 in

## 2024-02-03 DIAGNOSIS — F332 Major depressive disorder, recurrent severe without psychotic features: Secondary | ICD-10-CM

## 2024-02-03 DIAGNOSIS — F900 Attention-deficit hyperactivity disorder, predominantly inattentive type: Secondary | ICD-10-CM

## 2024-02-03 DIAGNOSIS — F419 Anxiety disorder, unspecified: Secondary | ICD-10-CM

## 2024-02-03 MED ORDER — ALBUTEROL SULFATE HFA 108 (90 BASE) MCG/ACT IN AERS: 2.0000 | INHALATION_SPRAY | RESPIRATORY_TRACT | 4 refills | Status: AC | PRN

## 2024-02-03 MED ORDER — VALACYCLOVIR HCL 1 G PO TABS: 1000.0000 mg | ORAL_TABLET | Freq: Two times a day (BID) | ORAL | 12 refills | Status: AC

## 2024-02-03 MED ORDER — AMPHETAMINE-DEXTROAMPHETAMINE 10 MG PO TABS: 10.0000 mg | ORAL_TABLET | Freq: Two times a day (BID) | ORAL | 0 refills | Status: AC

## 2024-02-03 MED ORDER — AMPHETAMINE-DEXTROAMPHETAMINE 5 MG PO TABS: 5.0000 mg | ORAL_TABLET | Freq: Every day | ORAL | 0 refills | Status: AC | PRN

## 2024-02-03 MED ORDER — DULOXETINE HCL 20 MG PO CPEP: 40.0000 mg | ORAL_CAPSULE | Freq: Every day | ORAL | 1 refills | Status: AC

## 2024-02-03 MED ORDER — LORAZEPAM 0.5 MG PO TABS: 0.5000 mg | ORAL_TABLET | ORAL | 0 refills | Status: AC | PRN

## 2024-02-03 NOTE — Assessment & Plan Note (Signed)
 Under good control on current regimen. Continue current regimen. Continue to monitor. Call with any concerns. Refills given.

## 2024-02-03 NOTE — Progress Notes (Signed)
 Ht 5' 1.5 (1.562 m)   LMP 04/19/2015 (Exact Date)   BMI 25.80 kg/m    Subjective:    Patient ID: Karen Marsh, female    DOB: 07-03-1980, 43 y.o.   MRN: [id number]  HPI: Karen Marsh is a 43 y.o. female  Chief Complaint  Patient presents with   ADHD   Anxiety   ADHD FOLLOW UP ADHD status: stable Satisfied with current therapy: yes Medication compliance:  excellent compliance Controlled substance contract: yes Previous psychiatry evaluation: no Previous medications: no    Taking meds on weekends/vacations: occasionally Work/school performance:  good Difficulty sustaining attention/completing tasks: no Distracted by extraneous stimuli: no Does not listen when spoken to: no  Fidgets with hands or feet: no Unable to stay in seat: no Blurts out/interrupts others: no ADHD Medication Side Effects: no    Decreased appetite: no    Headache: no    Sleeping disturbance pattern: no    Irritability: no    Rebound effects (worse than baseline) off medication: no    Anxiousness: no    Dizziness: no    Tics: no   ANXIETY/STRESS Duration: chronic Status:stable Anxious mood: yes  Excessive worrying: no Irritability: yes  Sweating: yes Nausea: no Palpitations:no Hyperventilation: no Panic attacks: no Agoraphobia: no  Obscessions/compulsions: no Depressed mood: no    02/03/2024   10:46 AM 10/28/2023   10:56 AM 12/17/2022    2:16 PM 08/29/2022    9:46 AM 03/19/2022    8:57 AM  Depression screen PHQ 2/9  Decreased Interest 0 1 1 0 1  Down, Depressed, Hopeless 1  1 0 1  PHQ - 2 Score 1 1 2  0 2  Altered sleeping 1 2 1  0 0  Tired, decreased energy 1 2 1 2 2   Change in appetite 0 0 0 0 1  Feeling bad or failure about yourself  0 0 1 0 2  Trouble concentrating 1 1 1 1 1   Moving slowly or fidgety/restless 0 1 1 0 1  Suicidal thoughts 0 0 0 0 0  PHQ-9 Score 4 7 7 3 9   Difficult doing work/chores Not difficult at all Somewhat difficult Somewhat difficult Somewhat  difficult Somewhat difficult      02/03/2024   10:49 AM 10/28/2023   10:56 AM 12/17/2022    2:17 PM 08/29/2022    9:46 AM  GAD 7 : Generalized Anxiety Score  Nervous, Anxious, on Edge 1 2 1 1   Control/stop worrying 0 1 0 0  Worry too much - different things 1 1 0 0  Trouble relaxing 1 2 1 1   Restless 1 2 1 1   Easily annoyed or irritable 1 2 2 1   Afraid - awful might happen 0 0 0 0  Total GAD 7 Score 5 10 5 4   Anxiety Difficulty Somewhat difficult Somewhat difficult Somewhat difficult Somewhat difficult   Anhedonia: no Weight changes: no Insomnia: yes   Hypersomnia: no Fatigue/loss of energy: yes Feelings of worthlessness: no Feelings of guilt: no Impaired concentration/indecisiveness: no Suicidal ideations: no  Crying spells: no Recent Stressors/Life Changes: no   Relationship problems: no   Family stress: no     Financial stress: no    Job stress: no    Recent death/loss: no  Relevant past medical, surgical, family and social history reviewed and updated as indicated. Interim medical history since our last visit reviewed. Allergies and medications reviewed and updated.  Review of Systems  Constitutional: Negative.   Respiratory: Negative.  Musculoskeletal: Negative.   Neurological: Negative.   Psychiatric/Behavioral: Negative.      Per HPI unless specifically indicated above     Objective:    Ht 5' 1.5 (1.562 m)   LMP 04/19/2015 (Exact Date)   BMI 25.80 kg/m   Wt Readings from Last 3 Encounters:  10/28/23 138 lb 12.8 oz (63 kg)  07/29/23 139 lb 9.6 oz (63.3 kg)  12/27/22 120 lb (54.4 kg)    Physical Exam Vitals and nursing note reviewed.  Constitutional:      General: She is not in acute distress.    Appearance: Normal appearance. She is not ill-appearing, toxic-appearing or diaphoretic.  HENT:     Head: Normocephalic and atraumatic.     Right Ear: External ear normal.     Left Ear: External ear normal.     Nose: Nose normal.     Mouth/Throat:      Mouth: Mucous membranes are moist.     Pharynx: Oropharynx is clear.  Eyes:     General: No scleral icterus.       Right eye: No discharge.        Left eye: No discharge.     Extraocular Movements: Extraocular movements intact.     Conjunctiva/sclera: Conjunctivae normal.     Pupils: Pupils are equal, round, and reactive to light.  Cardiovascular:     Rate and Rhythm: Normal rate and regular rhythm.     Pulses: Normal pulses.     Heart sounds: Normal heart sounds. No murmur heard.    No friction rub. No gallop.  Pulmonary:     Effort: Pulmonary effort is normal. No respiratory distress.     Breath sounds: Normal breath sounds. No stridor. No wheezing, rhonchi or rales.  Chest:     Chest wall: No tenderness.  Musculoskeletal:        General: Normal range of motion.     Cervical back: Normal range of motion and neck supple.  Skin:    General: Skin is warm and dry.     Capillary Refill: Capillary refill takes less than 2 seconds.     Coloration: Skin is not jaundiced or pale.     Findings: No bruising, erythema, lesion or rash.  Neurological:     General: No focal deficit present.     Mental Status: She is alert and oriented to person, place, and time. Mental status is at baseline.  Psychiatric:        Mood and Affect: Mood normal.        Behavior: Behavior normal.        Thought Content: Thought content normal.        Judgment: Judgment normal.     Results for orders placed or performed in visit on 10/28/23  Urine Culture   Collection Time: 10/28/23 10:57 AM   Specimen: Urine   UR  Result Value Ref Range   Urine Culture, Routine Final report    Organism ID, Bacteria Comment   WET PREP FOR TRICH, YEAST, CLUE   Collection Time: 10/28/23 10:57 AM   Specimen: Urine   Urine  Result Value Ref Range   Trichomonas Exam Negative Negative   Yeast Exam Negative Negative   Clue Cell Exam Negative Negative  Urinalysis, Routine w reflex microscopic   Collection Time: 10/28/23  10:57 AM  Result Value Ref Range   Specific Gravity, UA 1.020 1.005 - 1.030   pH, UA 7.0 5.0 - 7.5   Color, UA Yellow Yellow  Appearance Ur Clear Clear   Leukocytes,UA Negative Negative   Protein,UA Negative Negative/Trace   Glucose, UA Negative Negative   Ketones, UA Negative Negative   RBC, UA Negative Negative   Bilirubin, UA Negative Negative   Urobilinogen, Ur 0.2 0.2 - 1.0 mg/dL   Nitrite, UA Negative Negative   Microscopic Examination Comment       Assessment & Plan:   Problem List Items Addressed This Visit       Other   Severe recurrent major depression without psychotic features (HCC)   Under good control on current regimen. Continue current regimen. Continue to monitor. Call with any concerns. Refills given.        Relevant Medications   DULoxetine  (CYMBALTA ) 20 MG capsule   LORazepam  (ATIVAN ) 0.5 MG tablet   Attention deficit hyperactivity disorder (ADHD) - Primary   Under good control on current regimen. Continue current regimen. Continue to monitor. Call with any concerns. Refills given for 3 months. Follow up 3 months. PRN 5mg  sent in for her for long days.        Anxiety   Under good control on current regimen. Continue current regimen. Continue to monitor. Call with any concerns. Refills given.        Relevant Medications   DULoxetine  (CYMBALTA ) 20 MG capsule   LORazepam  (ATIVAN ) 0.5 MG tablet     Follow up plan: Return in about 3 months (around 05/01/2024) for virtual OK.    This visit was completed via video visit through MyChart due to the restrictions of the COVID-19 pandemic. All issues as above were discussed and addressed. Physical exam was done as above through visual confirmation on video through MyChart. If it was felt that the patient should be evaluated in the office, they were directed there. The patient verbally consented to this visit. Location of the patient: home Location of the provider: work Those involved with this call:   Provider: Duwaine Louder, DO CMA: York Fogo, CMA, Front Desk/Registration: Claretta Maiden  Time spent on call: 15 minutes with patient face to face via video conference. More than 50% of this time was spent in counseling and coordination of care. 23 minutes total spent in review of patient's record and preparation of their chart.

## 2024-02-03 NOTE — Assessment & Plan Note (Signed)
 Under good control on current regimen. Continue current regimen. Continue to monitor. Call with any concerns. Refills given for 3 months. Follow up 3 months. PRN 5mg  sent in for her for long days.

## 2024-02-07 NOTE — Progress Notes (Signed)
 Called patient and left a message for her to call back to get scheduled. 3 months, virtual OK before 05/03/24 please

## 2024-02-10 NOTE — Progress Notes (Signed)
 Called patient and left a message for her to call back to get scheduled for a virtual follow up in Jan before 05/03/24

## 2024-02-18 NOTE — Progress Notes (Signed)
 3 attempts made to schedule follow up.

## 2024-04-30 ENCOUNTER — Ambulatory Visit: Payer: Self-pay

## 2024-04-30 NOTE — Telephone Encounter (Signed)
 FYI Only or Action Required?: FYI only for provider: appointment scheduled on 06/16/23.  Patient was last seen in primary care on 02/03/2024 by Vicci Bouchard P, DO.  Called Nurse Triage reporting Tachycardia and Animal Bite.  Symptoms began several months ago.  Interventions attempted: Dietary changes.  Symptoms are: gradually worsening.  Triage Disposition: See Physician Within 24 Hours  Patient/caregiver understands and will follow disposition?: Yes     Message from [facility] S sent at 04/30/2024  1:28 PM EST  Reason for Triage: RAPID HR, FEVER, ABDOMINAL PAIN, BODY ACHES        Reason for Disposition  [1] 2 to 14 days following tick bite AND [2] fever AND [3] no rash or headache  Answer Assessment - Initial Assessment Questions Pt calling to report suspected alpha gal. Pt states she had a tick bit on the back of her L leg in August 2025. 3 weeks post bite, pt developed widespread rash with fever, elevated HR, abdominal pain and body aches x 5 days. Pt states symptoms started in October. Pt has had continued abdominal pain, itching present at site of bite. Pt has also developed aversion to red meat and dairy which causes extreme GI upset and discomfort. PT has made dietary changes, states no medications eased symptoms so trying to avoid triggers. Pt prefers to only see PCP, appt scheduled and added to wait list. PT questioned if she could have labs prior to visit. Pt states she is okay to wait until March to see PCP if soonest available but will need adderrall refill d/t missed f/u in December. Pt states she is still taking Cymbalta  and has plenty of medication to get to appt in March. If cancellation does occur, pt is off on Mondays and would prefer a Monday appt.      1. ATTACHED:  Is the tick still on the skin?  (e.g., yes, no, unsure)     No; bite occurred August 2025  2. ONSET - TICK STILL ATTACHED:  How long do you think the tick has been on your skin? (e.g., hours,  days, unsure)  Note:  Is there a recent activity (camping, hiking) where the caller may have been exposed?    N/a  3. ONSET - TICK NOT STILL ATTACHED: If the tick has been removed, how long do you think the tick was attached before you removed it? (e.g., 5 hours, 2 days). When was this?      Unsure; pt states that she was bit on the back of her L leg in August 2025. At this time rash and fever have resolved but bite is still itchy   4. LOCATION: Where is the tick bite located? (e.g., arm, leg)     Posterior L leg   5. TYPE of TICK: Is it a wood tick or a deer tick? (e.g., deer tick, wood tick; unsure)     Unsure   6. SIZE of TICK: How big is the tick? (e.g., size of poppy seed, apple seed, watermelon seed; unsure) Note: Deer ticks can be the size of a poppy seed (nymph) or an apple seed (adult).       N/a   7. ENGORGED: Did the tick look flat or engorged (full, swollen)? (e.g., flat, engorged; unsure)     N/a   8. OTHER SYMPTOMS: Do you have any other symptoms? (e.g., fever, rash, redness at bite area, red ring around bite)     Fever, widespread rash which have resolved. Continued itching at site, aversion  to red meat and diary with abdominal pain  Protocols used: Tick Bite-A-AH

## 2024-04-30 NOTE — Telephone Encounter (Signed)
 I should have a same day prior to March

## 2024-04-30 NOTE — Telephone Encounter (Signed)
 2 things- Post tick bite allergy concerns and missed ADHD f/u and no sooner appt  Pt calling to report suspected alpha gal. Pt states she had a tick bit on the back of her L leg in August 2025. 3 weeks post bite, pt developed widespread rash with fever, elevated HR, abdominal pain and body aches x 5 days. Pt states symptoms started in October. Pt has had continued abdominal pain, itching present at site of bite. Pt has also developed aversion to red meat and dairy which causes extreme GI upset and discomfort. PT has made dietary changes, states no medications eased symptoms so trying to avoid triggers. Pt prefers to only see PCP, appt scheduled and added to wait list. PT questioned if she could have labs prior to visit. Pt states she is okay to wait until March to see PCP if soonest available but will need adderrall refill d/t missed f/u in December. Pt states she is still taking Cymbalta  and has plenty of medication to get to appt in March. If cancellation does occur, pt is off on Mondays and would prefer a Monday appt.

## 2024-05-01 NOTE — Telephone Encounter (Signed)
 Appt scheduled

## 2024-05-04 ENCOUNTER — Ambulatory Visit: Payer: Self-pay | Admitting: Family Medicine

## 2024-06-15 ENCOUNTER — Ambulatory Visit: Payer: Self-pay | Admitting: Family Medicine
